# Patient Record
Sex: Female | Born: 1935 | Race: White | Hispanic: No | Smoking: Never smoker
Health system: Southern US, Community
[De-identification: ages and names within clinical notes are randomized; demographics above are authoritative.]

## PROBLEM LIST (undated history)

## (undated) DIAGNOSIS — S329XXA Fracture of unspecified parts of lumbosacral spine and pelvis, initial encounter for closed fracture: Secondary | ICD-10-CM

## (undated) DIAGNOSIS — D649 Anemia, unspecified: Secondary | ICD-10-CM

## (undated) DIAGNOSIS — N183 Chronic kidney disease, stage 3 unspecified: Secondary | ICD-10-CM

## (undated) DIAGNOSIS — S32000A Wedge compression fracture of unspecified lumbar vertebra, initial encounter for closed fracture: Secondary | ICD-10-CM

## (undated) DIAGNOSIS — I10 Essential (primary) hypertension: Secondary | ICD-10-CM

## (undated) DIAGNOSIS — F0390 Unspecified dementia without behavioral disturbance: Secondary | ICD-10-CM

## (undated) DIAGNOSIS — S22000A Wedge compression fracture of unspecified thoracic vertebra, initial encounter for closed fracture: Secondary | ICD-10-CM

## (undated) HISTORY — PX: TONSILLECTOMY: SUR1361

---

## 2005-09-04 HISTORY — PX: TOTAL HIP ARTHROPLASTY: SHX124

## 2013-09-15 DIAGNOSIS — R55 Syncope and collapse: Secondary | ICD-10-CM | POA: Diagnosis not present

## 2013-09-15 NOTE — Patient Instructions (Addendum)
PRESCRIPTION REFILL POLICY    [facility]   Statement to Patients  December 03, 2012      In an effort to ensure the large volume of patient prescription refills is processed in the most efficient and expeditious manner, we are asking our patients to assist us by calling your Pharmacy for all prescription refills, this will include also your  Mail Order Pharmacy. The pharmacy will contact our office electronically to continue the refill process.    Please do not wait until the last minute to call your pharmacy. We need at least 48 hours (2days) to fill prescriptions. We also encourage you to call your pharmacy before going to pick up your prescription to make sure it is ready.     With regard to controlled substance prescription refill requests (narcotic refills) that need to be picked up at our office, we ask your cooperation by providing us with at least 72 hours (3days) notice that you will need a refill.    We will not refill narcotic prescription refill requests after 4:00pm on any weekday, Monday through Thursday, or after 2:00pm on Fridays, or on the weekends.      We encourage everyone to explore another way of getting your prescription refill request processed using MyChart, our patient web portal through our electronic medical record system. MyChart is an efficient and effective way to communicate your medication request directly to the office and  downloadable as an app on your smart phone . MyChart also features a review functionality that allows you to view your medication list as well as leave messages for your physician. Are you ready to get connected? If so please review the attatched instructions or speak to any of our staff to get you set up right away!    Thank you so much for your cooperation. Should you have any questions please contact our Research officer, political partyractice Administrator.    The Physicians and Staff,  [facility]   This patient's chronological history has a better fit for a  micturition type syncope picture. Exam looks normal and we talked about some possible interventions, so this is less likely to occur in the future.

## 2013-09-15 NOTE — Progress Notes (Signed)
Neurology Consult      Subjective:      Kristina Potter is a 80 y.o. female Caucasian who comes in with the following focused history. She said on 20 December last month, she had been very preoccupied with Christmas in terms of meal preparation and entertaining etc. That night she was getting ready to go to bed and thought it smart and prudent that she go to the bathroom before she retired. She made it to the bathroom and on the toilet and leaned forward and fell to the floor. She said in retrospect, she felt like she was going to pass out and she called out to her son when she found that she couldn't immediately get up. When her son came in she said she felt nauseated, but there was no involuntary movement or tongue biting or loss of bowel and bladder incontinence while on the floor. Was appropriate with her cognition at the time and knew who she was where she was and mostly what had transpired. The son called EMS but they evaluated her and because of her improvement to baseline she elected to stay home. She recalls an isolated event as a teenager where she fainted and on one other occasion when she bent over she tended to topple but found it easier getting back on her feet. Has noted that she needs to sit up on the bed to get her bearings before she sets off because of some immediate lightheadedness predisposition. She does have hypertension. Is on one medicine and the dose has not changed in recent months. She's also on pain medicine. There is no background  heart disease, diabetes, respiratory disease. She does not smoke and alcohol is very seldom and never been a problem. No prior history of TIA or CVA or seizure etc...         Current Outpatient Prescriptions   Medication Sig Dispense Refill   ??? benazepril-hydrochlorothiazide (LOTENSIN HCT) 20-12.5 mg per tablet Take  by mouth daily.       ??? Calcium Carbonate-Vit D3-Min (CALTRATE 600+D PLUS MINERALS) 600 mg calcium- 400 unit tab Take  by mouth.       ???  aspirin (ST JOSEPH ASPIRIN) 81 mg chewable tablet Take 81 mg by mouth daily.       ??? acetaminophen (TYLENOL EXTRA STRENGTH) 500 mg tablet Take  by mouth every six (6) hours as needed for Pain.       ??? acetaminophen (TYLENOL ARTHRITIS PAIN) 650 mg CR tablet Take 650 mg by mouth every six (6) hours as needed for Pain.       ??? cephALEXin (KEFLEX) 500 mg capsule Take 500 mg by mouth four (4) times daily.          Allergies   Allergen Reactions   ??? Doxycycline Unknown (comments)     Past Medical History   Diagnosis Date   ??? Muscle weakness    ??? Leg swelling    ??? Joint pain    ??? Spinal stenosis       Past Surgical History   Procedure Laterality Date   ??? Hx tonsillectomy     ??? Hx hip replacement     ??? Hx orthopaedic        History     Social History   ??? Marital Status: WIDOWED     Spouse Name: N/A     Number of Children: N/A   ??? Years of Education: N/A     Occupational History   ???  Not on file.     Social History Main Topics   ??? Smoking status: Never Smoker    ??? Smokeless tobacco: Never Used   ??? Alcohol Use: 1.5 oz/week     3 Glasses of wine per week   ??? Drug Use: No   ??? Sexually Active: Not on file     Other Topics Concern   ??? Not on file     Social History Narrative   ??? No narrative on file      Family History   Problem Relation Age of Onset   ??? Stroke Father    ??? Heart Disease Paternal Grandfather       BP 152/80   Pulse 94   Temp(Src) 98.2 ??F (36.8 ??C)   Ht 5' (1.524 m)   Wt 38.51 kg (84 lb 14.4 oz)   BMI 16.58 kg/m2   SpO2 98%     Review of Systems:   A comprehensive review of systems was negative except for that written in the HPI.      Neuro Exam:     Appearance:  The patient is well developed, well nourished, provides a coherent history and is in no acute distress.   Mental Status: Oriented to time, place and person. Mood and affect appropriate.   Cranial Nerves:   Intact visual fields. Fundi are benign. PERLA, EOM's full, no nystagmus, no ptosis. Facial sensation is normal. Corneal reflexes are intact. Facial  movement is symmetric. Hearing is normal bilaterally. Palate is midline with normal sternocleidomastoid and trapezius muscles are normal. Tongue is midline.   Motor:  5/5 strength in upper and lower proximal and distal muscles. Normal bulk and tone. No fasciculations.   Reflexes:   Deep tendon reflexes 1-2+/4 and symmetrical.   Sensory:   Normal to touch, pinprick and vibration.   Gait:  Normal gait. Romberg negative.   Tremor:   No tremor noted.   Cerebellar:  No cerebellar signs present.   Neurovascular:  Normal heart sounds and regular rhythm, peripheral pulses intact, and no carotid bruits.            Assessment:   Micturition syncope. This isolated event with the details sounds like a best fit at this time. I do not sincerely believe this was a TIA or a seizure in retrospect. The patient was offered investigational testing such as head imaging and EEG if there was intense anxiety about the episode. Patient politely declined the offer.       Plan:   Revisit as needed.  Signed by :  Milana NaJOHN J Nimrod Wendt IV, MD 09/15/2013

## 2013-09-15 NOTE — Progress Notes (Signed)
New patient present today with complaint of a fall on12-20-14. Reports the fall occurred in the bathroom. Stated her son told her she did not respond for a couple of minutes.

## 2014-02-10 DIAGNOSIS — Z0000 Encounter for general adult medical examination without abnormal findings: Secondary | ICD-10-CM | POA: Diagnosis not present

## 2014-03-25 DIAGNOSIS — H26499 Other secondary cataract, unspecified eye: Secondary | ICD-10-CM | POA: Diagnosis not present

## 2014-05-14 DIAGNOSIS — M4800 Spinal stenosis, site unspecified: Secondary | ICD-10-CM | POA: Diagnosis not present

## 2014-05-22 DIAGNOSIS — E7800 Pure hypercholesterolemia, unspecified: Secondary | ICD-10-CM | POA: Diagnosis not present

## 2014-05-22 DIAGNOSIS — I10 Essential (primary) hypertension: Secondary | ICD-10-CM | POA: Diagnosis not present

## 2014-05-26 DIAGNOSIS — M2560 Stiffness of unspecified joint, not elsewhere classified: Secondary | ICD-10-CM | POA: Diagnosis not present

## 2014-05-26 DIAGNOSIS — M25559 Pain in unspecified hip: Secondary | ICD-10-CM | POA: Diagnosis not present

## 2014-05-26 DIAGNOSIS — M545 Low back pain, unspecified: Secondary | ICD-10-CM | POA: Diagnosis not present

## 2014-05-29 DIAGNOSIS — Z043 Encounter for examination and observation following other accident: Secondary | ICD-10-CM | POA: Diagnosis not present

## 2014-05-29 DIAGNOSIS — D696 Thrombocytopenia, unspecified: Secondary | ICD-10-CM | POA: Diagnosis present

## 2014-05-29 DIAGNOSIS — D709 Neutropenia, unspecified: Secondary | ICD-10-CM | POA: Diagnosis not present

## 2014-05-29 DIAGNOSIS — S0990XA Unspecified injury of head, initial encounter: Secondary | ICD-10-CM | POA: Diagnosis not present

## 2014-05-29 DIAGNOSIS — T07XXXA Unspecified multiple injuries, initial encounter: Secondary | ICD-10-CM | POA: Diagnosis not present

## 2014-05-29 DIAGNOSIS — S8290XD Unspecified fracture of unspecified lower leg, subsequent encounter for closed fracture with routine healing: Secondary | ICD-10-CM | POA: Diagnosis not present

## 2014-05-29 DIAGNOSIS — IMO0002 Reserved for concepts with insufficient information to code with codable children: Secondary | ICD-10-CM | POA: Diagnosis not present

## 2014-05-29 DIAGNOSIS — D649 Anemia, unspecified: Secondary | ICD-10-CM | POA: Diagnosis not present

## 2014-05-29 DIAGNOSIS — S82001D Unspecified fracture of right patella, subsequent encounter for closed fracture with routine healing: Secondary | ICD-10-CM | POA: Diagnosis not present

## 2014-05-29 DIAGNOSIS — M545 Low back pain, unspecified: Secondary | ICD-10-CM | POA: Diagnosis not present

## 2014-05-29 DIAGNOSIS — S82843A Displaced bimalleolar fracture of unspecified lower leg, initial encounter for closed fracture: Secondary | ICD-10-CM | POA: Diagnosis not present

## 2014-05-29 DIAGNOSIS — M549 Dorsalgia, unspecified: Secondary | ICD-10-CM | POA: Diagnosis not present

## 2014-05-29 DIAGNOSIS — R0789 Other chest pain: Secondary | ICD-10-CM | POA: Diagnosis not present

## 2014-05-29 DIAGNOSIS — R9431 Abnormal electrocardiogram [ECG] [EKG]: Secondary | ICD-10-CM | POA: Diagnosis not present

## 2014-05-29 DIAGNOSIS — E871 Hypo-osmolality and hyponatremia: Secondary | ICD-10-CM | POA: Diagnosis not present

## 2014-05-29 DIAGNOSIS — S81009A Unspecified open wound, unspecified knee, initial encounter: Secondary | ICD-10-CM | POA: Diagnosis not present

## 2014-05-29 DIAGNOSIS — K5730 Diverticulosis of large intestine without perforation or abscess without bleeding: Secondary | ICD-10-CM | POA: Diagnosis not present

## 2014-05-29 DIAGNOSIS — R488 Other symbolic dysfunctions: Secondary | ICD-10-CM | POA: Diagnosis not present

## 2014-05-29 DIAGNOSIS — R262 Difficulty in walking, not elsewhere classified: Secondary | ICD-10-CM | POA: Diagnosis not present

## 2014-05-29 DIAGNOSIS — S82009A Unspecified fracture of unspecified patella, initial encounter for closed fracture: Secondary | ICD-10-CM | POA: Diagnosis not present

## 2014-05-29 DIAGNOSIS — S8263XA Displaced fracture of lateral malleolus of unspecified fibula, initial encounter for closed fracture: Secondary | ICD-10-CM | POA: Diagnosis not present

## 2014-05-29 DIAGNOSIS — M79609 Pain in unspecified limb: Secondary | ICD-10-CM | POA: Diagnosis not present

## 2014-05-29 DIAGNOSIS — M2560 Stiffness of unspecified joint, not elsewhere classified: Secondary | ICD-10-CM | POA: Diagnosis not present

## 2014-05-29 DIAGNOSIS — S0100XA Unspecified open wound of scalp, initial encounter: Secondary | ICD-10-CM | POA: Diagnosis not present

## 2014-05-29 DIAGNOSIS — M25559 Pain in unspecified hip: Secondary | ICD-10-CM | POA: Diagnosis not present

## 2014-05-29 DIAGNOSIS — S8253XA Displaced fracture of medial malleolus of unspecified tibia, initial encounter for closed fracture: Secondary | ICD-10-CM | POA: Diagnosis not present

## 2014-05-29 DIAGNOSIS — M6281 Muscle weakness (generalized): Secondary | ICD-10-CM | POA: Diagnosis not present

## 2014-05-29 DIAGNOSIS — I499 Cardiac arrhythmia, unspecified: Secondary | ICD-10-CM | POA: Diagnosis not present

## 2014-05-29 DIAGNOSIS — I10 Essential (primary) hypertension: Secondary | ICD-10-CM | POA: Diagnosis not present

## 2014-05-29 DIAGNOSIS — T1490XA Injury, unspecified, initial encounter: Secondary | ICD-10-CM | POA: Diagnosis not present

## 2014-05-29 DIAGNOSIS — S81809A Unspecified open wound, unspecified lower leg, initial encounter: Secondary | ICD-10-CM | POA: Diagnosis not present

## 2014-05-29 DIAGNOSIS — D518 Other vitamin B12 deficiency anemias: Secondary | ICD-10-CM | POA: Diagnosis present

## 2014-05-29 DIAGNOSIS — I2119 ST elevation (STEMI) myocardial infarction involving other coronary artery of inferior wall: Secondary | ICD-10-CM | POA: Diagnosis not present

## 2014-06-02 DIAGNOSIS — S8290XD Unspecified fracture of unspecified lower leg, subsequent encounter for closed fracture with routine healing: Secondary | ICD-10-CM | POA: Diagnosis not present

## 2014-06-02 DIAGNOSIS — M6281 Muscle weakness (generalized): Secondary | ICD-10-CM | POA: Diagnosis not present

## 2014-06-02 DIAGNOSIS — S82009A Unspecified fracture of unspecified patella, initial encounter for closed fracture: Secondary | ICD-10-CM | POA: Diagnosis not present

## 2014-06-02 DIAGNOSIS — S82045D Nondisplaced comminuted fracture of left patella, subsequent encounter for closed fracture with routine healing: Secondary | ICD-10-CM | POA: Diagnosis not present

## 2014-06-02 DIAGNOSIS — S82843A Displaced bimalleolar fracture of unspecified lower leg, initial encounter for closed fracture: Secondary | ICD-10-CM | POA: Diagnosis not present

## 2014-06-02 DIAGNOSIS — D539 Nutritional anemia, unspecified: Secondary | ICD-10-CM | POA: Diagnosis not present

## 2014-06-02 DIAGNOSIS — M549 Dorsalgia, unspecified: Secondary | ICD-10-CM | POA: Diagnosis not present

## 2014-06-02 DIAGNOSIS — R262 Difficulty in walking, not elsewhere classified: Secondary | ICD-10-CM | POA: Diagnosis not present

## 2014-06-02 DIAGNOSIS — D696 Thrombocytopenia, unspecified: Secondary | ICD-10-CM | POA: Diagnosis not present

## 2014-06-02 DIAGNOSIS — D649 Anemia, unspecified: Secondary | ICD-10-CM | POA: Diagnosis not present

## 2014-06-02 DIAGNOSIS — S8290XA Unspecified fracture of unspecified lower leg, initial encounter for closed fracture: Secondary | ICD-10-CM | POA: Diagnosis not present

## 2014-06-02 DIAGNOSIS — S82842D Displaced bimalleolar fracture of left lower leg, subsequent encounter for closed fracture with routine healing: Secondary | ICD-10-CM | POA: Diagnosis not present

## 2014-06-02 DIAGNOSIS — R7611 Nonspecific reaction to tuberculin skin test without active tuberculosis: Secondary | ICD-10-CM | POA: Diagnosis not present

## 2014-06-02 DIAGNOSIS — I10 Essential (primary) hypertension: Secondary | ICD-10-CM | POA: Diagnosis not present

## 2014-06-02 DIAGNOSIS — D709 Neutropenia, unspecified: Secondary | ICD-10-CM | POA: Diagnosis not present

## 2014-06-02 DIAGNOSIS — E871 Hypo-osmolality and hyponatremia: Secondary | ICD-10-CM | POA: Diagnosis not present

## 2014-06-02 DIAGNOSIS — R488 Other symbolic dysfunctions: Secondary | ICD-10-CM | POA: Diagnosis not present

## 2014-06-02 DIAGNOSIS — S82001D Unspecified fracture of right patella, subsequent encounter for closed fracture with routine healing: Secondary | ICD-10-CM | POA: Diagnosis not present

## 2014-06-03 DIAGNOSIS — S8290XD Unspecified fracture of unspecified lower leg, subsequent encounter for closed fracture with routine healing: Secondary | ICD-10-CM | POA: Diagnosis not present

## 2014-06-04 DIAGNOSIS — S82045D Nondisplaced comminuted fracture of left patella, subsequent encounter for closed fracture with routine healing: Secondary | ICD-10-CM | POA: Diagnosis not present

## 2014-06-04 DIAGNOSIS — S82842D Displaced bimalleolar fracture of left lower leg, subsequent encounter for closed fracture with routine healing: Secondary | ICD-10-CM | POA: Diagnosis not present

## 2014-06-11 DIAGNOSIS — S8290XA Unspecified fracture of unspecified lower leg, initial encounter for closed fracture: Secondary | ICD-10-CM | POA: Diagnosis not present

## 2014-06-25 DIAGNOSIS — D539 Nutritional anemia, unspecified: Secondary | ICD-10-CM | POA: Diagnosis not present

## 2014-06-25 DIAGNOSIS — D696 Thrombocytopenia, unspecified: Secondary | ICD-10-CM | POA: Diagnosis not present

## 2014-06-30 DIAGNOSIS — S8290XA Unspecified fracture of unspecified lower leg, initial encounter for closed fracture: Secondary | ICD-10-CM | POA: Diagnosis not present

## 2014-07-02 DIAGNOSIS — S82045D Nondisplaced comminuted fracture of left patella, subsequent encounter for closed fracture with routine healing: Secondary | ICD-10-CM | POA: Diagnosis not present

## 2014-07-02 DIAGNOSIS — S82842D Displaced bimalleolar fracture of left lower leg, subsequent encounter for closed fracture with routine healing: Secondary | ICD-10-CM | POA: Diagnosis not present

## 2014-07-15 DIAGNOSIS — S82045D Nondisplaced comminuted fracture of left patella, subsequent encounter for closed fracture with routine healing: Secondary | ICD-10-CM | POA: Diagnosis not present

## 2014-07-15 DIAGNOSIS — S82842D Displaced bimalleolar fracture of left lower leg, subsequent encounter for closed fracture with routine healing: Secondary | ICD-10-CM | POA: Diagnosis not present

## 2014-07-17 DIAGNOSIS — S8290XA Unspecified fracture of unspecified lower leg, initial encounter for closed fracture: Secondary | ICD-10-CM | POA: Diagnosis not present

## 2014-08-18 DIAGNOSIS — S8262XD Displaced fracture of lateral malleolus of left fibula, subsequent encounter for closed fracture with routine healing: Secondary | ICD-10-CM | POA: Diagnosis not present

## 2014-08-18 DIAGNOSIS — Z5189 Encounter for other specified aftercare: Secondary | ICD-10-CM | POA: Diagnosis not present

## 2014-08-18 DIAGNOSIS — R2681 Unsteadiness on feet: Secondary | ICD-10-CM | POA: Diagnosis not present

## 2014-08-18 DIAGNOSIS — M6281 Muscle weakness (generalized): Secondary | ICD-10-CM | POA: Diagnosis not present

## 2014-08-18 DIAGNOSIS — S8252XD Displaced fracture of medial malleolus of left tibia, subsequent encounter for closed fracture with routine healing: Secondary | ICD-10-CM | POA: Diagnosis not present

## 2014-08-18 DIAGNOSIS — S82012D Displaced osteochondral fracture of left patella, subsequent encounter for closed fracture with routine healing: Secondary | ICD-10-CM | POA: Diagnosis not present

## 2014-08-21 DIAGNOSIS — M6281 Muscle weakness (generalized): Secondary | ICD-10-CM | POA: Diagnosis not present

## 2014-08-21 DIAGNOSIS — S82012D Displaced osteochondral fracture of left patella, subsequent encounter for closed fracture with routine healing: Secondary | ICD-10-CM | POA: Diagnosis not present

## 2014-08-21 DIAGNOSIS — R2681 Unsteadiness on feet: Secondary | ICD-10-CM | POA: Diagnosis not present

## 2014-08-21 DIAGNOSIS — Z5189 Encounter for other specified aftercare: Secondary | ICD-10-CM | POA: Diagnosis not present

## 2014-08-21 DIAGNOSIS — S8252XD Displaced fracture of medial malleolus of left tibia, subsequent encounter for closed fracture with routine healing: Secondary | ICD-10-CM | POA: Diagnosis not present

## 2014-08-21 DIAGNOSIS — S8262XD Displaced fracture of lateral malleolus of left fibula, subsequent encounter for closed fracture with routine healing: Secondary | ICD-10-CM | POA: Diagnosis not present

## 2014-08-24 DIAGNOSIS — S8262XD Displaced fracture of lateral malleolus of left fibula, subsequent encounter for closed fracture with routine healing: Secondary | ICD-10-CM | POA: Diagnosis not present

## 2014-08-24 DIAGNOSIS — M6281 Muscle weakness (generalized): Secondary | ICD-10-CM | POA: Diagnosis not present

## 2014-08-24 DIAGNOSIS — S82012D Displaced osteochondral fracture of left patella, subsequent encounter for closed fracture with routine healing: Secondary | ICD-10-CM | POA: Diagnosis not present

## 2014-08-24 DIAGNOSIS — S8252XD Displaced fracture of medial malleolus of left tibia, subsequent encounter for closed fracture with routine healing: Secondary | ICD-10-CM | POA: Diagnosis not present

## 2014-08-24 DIAGNOSIS — R2681 Unsteadiness on feet: Secondary | ICD-10-CM | POA: Diagnosis not present

## 2014-08-24 DIAGNOSIS — Z5189 Encounter for other specified aftercare: Secondary | ICD-10-CM | POA: Diagnosis not present

## 2014-08-25 DIAGNOSIS — R2681 Unsteadiness on feet: Secondary | ICD-10-CM | POA: Diagnosis not present

## 2014-08-25 DIAGNOSIS — M6281 Muscle weakness (generalized): Secondary | ICD-10-CM | POA: Diagnosis not present

## 2014-08-25 DIAGNOSIS — Z5189 Encounter for other specified aftercare: Secondary | ICD-10-CM | POA: Diagnosis not present

## 2014-08-25 DIAGNOSIS — S8262XD Displaced fracture of lateral malleolus of left fibula, subsequent encounter for closed fracture with routine healing: Secondary | ICD-10-CM | POA: Diagnosis not present

## 2014-08-25 DIAGNOSIS — S8252XD Displaced fracture of medial malleolus of left tibia, subsequent encounter for closed fracture with routine healing: Secondary | ICD-10-CM | POA: Diagnosis not present

## 2014-08-25 DIAGNOSIS — S82012D Displaced osteochondral fracture of left patella, subsequent encounter for closed fracture with routine healing: Secondary | ICD-10-CM | POA: Diagnosis not present

## 2014-08-31 DIAGNOSIS — Z5189 Encounter for other specified aftercare: Secondary | ICD-10-CM | POA: Diagnosis not present

## 2014-08-31 DIAGNOSIS — S8252XD Displaced fracture of medial malleolus of left tibia, subsequent encounter for closed fracture with routine healing: Secondary | ICD-10-CM | POA: Diagnosis not present

## 2014-08-31 DIAGNOSIS — M6281 Muscle weakness (generalized): Secondary | ICD-10-CM | POA: Diagnosis not present

## 2014-08-31 DIAGNOSIS — R2681 Unsteadiness on feet: Secondary | ICD-10-CM | POA: Diagnosis not present

## 2014-08-31 DIAGNOSIS — S82012D Displaced osteochondral fracture of left patella, subsequent encounter for closed fracture with routine healing: Secondary | ICD-10-CM | POA: Diagnosis not present

## 2014-08-31 DIAGNOSIS — S8262XD Displaced fracture of lateral malleolus of left fibula, subsequent encounter for closed fracture with routine healing: Secondary | ICD-10-CM | POA: Diagnosis not present

## 2014-09-02 DIAGNOSIS — S82012D Displaced osteochondral fracture of left patella, subsequent encounter for closed fracture with routine healing: Secondary | ICD-10-CM | POA: Diagnosis not present

## 2014-09-02 DIAGNOSIS — S8252XD Displaced fracture of medial malleolus of left tibia, subsequent encounter for closed fracture with routine healing: Secondary | ICD-10-CM | POA: Diagnosis not present

## 2014-09-02 DIAGNOSIS — R2681 Unsteadiness on feet: Secondary | ICD-10-CM | POA: Diagnosis not present

## 2014-09-02 DIAGNOSIS — M6281 Muscle weakness (generalized): Secondary | ICD-10-CM | POA: Diagnosis not present

## 2014-09-02 DIAGNOSIS — Z5189 Encounter for other specified aftercare: Secondary | ICD-10-CM | POA: Diagnosis not present

## 2014-09-02 DIAGNOSIS — S8262XD Displaced fracture of lateral malleolus of left fibula, subsequent encounter for closed fracture with routine healing: Secondary | ICD-10-CM | POA: Diagnosis not present

## 2014-09-07 DIAGNOSIS — S82012D Displaced osteochondral fracture of left patella, subsequent encounter for closed fracture with routine healing: Secondary | ICD-10-CM | POA: Diagnosis not present

## 2014-09-07 DIAGNOSIS — M6281 Muscle weakness (generalized): Secondary | ICD-10-CM | POA: Diagnosis not present

## 2014-09-07 DIAGNOSIS — R2681 Unsteadiness on feet: Secondary | ICD-10-CM | POA: Diagnosis not present

## 2014-09-07 DIAGNOSIS — Z5189 Encounter for other specified aftercare: Secondary | ICD-10-CM | POA: Diagnosis not present

## 2014-09-07 DIAGNOSIS — S8252XD Displaced fracture of medial malleolus of left tibia, subsequent encounter for closed fracture with routine healing: Secondary | ICD-10-CM | POA: Diagnosis not present

## 2014-09-07 DIAGNOSIS — S8262XD Displaced fracture of lateral malleolus of left fibula, subsequent encounter for closed fracture with routine healing: Secondary | ICD-10-CM | POA: Diagnosis not present

## 2014-09-09 DIAGNOSIS — M6281 Muscle weakness (generalized): Secondary | ICD-10-CM | POA: Diagnosis not present

## 2014-09-09 DIAGNOSIS — S8252XD Displaced fracture of medial malleolus of left tibia, subsequent encounter for closed fracture with routine healing: Secondary | ICD-10-CM | POA: Diagnosis not present

## 2014-09-09 DIAGNOSIS — S82012D Displaced osteochondral fracture of left patella, subsequent encounter for closed fracture with routine healing: Secondary | ICD-10-CM | POA: Diagnosis not present

## 2014-09-09 DIAGNOSIS — S8262XD Displaced fracture of lateral malleolus of left fibula, subsequent encounter for closed fracture with routine healing: Secondary | ICD-10-CM | POA: Diagnosis not present

## 2014-09-09 DIAGNOSIS — Z5189 Encounter for other specified aftercare: Secondary | ICD-10-CM | POA: Diagnosis not present

## 2014-09-09 DIAGNOSIS — R2681 Unsteadiness on feet: Secondary | ICD-10-CM | POA: Diagnosis not present

## 2014-09-15 DIAGNOSIS — S8262XD Displaced fracture of lateral malleolus of left fibula, subsequent encounter for closed fracture with routine healing: Secondary | ICD-10-CM | POA: Diagnosis not present

## 2014-09-15 DIAGNOSIS — S82012D Displaced osteochondral fracture of left patella, subsequent encounter for closed fracture with routine healing: Secondary | ICD-10-CM | POA: Diagnosis not present

## 2014-09-15 DIAGNOSIS — R2681 Unsteadiness on feet: Secondary | ICD-10-CM | POA: Diagnosis not present

## 2014-09-15 DIAGNOSIS — S8252XD Displaced fracture of medial malleolus of left tibia, subsequent encounter for closed fracture with routine healing: Secondary | ICD-10-CM | POA: Diagnosis not present

## 2014-09-15 DIAGNOSIS — Z5189 Encounter for other specified aftercare: Secondary | ICD-10-CM | POA: Diagnosis not present

## 2014-09-15 DIAGNOSIS — M6281 Muscle weakness (generalized): Secondary | ICD-10-CM | POA: Diagnosis not present

## 2014-09-17 DIAGNOSIS — S8252XD Displaced fracture of medial malleolus of left tibia, subsequent encounter for closed fracture with routine healing: Secondary | ICD-10-CM | POA: Diagnosis not present

## 2014-09-17 DIAGNOSIS — S8262XD Displaced fracture of lateral malleolus of left fibula, subsequent encounter for closed fracture with routine healing: Secondary | ICD-10-CM | POA: Diagnosis not present

## 2014-09-17 DIAGNOSIS — R2681 Unsteadiness on feet: Secondary | ICD-10-CM | POA: Diagnosis not present

## 2014-09-17 DIAGNOSIS — S82012D Displaced osteochondral fracture of left patella, subsequent encounter for closed fracture with routine healing: Secondary | ICD-10-CM | POA: Diagnosis not present

## 2014-09-17 DIAGNOSIS — Z5189 Encounter for other specified aftercare: Secondary | ICD-10-CM | POA: Diagnosis not present

## 2014-09-17 DIAGNOSIS — M6281 Muscle weakness (generalized): Secondary | ICD-10-CM | POA: Diagnosis not present

## 2014-09-22 DIAGNOSIS — S8252XD Displaced fracture of medial malleolus of left tibia, subsequent encounter for closed fracture with routine healing: Secondary | ICD-10-CM | POA: Diagnosis not present

## 2014-09-22 DIAGNOSIS — R2681 Unsteadiness on feet: Secondary | ICD-10-CM | POA: Diagnosis not present

## 2014-09-22 DIAGNOSIS — S82012D Displaced osteochondral fracture of left patella, subsequent encounter for closed fracture with routine healing: Secondary | ICD-10-CM | POA: Diagnosis not present

## 2014-09-22 DIAGNOSIS — Z5189 Encounter for other specified aftercare: Secondary | ICD-10-CM | POA: Diagnosis not present

## 2014-09-22 DIAGNOSIS — S8262XD Displaced fracture of lateral malleolus of left fibula, subsequent encounter for closed fracture with routine healing: Secondary | ICD-10-CM | POA: Diagnosis not present

## 2014-09-22 DIAGNOSIS — M6281 Muscle weakness (generalized): Secondary | ICD-10-CM | POA: Diagnosis not present

## 2014-09-25 DIAGNOSIS — R2681 Unsteadiness on feet: Secondary | ICD-10-CM | POA: Diagnosis not present

## 2014-09-25 DIAGNOSIS — M6281 Muscle weakness (generalized): Secondary | ICD-10-CM | POA: Diagnosis not present

## 2014-09-25 DIAGNOSIS — Z5189 Encounter for other specified aftercare: Secondary | ICD-10-CM | POA: Diagnosis not present

## 2014-09-25 DIAGNOSIS — S8262XD Displaced fracture of lateral malleolus of left fibula, subsequent encounter for closed fracture with routine healing: Secondary | ICD-10-CM | POA: Diagnosis not present

## 2014-09-25 DIAGNOSIS — S8252XD Displaced fracture of medial malleolus of left tibia, subsequent encounter for closed fracture with routine healing: Secondary | ICD-10-CM | POA: Diagnosis not present

## 2014-09-25 DIAGNOSIS — S82012D Displaced osteochondral fracture of left patella, subsequent encounter for closed fracture with routine healing: Secondary | ICD-10-CM | POA: Diagnosis not present

## 2014-09-29 DIAGNOSIS — S8252XD Displaced fracture of medial malleolus of left tibia, subsequent encounter for closed fracture with routine healing: Secondary | ICD-10-CM | POA: Diagnosis not present

## 2014-09-29 DIAGNOSIS — M6281 Muscle weakness (generalized): Secondary | ICD-10-CM | POA: Diagnosis not present

## 2014-09-29 DIAGNOSIS — Z5189 Encounter for other specified aftercare: Secondary | ICD-10-CM | POA: Diagnosis not present

## 2014-09-29 DIAGNOSIS — R2681 Unsteadiness on feet: Secondary | ICD-10-CM | POA: Diagnosis not present

## 2014-09-29 DIAGNOSIS — S82012D Displaced osteochondral fracture of left patella, subsequent encounter for closed fracture with routine healing: Secondary | ICD-10-CM | POA: Diagnosis not present

## 2014-09-29 DIAGNOSIS — S8262XD Displaced fracture of lateral malleolus of left fibula, subsequent encounter for closed fracture with routine healing: Secondary | ICD-10-CM | POA: Diagnosis not present

## 2014-10-01 DIAGNOSIS — S8262XD Displaced fracture of lateral malleolus of left fibula, subsequent encounter for closed fracture with routine healing: Secondary | ICD-10-CM | POA: Diagnosis not present

## 2014-10-01 DIAGNOSIS — Z5189 Encounter for other specified aftercare: Secondary | ICD-10-CM | POA: Diagnosis not present

## 2014-10-01 DIAGNOSIS — M6281 Muscle weakness (generalized): Secondary | ICD-10-CM | POA: Diagnosis not present

## 2014-10-01 DIAGNOSIS — S82012D Displaced osteochondral fracture of left patella, subsequent encounter for closed fracture with routine healing: Secondary | ICD-10-CM | POA: Diagnosis not present

## 2014-10-01 DIAGNOSIS — S8252XD Displaced fracture of medial malleolus of left tibia, subsequent encounter for closed fracture with routine healing: Secondary | ICD-10-CM | POA: Diagnosis not present

## 2014-10-01 DIAGNOSIS — R2681 Unsteadiness on feet: Secondary | ICD-10-CM | POA: Diagnosis not present

## 2014-10-06 DIAGNOSIS — S82012D Displaced osteochondral fracture of left patella, subsequent encounter for closed fracture with routine healing: Secondary | ICD-10-CM | POA: Diagnosis not present

## 2014-10-06 DIAGNOSIS — Z5189 Encounter for other specified aftercare: Secondary | ICD-10-CM | POA: Diagnosis not present

## 2014-10-06 DIAGNOSIS — M6281 Muscle weakness (generalized): Secondary | ICD-10-CM | POA: Diagnosis not present

## 2014-10-06 DIAGNOSIS — R2681 Unsteadiness on feet: Secondary | ICD-10-CM | POA: Diagnosis not present

## 2014-10-06 DIAGNOSIS — S8252XD Displaced fracture of medial malleolus of left tibia, subsequent encounter for closed fracture with routine healing: Secondary | ICD-10-CM | POA: Diagnosis not present

## 2014-10-06 DIAGNOSIS — S8262XD Displaced fracture of lateral malleolus of left fibula, subsequent encounter for closed fracture with routine healing: Secondary | ICD-10-CM | POA: Diagnosis not present

## 2014-10-08 DIAGNOSIS — S8252XD Displaced fracture of medial malleolus of left tibia, subsequent encounter for closed fracture with routine healing: Secondary | ICD-10-CM | POA: Diagnosis not present

## 2014-10-08 DIAGNOSIS — M6281 Muscle weakness (generalized): Secondary | ICD-10-CM | POA: Diagnosis not present

## 2014-10-08 DIAGNOSIS — R2681 Unsteadiness on feet: Secondary | ICD-10-CM | POA: Diagnosis not present

## 2014-10-08 DIAGNOSIS — S8262XD Displaced fracture of lateral malleolus of left fibula, subsequent encounter for closed fracture with routine healing: Secondary | ICD-10-CM | POA: Diagnosis not present

## 2014-10-08 DIAGNOSIS — S82012D Displaced osteochondral fracture of left patella, subsequent encounter for closed fracture with routine healing: Secondary | ICD-10-CM | POA: Diagnosis not present

## 2014-10-08 DIAGNOSIS — Z5189 Encounter for other specified aftercare: Secondary | ICD-10-CM | POA: Diagnosis not present

## 2014-10-12 DIAGNOSIS — R946 Abnormal results of thyroid function studies: Secondary | ICD-10-CM | POA: Diagnosis not present

## 2014-10-12 DIAGNOSIS — E559 Vitamin D deficiency, unspecified: Secondary | ICD-10-CM | POA: Diagnosis not present

## 2014-10-12 DIAGNOSIS — I10 Essential (primary) hypertension: Secondary | ICD-10-CM | POA: Diagnosis not present

## 2014-10-12 DIAGNOSIS — D649 Anemia, unspecified: Secondary | ICD-10-CM | POA: Diagnosis not present

## 2014-10-12 DIAGNOSIS — M4806 Spinal stenosis, lumbar region: Secondary | ICD-10-CM | POA: Diagnosis not present

## 2014-10-12 DIAGNOSIS — E538 Deficiency of other specified B group vitamins: Secondary | ICD-10-CM | POA: Diagnosis not present

## 2014-10-12 DIAGNOSIS — R011 Cardiac murmur, unspecified: Secondary | ICD-10-CM | POA: Diagnosis not present

## 2014-10-28 DIAGNOSIS — I10 Essential (primary) hypertension: Secondary | ICD-10-CM | POA: Diagnosis not present

## 2014-11-27 DIAGNOSIS — I10 Essential (primary) hypertension: Secondary | ICD-10-CM | POA: Diagnosis not present

## 2014-12-01 DIAGNOSIS — I10 Essential (primary) hypertension: Secondary | ICD-10-CM | POA: Diagnosis not present

## 2014-12-08 DIAGNOSIS — I10 Essential (primary) hypertension: Secondary | ICD-10-CM | POA: Diagnosis not present

## 2014-12-08 DIAGNOSIS — D649 Anemia, unspecified: Secondary | ICD-10-CM | POA: Diagnosis not present

## 2014-12-08 DIAGNOSIS — R634 Abnormal weight loss: Secondary | ICD-10-CM | POA: Diagnosis not present

## 2015-01-11 DIAGNOSIS — D649 Anemia, unspecified: Secondary | ICD-10-CM | POA: Diagnosis not present

## 2015-01-11 DIAGNOSIS — I10 Essential (primary) hypertension: Secondary | ICD-10-CM | POA: Diagnosis not present

## 2015-01-11 DIAGNOSIS — E871 Hypo-osmolality and hyponatremia: Secondary | ICD-10-CM | POA: Diagnosis not present

## 2015-03-09 DIAGNOSIS — M549 Dorsalgia, unspecified: Secondary | ICD-10-CM | POA: Diagnosis present

## 2015-03-09 DIAGNOSIS — R531 Weakness: Secondary | ICD-10-CM | POA: Diagnosis not present

## 2015-03-09 DIAGNOSIS — Z66 Do not resuscitate: Secondary | ICD-10-CM | POA: Diagnosis not present

## 2015-03-09 DIAGNOSIS — S72012A Unspecified intracapsular fracture of left femur, initial encounter for closed fracture: Secondary | ICD-10-CM | POA: Diagnosis not present

## 2015-03-09 DIAGNOSIS — I10 Essential (primary) hypertension: Secondary | ICD-10-CM | POA: Diagnosis present

## 2015-03-09 DIAGNOSIS — S40812A Abrasion of left upper arm, initial encounter: Secondary | ICD-10-CM | POA: Diagnosis not present

## 2015-03-09 DIAGNOSIS — E871 Hypo-osmolality and hyponatremia: Secondary | ICD-10-CM | POA: Diagnosis present

## 2015-03-09 DIAGNOSIS — Z7982 Long term (current) use of aspirin: Secondary | ICD-10-CM | POA: Diagnosis not present

## 2015-03-09 DIAGNOSIS — S40811A Abrasion of right upper arm, initial encounter: Secondary | ICD-10-CM | POA: Diagnosis not present

## 2015-03-09 DIAGNOSIS — S72112A Displaced fracture of greater trochanter of left femur, initial encounter for closed fracture: Secondary | ICD-10-CM | POA: Diagnosis not present

## 2015-03-09 DIAGNOSIS — Z96642 Presence of left artificial hip joint: Secondary | ICD-10-CM | POA: Diagnosis present

## 2015-03-09 DIAGNOSIS — Z471 Aftercare following joint replacement surgery: Secondary | ICD-10-CM | POA: Diagnosis not present

## 2015-03-09 DIAGNOSIS — R41841 Cognitive communication deficit: Secondary | ICD-10-CM | POA: Diagnosis not present

## 2015-03-09 DIAGNOSIS — Z881 Allergy status to other antibiotic agents status: Secondary | ICD-10-CM | POA: Diagnosis not present

## 2015-03-09 DIAGNOSIS — M25552 Pain in left hip: Secondary | ICD-10-CM | POA: Diagnosis not present

## 2015-03-09 DIAGNOSIS — S72009A Fracture of unspecified part of neck of unspecified femur, initial encounter for closed fracture: Secondary | ICD-10-CM | POA: Diagnosis not present

## 2015-03-09 DIAGNOSIS — S72115A Nondisplaced fracture of greater trochanter of left femur, initial encounter for closed fracture: Secondary | ICD-10-CM | POA: Diagnosis not present

## 2015-03-09 DIAGNOSIS — S72002A Fracture of unspecified part of neck of left femur, initial encounter for closed fracture: Secondary | ICD-10-CM | POA: Diagnosis not present

## 2015-03-09 DIAGNOSIS — R262 Difficulty in walking, not elsewhere classified: Secondary | ICD-10-CM | POA: Diagnosis not present

## 2015-03-09 DIAGNOSIS — G8911 Acute pain due to trauma: Secondary | ICD-10-CM | POA: Diagnosis not present

## 2015-03-09 DIAGNOSIS — M6281 Muscle weakness (generalized): Secondary | ICD-10-CM | POA: Diagnosis not present

## 2015-03-09 DIAGNOSIS — G8929 Other chronic pain: Secondary | ICD-10-CM | POA: Diagnosis present

## 2015-03-09 DIAGNOSIS — W19XXXA Unspecified fall, initial encounter: Secondary | ICD-10-CM | POA: Diagnosis not present

## 2015-03-12 DIAGNOSIS — M6281 Muscle weakness (generalized): Secondary | ICD-10-CM | POA: Diagnosis not present

## 2015-03-12 DIAGNOSIS — E871 Hypo-osmolality and hyponatremia: Secondary | ICD-10-CM | POA: Diagnosis not present

## 2015-03-12 DIAGNOSIS — R319 Hematuria, unspecified: Secondary | ICD-10-CM | POA: Diagnosis not present

## 2015-03-12 DIAGNOSIS — R41841 Cognitive communication deficit: Secondary | ICD-10-CM | POA: Diagnosis not present

## 2015-03-12 DIAGNOSIS — S72002D Fracture of unspecified part of neck of left femur, subsequent encounter for closed fracture with routine healing: Secondary | ICD-10-CM | POA: Diagnosis not present

## 2015-03-12 DIAGNOSIS — R531 Weakness: Secondary | ICD-10-CM | POA: Diagnosis not present

## 2015-03-12 DIAGNOSIS — I10 Essential (primary) hypertension: Secondary | ICD-10-CM | POA: Diagnosis not present

## 2015-03-12 DIAGNOSIS — S72002A Fracture of unspecified part of neck of left femur, initial encounter for closed fracture: Secondary | ICD-10-CM | POA: Diagnosis not present

## 2015-03-12 DIAGNOSIS — Z471 Aftercare following joint replacement surgery: Secondary | ICD-10-CM | POA: Diagnosis not present

## 2015-03-12 DIAGNOSIS — R262 Difficulty in walking, not elsewhere classified: Secondary | ICD-10-CM | POA: Diagnosis not present

## 2015-03-12 DIAGNOSIS — S72009A Fracture of unspecified part of neck of unspecified femur, initial encounter for closed fracture: Secondary | ICD-10-CM | POA: Diagnosis not present

## 2015-03-12 DIAGNOSIS — S72114A Nondisplaced fracture of greater trochanter of right femur, initial encounter for closed fracture: Secondary | ICD-10-CM | POA: Diagnosis not present

## 2015-03-12 DIAGNOSIS — M4806 Spinal stenosis, lumbar region: Secondary | ICD-10-CM | POA: Diagnosis not present

## 2015-03-12 DIAGNOSIS — D649 Anemia, unspecified: Secondary | ICD-10-CM | POA: Diagnosis not present

## 2015-03-12 DIAGNOSIS — R54 Age-related physical debility: Secondary | ICD-10-CM | POA: Diagnosis not present

## 2015-03-12 DIAGNOSIS — S72012A Unspecified intracapsular fracture of left femur, initial encounter for closed fracture: Secondary | ICD-10-CM | POA: Diagnosis not present

## 2015-03-12 DIAGNOSIS — M25552 Pain in left hip: Secondary | ICD-10-CM | POA: Diagnosis not present

## 2015-03-12 DIAGNOSIS — S72114D Nondisplaced fracture of greater trochanter of right femur, subsequent encounter for closed fracture with routine healing: Secondary | ICD-10-CM | POA: Diagnosis not present

## 2015-03-12 DIAGNOSIS — R6884 Jaw pain: Secondary | ICD-10-CM | POA: Diagnosis not present

## 2015-03-13 DIAGNOSIS — R54 Age-related physical debility: Secondary | ICD-10-CM | POA: Diagnosis not present

## 2015-03-13 DIAGNOSIS — S72114A Nondisplaced fracture of greater trochanter of right femur, initial encounter for closed fracture: Secondary | ICD-10-CM | POA: Diagnosis not present

## 2015-03-15 DIAGNOSIS — S72114D Nondisplaced fracture of greater trochanter of right femur, subsequent encounter for closed fracture with routine healing: Secondary | ICD-10-CM | POA: Diagnosis not present

## 2015-03-15 DIAGNOSIS — R54 Age-related physical debility: Secondary | ICD-10-CM | POA: Diagnosis not present

## 2015-03-17 DIAGNOSIS — S72114D Nondisplaced fracture of greater trochanter of right femur, subsequent encounter for closed fracture with routine healing: Secondary | ICD-10-CM | POA: Diagnosis not present

## 2015-03-17 DIAGNOSIS — R54 Age-related physical debility: Secondary | ICD-10-CM | POA: Diagnosis not present

## 2015-03-26 DIAGNOSIS — S72002D Fracture of unspecified part of neck of left femur, subsequent encounter for closed fracture with routine healing: Secondary | ICD-10-CM | POA: Diagnosis not present

## 2015-03-26 DIAGNOSIS — S72002A Fracture of unspecified part of neck of left femur, initial encounter for closed fracture: Secondary | ICD-10-CM | POA: Diagnosis not present

## 2015-04-02 DIAGNOSIS — R6884 Jaw pain: Secondary | ICD-10-CM | POA: Diagnosis not present

## 2015-04-05 DIAGNOSIS — R6884 Jaw pain: Secondary | ICD-10-CM | POA: Diagnosis not present

## 2015-04-08 DIAGNOSIS — M25552 Pain in left hip: Secondary | ICD-10-CM | POA: Diagnosis not present

## 2015-04-09 DIAGNOSIS — R319 Hematuria, unspecified: Secondary | ICD-10-CM | POA: Diagnosis not present

## 2015-04-14 DIAGNOSIS — E871 Hypo-osmolality and hyponatremia: Secondary | ICD-10-CM | POA: Diagnosis not present

## 2015-04-14 DIAGNOSIS — M4806 Spinal stenosis, lumbar region: Secondary | ICD-10-CM | POA: Diagnosis not present

## 2015-04-14 DIAGNOSIS — I10 Essential (primary) hypertension: Secondary | ICD-10-CM | POA: Diagnosis not present

## 2015-04-14 DIAGNOSIS — D649 Anemia, unspecified: Secondary | ICD-10-CM | POA: Diagnosis not present

## 2015-04-26 DIAGNOSIS — I10 Essential (primary) hypertension: Secondary | ICD-10-CM | POA: Diagnosis not present

## 2015-04-27 DIAGNOSIS — S72002D Fracture of unspecified part of neck of left femur, subsequent encounter for closed fracture with routine healing: Secondary | ICD-10-CM | POA: Diagnosis not present

## 2015-05-04 DIAGNOSIS — M25552 Pain in left hip: Secondary | ICD-10-CM | POA: Diagnosis not present

## 2015-05-07 DIAGNOSIS — M545 Low back pain: Secondary | ICD-10-CM | POA: Diagnosis not present

## 2015-05-07 DIAGNOSIS — T84041D Periprosthetic fracture around internal prosthetic left hip joint, subsequent encounter: Secondary | ICD-10-CM | POA: Diagnosis not present

## 2015-05-07 DIAGNOSIS — R2689 Other abnormalities of gait and mobility: Secondary | ICD-10-CM | POA: Diagnosis not present

## 2015-05-07 DIAGNOSIS — I10 Essential (primary) hypertension: Secondary | ICD-10-CM | POA: Diagnosis not present

## 2015-05-07 DIAGNOSIS — Z9181 History of falling: Secondary | ICD-10-CM | POA: Diagnosis not present

## 2015-05-08 DIAGNOSIS — R2689 Other abnormalities of gait and mobility: Secondary | ICD-10-CM | POA: Diagnosis not present

## 2015-05-08 DIAGNOSIS — M545 Low back pain: Secondary | ICD-10-CM | POA: Diagnosis not present

## 2015-05-08 DIAGNOSIS — I10 Essential (primary) hypertension: Secondary | ICD-10-CM | POA: Diagnosis not present

## 2015-05-08 DIAGNOSIS — T84041D Periprosthetic fracture around internal prosthetic left hip joint, subsequent encounter: Secondary | ICD-10-CM | POA: Diagnosis not present

## 2015-05-08 DIAGNOSIS — Z9181 History of falling: Secondary | ICD-10-CM | POA: Diagnosis not present

## 2015-05-12 DIAGNOSIS — I10 Essential (primary) hypertension: Secondary | ICD-10-CM | POA: Diagnosis not present

## 2015-05-12 DIAGNOSIS — M545 Low back pain: Secondary | ICD-10-CM | POA: Diagnosis not present

## 2015-05-12 DIAGNOSIS — R2689 Other abnormalities of gait and mobility: Secondary | ICD-10-CM | POA: Diagnosis not present

## 2015-05-12 DIAGNOSIS — Z9181 History of falling: Secondary | ICD-10-CM | POA: Diagnosis not present

## 2015-05-12 DIAGNOSIS — T84041D Periprosthetic fracture around internal prosthetic left hip joint, subsequent encounter: Secondary | ICD-10-CM | POA: Diagnosis not present

## 2015-05-14 DIAGNOSIS — I10 Essential (primary) hypertension: Secondary | ICD-10-CM | POA: Diagnosis not present

## 2015-05-14 DIAGNOSIS — R2689 Other abnormalities of gait and mobility: Secondary | ICD-10-CM | POA: Diagnosis not present

## 2015-05-14 DIAGNOSIS — Z9181 History of falling: Secondary | ICD-10-CM | POA: Diagnosis not present

## 2015-05-14 DIAGNOSIS — M545 Low back pain: Secondary | ICD-10-CM | POA: Diagnosis not present

## 2015-05-14 DIAGNOSIS — T84041D Periprosthetic fracture around internal prosthetic left hip joint, subsequent encounter: Secondary | ICD-10-CM | POA: Diagnosis not present

## 2015-05-17 DIAGNOSIS — I10 Essential (primary) hypertension: Secondary | ICD-10-CM | POA: Diagnosis not present

## 2015-05-17 DIAGNOSIS — M545 Low back pain: Secondary | ICD-10-CM | POA: Diagnosis not present

## 2015-05-17 DIAGNOSIS — T84041D Periprosthetic fracture around internal prosthetic left hip joint, subsequent encounter: Secondary | ICD-10-CM | POA: Diagnosis not present

## 2015-05-17 DIAGNOSIS — Z9181 History of falling: Secondary | ICD-10-CM | POA: Diagnosis not present

## 2015-05-17 DIAGNOSIS — R2689 Other abnormalities of gait and mobility: Secondary | ICD-10-CM | POA: Diagnosis not present

## 2015-05-19 DIAGNOSIS — R2689 Other abnormalities of gait and mobility: Secondary | ICD-10-CM | POA: Diagnosis not present

## 2015-05-19 DIAGNOSIS — Z9181 History of falling: Secondary | ICD-10-CM | POA: Diagnosis not present

## 2015-05-19 DIAGNOSIS — M545 Low back pain: Secondary | ICD-10-CM | POA: Diagnosis not present

## 2015-05-19 DIAGNOSIS — I10 Essential (primary) hypertension: Secondary | ICD-10-CM | POA: Diagnosis not present

## 2015-05-19 DIAGNOSIS — T84041D Periprosthetic fracture around internal prosthetic left hip joint, subsequent encounter: Secondary | ICD-10-CM | POA: Diagnosis not present

## 2015-05-28 DIAGNOSIS — M25552 Pain in left hip: Secondary | ICD-10-CM | POA: Diagnosis not present

## 2015-05-31 DIAGNOSIS — Z87440 Personal history of urinary (tract) infections: Secondary | ICD-10-CM | POA: Diagnosis not present

## 2015-05-31 DIAGNOSIS — S7292XD Unspecified fracture of left femur, subsequent encounter for closed fracture with routine healing: Secondary | ICD-10-CM | POA: Diagnosis not present

## 2015-05-31 DIAGNOSIS — I10 Essential (primary) hypertension: Secondary | ICD-10-CM | POA: Diagnosis not present

## 2015-05-31 DIAGNOSIS — Z7982 Long term (current) use of aspirin: Secondary | ICD-10-CM | POA: Diagnosis not present

## 2015-06-02 DIAGNOSIS — I10 Essential (primary) hypertension: Secondary | ICD-10-CM | POA: Diagnosis not present

## 2015-06-02 DIAGNOSIS — S7292XD Unspecified fracture of left femur, subsequent encounter for closed fracture with routine healing: Secondary | ICD-10-CM | POA: Diagnosis not present

## 2015-06-02 DIAGNOSIS — Z87440 Personal history of urinary (tract) infections: Secondary | ICD-10-CM | POA: Diagnosis not present

## 2015-06-02 DIAGNOSIS — Z7982 Long term (current) use of aspirin: Secondary | ICD-10-CM | POA: Diagnosis not present

## 2015-06-07 DIAGNOSIS — I10 Essential (primary) hypertension: Secondary | ICD-10-CM | POA: Diagnosis not present

## 2015-06-07 DIAGNOSIS — Z87440 Personal history of urinary (tract) infections: Secondary | ICD-10-CM | POA: Diagnosis not present

## 2015-06-07 DIAGNOSIS — Z7982 Long term (current) use of aspirin: Secondary | ICD-10-CM | POA: Diagnosis not present

## 2015-06-07 DIAGNOSIS — S7292XD Unspecified fracture of left femur, subsequent encounter for closed fracture with routine healing: Secondary | ICD-10-CM | POA: Diagnosis not present

## 2015-06-09 DIAGNOSIS — I10 Essential (primary) hypertension: Secondary | ICD-10-CM | POA: Diagnosis not present

## 2015-06-09 DIAGNOSIS — Z7982 Long term (current) use of aspirin: Secondary | ICD-10-CM | POA: Diagnosis not present

## 2015-06-09 DIAGNOSIS — E039 Hypothyroidism, unspecified: Secondary | ICD-10-CM | POA: Diagnosis not present

## 2015-06-09 DIAGNOSIS — R269 Unspecified abnormalities of gait and mobility: Secondary | ICD-10-CM | POA: Diagnosis not present

## 2015-06-09 DIAGNOSIS — S7292XD Unspecified fracture of left femur, subsequent encounter for closed fracture with routine healing: Secondary | ICD-10-CM | POA: Diagnosis not present

## 2015-06-09 DIAGNOSIS — R600 Localized edema: Secondary | ICD-10-CM | POA: Diagnosis not present

## 2015-06-09 DIAGNOSIS — M4806 Spinal stenosis, lumbar region: Secondary | ICD-10-CM | POA: Diagnosis not present

## 2015-06-09 DIAGNOSIS — D509 Iron deficiency anemia, unspecified: Secondary | ICD-10-CM | POA: Diagnosis not present

## 2015-06-09 DIAGNOSIS — Z87440 Personal history of urinary (tract) infections: Secondary | ICD-10-CM | POA: Diagnosis not present

## 2015-06-15 DIAGNOSIS — S7292XD Unspecified fracture of left femur, subsequent encounter for closed fracture with routine healing: Secondary | ICD-10-CM | POA: Diagnosis not present

## 2015-06-15 DIAGNOSIS — Z87440 Personal history of urinary (tract) infections: Secondary | ICD-10-CM | POA: Diagnosis not present

## 2015-06-15 DIAGNOSIS — Z23 Encounter for immunization: Secondary | ICD-10-CM | POA: Diagnosis not present

## 2015-06-15 DIAGNOSIS — Z7982 Long term (current) use of aspirin: Secondary | ICD-10-CM | POA: Diagnosis not present

## 2015-06-15 DIAGNOSIS — I10 Essential (primary) hypertension: Secondary | ICD-10-CM | POA: Diagnosis not present

## 2015-06-16 DIAGNOSIS — Z79899 Other long term (current) drug therapy: Secondary | ICD-10-CM | POA: Diagnosis not present

## 2015-06-17 DIAGNOSIS — Z87440 Personal history of urinary (tract) infections: Secondary | ICD-10-CM | POA: Diagnosis not present

## 2015-06-17 DIAGNOSIS — I10 Essential (primary) hypertension: Secondary | ICD-10-CM | POA: Diagnosis not present

## 2015-06-17 DIAGNOSIS — Z7982 Long term (current) use of aspirin: Secondary | ICD-10-CM | POA: Diagnosis not present

## 2015-06-17 DIAGNOSIS — S7292XD Unspecified fracture of left femur, subsequent encounter for closed fracture with routine healing: Secondary | ICD-10-CM | POA: Diagnosis not present

## 2015-06-21 DIAGNOSIS — S7292XD Unspecified fracture of left femur, subsequent encounter for closed fracture with routine healing: Secondary | ICD-10-CM | POA: Diagnosis not present

## 2015-06-21 DIAGNOSIS — I10 Essential (primary) hypertension: Secondary | ICD-10-CM | POA: Diagnosis not present

## 2015-06-21 DIAGNOSIS — Z87440 Personal history of urinary (tract) infections: Secondary | ICD-10-CM | POA: Diagnosis not present

## 2015-06-21 DIAGNOSIS — Z7982 Long term (current) use of aspirin: Secondary | ICD-10-CM | POA: Diagnosis not present

## 2015-06-24 DIAGNOSIS — S7292XD Unspecified fracture of left femur, subsequent encounter for closed fracture with routine healing: Secondary | ICD-10-CM | POA: Diagnosis not present

## 2015-06-24 DIAGNOSIS — Z7982 Long term (current) use of aspirin: Secondary | ICD-10-CM | POA: Diagnosis not present

## 2015-06-24 DIAGNOSIS — Z87440 Personal history of urinary (tract) infections: Secondary | ICD-10-CM | POA: Diagnosis not present

## 2015-06-24 DIAGNOSIS — I10 Essential (primary) hypertension: Secondary | ICD-10-CM | POA: Diagnosis not present

## 2015-06-28 DIAGNOSIS — S7292XD Unspecified fracture of left femur, subsequent encounter for closed fracture with routine healing: Secondary | ICD-10-CM | POA: Diagnosis not present

## 2015-06-28 DIAGNOSIS — Z87440 Personal history of urinary (tract) infections: Secondary | ICD-10-CM | POA: Diagnosis not present

## 2015-06-28 DIAGNOSIS — I10 Essential (primary) hypertension: Secondary | ICD-10-CM | POA: Diagnosis not present

## 2015-06-28 DIAGNOSIS — Z7982 Long term (current) use of aspirin: Secondary | ICD-10-CM | POA: Diagnosis not present

## 2015-07-01 DIAGNOSIS — I10 Essential (primary) hypertension: Secondary | ICD-10-CM | POA: Diagnosis not present

## 2015-07-01 DIAGNOSIS — Z87440 Personal history of urinary (tract) infections: Secondary | ICD-10-CM | POA: Diagnosis not present

## 2015-07-01 DIAGNOSIS — S7292XD Unspecified fracture of left femur, subsequent encounter for closed fracture with routine healing: Secondary | ICD-10-CM | POA: Diagnosis not present

## 2015-07-01 DIAGNOSIS — Z7982 Long term (current) use of aspirin: Secondary | ICD-10-CM | POA: Diagnosis not present

## 2015-07-05 DIAGNOSIS — Z7982 Long term (current) use of aspirin: Secondary | ICD-10-CM | POA: Diagnosis not present

## 2015-07-05 DIAGNOSIS — S7292XD Unspecified fracture of left femur, subsequent encounter for closed fracture with routine healing: Secondary | ICD-10-CM | POA: Diagnosis not present

## 2015-07-05 DIAGNOSIS — I10 Essential (primary) hypertension: Secondary | ICD-10-CM | POA: Diagnosis not present

## 2015-07-05 DIAGNOSIS — Z87440 Personal history of urinary (tract) infections: Secondary | ICD-10-CM | POA: Diagnosis not present

## 2015-07-07 DIAGNOSIS — S7292XD Unspecified fracture of left femur, subsequent encounter for closed fracture with routine healing: Secondary | ICD-10-CM | POA: Diagnosis not present

## 2015-07-07 DIAGNOSIS — I10 Essential (primary) hypertension: Secondary | ICD-10-CM | POA: Diagnosis not present

## 2015-07-07 DIAGNOSIS — Z7982 Long term (current) use of aspirin: Secondary | ICD-10-CM | POA: Diagnosis not present

## 2015-07-07 DIAGNOSIS — Z87440 Personal history of urinary (tract) infections: Secondary | ICD-10-CM | POA: Diagnosis not present

## 2015-07-12 DIAGNOSIS — S7292XD Unspecified fracture of left femur, subsequent encounter for closed fracture with routine healing: Secondary | ICD-10-CM | POA: Diagnosis not present

## 2015-07-12 DIAGNOSIS — Z7982 Long term (current) use of aspirin: Secondary | ICD-10-CM | POA: Diagnosis not present

## 2015-07-12 DIAGNOSIS — Z87440 Personal history of urinary (tract) infections: Secondary | ICD-10-CM | POA: Diagnosis not present

## 2015-07-12 DIAGNOSIS — I10 Essential (primary) hypertension: Secondary | ICD-10-CM | POA: Diagnosis not present

## 2015-07-14 DIAGNOSIS — Z7982 Long term (current) use of aspirin: Secondary | ICD-10-CM | POA: Diagnosis not present

## 2015-07-14 DIAGNOSIS — I10 Essential (primary) hypertension: Secondary | ICD-10-CM | POA: Diagnosis not present

## 2015-07-14 DIAGNOSIS — S7292XD Unspecified fracture of left femur, subsequent encounter for closed fracture with routine healing: Secondary | ICD-10-CM | POA: Diagnosis not present

## 2015-07-14 DIAGNOSIS — Z87440 Personal history of urinary (tract) infections: Secondary | ICD-10-CM | POA: Diagnosis not present

## 2015-07-15 DIAGNOSIS — I10 Essential (primary) hypertension: Secondary | ICD-10-CM | POA: Diagnosis not present

## 2015-07-15 DIAGNOSIS — M169 Osteoarthritis of hip, unspecified: Secondary | ICD-10-CM | POA: Diagnosis not present

## 2015-07-15 DIAGNOSIS — R609 Edema, unspecified: Secondary | ICD-10-CM | POA: Diagnosis not present

## 2015-07-16 DIAGNOSIS — R609 Edema, unspecified: Secondary | ICD-10-CM | POA: Diagnosis not present

## 2015-07-16 DIAGNOSIS — R600 Localized edema: Secondary | ICD-10-CM | POA: Diagnosis not present

## 2015-07-19 DIAGNOSIS — Z7982 Long term (current) use of aspirin: Secondary | ICD-10-CM | POA: Diagnosis not present

## 2015-07-19 DIAGNOSIS — S7292XD Unspecified fracture of left femur, subsequent encounter for closed fracture with routine healing: Secondary | ICD-10-CM | POA: Diagnosis not present

## 2015-07-19 DIAGNOSIS — Z87440 Personal history of urinary (tract) infections: Secondary | ICD-10-CM | POA: Diagnosis not present

## 2015-07-19 DIAGNOSIS — I10 Essential (primary) hypertension: Secondary | ICD-10-CM | POA: Diagnosis not present

## 2015-07-21 DIAGNOSIS — Z7982 Long term (current) use of aspirin: Secondary | ICD-10-CM | POA: Diagnosis not present

## 2015-07-21 DIAGNOSIS — Z87440 Personal history of urinary (tract) infections: Secondary | ICD-10-CM | POA: Diagnosis not present

## 2015-07-21 DIAGNOSIS — I10 Essential (primary) hypertension: Secondary | ICD-10-CM | POA: Diagnosis not present

## 2015-07-21 DIAGNOSIS — S7292XD Unspecified fracture of left femur, subsequent encounter for closed fracture with routine healing: Secondary | ICD-10-CM | POA: Diagnosis not present

## 2015-08-02 DIAGNOSIS — N390 Urinary tract infection, site not specified: Secondary | ICD-10-CM | POA: Diagnosis not present

## 2015-08-02 DIAGNOSIS — I8391 Asymptomatic varicose veins of right lower extremity: Secondary | ICD-10-CM | POA: Diagnosis not present

## 2015-08-02 DIAGNOSIS — B351 Tinea unguium: Secondary | ICD-10-CM | POA: Diagnosis not present

## 2015-08-02 DIAGNOSIS — I10 Essential (primary) hypertension: Secondary | ICD-10-CM | POA: Diagnosis not present

## 2015-10-11 DIAGNOSIS — M545 Low back pain: Secondary | ICD-10-CM | POA: Diagnosis not present

## 2015-10-15 DIAGNOSIS — Z7952 Long term (current) use of systemic steroids: Secondary | ICD-10-CM | POA: Diagnosis not present

## 2015-10-15 DIAGNOSIS — M545 Low back pain: Secondary | ICD-10-CM | POA: Diagnosis not present

## 2015-10-15 DIAGNOSIS — Z8781 Personal history of (healed) traumatic fracture: Secondary | ICD-10-CM | POA: Diagnosis not present

## 2015-10-15 DIAGNOSIS — Z7982 Long term (current) use of aspirin: Secondary | ICD-10-CM | POA: Diagnosis not present

## 2015-10-15 DIAGNOSIS — I10 Essential (primary) hypertension: Secondary | ICD-10-CM | POA: Diagnosis not present

## 2015-10-18 ENCOUNTER — Encounter (HOSPITAL_COMMUNITY): Payer: Self-pay | Admitting: Emergency Medicine

## 2015-10-18 ENCOUNTER — Observation Stay (HOSPITAL_COMMUNITY)
Admission: EM | Admit: 2015-10-18 | Discharge: 2015-10-21 | Disposition: A | Discharge status: Home / Self Care | Payer: Medicare Other | Attending: Internal Medicine | Admitting: Internal Medicine

## 2015-10-18 ENCOUNTER — Emergency Department (HOSPITAL_COMMUNITY): Payer: Medicare Other

## 2015-10-18 DIAGNOSIS — D539 Nutritional anemia, unspecified: Secondary | ICD-10-CM | POA: Insufficient documentation

## 2015-10-18 DIAGNOSIS — I10 Essential (primary) hypertension: Secondary | ICD-10-CM | POA: Diagnosis not present

## 2015-10-18 DIAGNOSIS — F05 Delirium due to known physiological condition: Secondary | ICD-10-CM | POA: Diagnosis present

## 2015-10-18 DIAGNOSIS — N179 Acute kidney failure, unspecified: Secondary | ICD-10-CM | POA: Insufficient documentation

## 2015-10-18 DIAGNOSIS — N390 Urinary tract infection, site not specified: Secondary | ICD-10-CM | POA: Diagnosis not present

## 2015-10-18 DIAGNOSIS — S3289XA Fracture of other parts of pelvis, initial encounter for closed fracture: Secondary | ICD-10-CM | POA: Diagnosis not present

## 2015-10-18 DIAGNOSIS — S32010A Wedge compression fracture of first lumbar vertebra, initial encounter for closed fracture: Secondary | ICD-10-CM

## 2015-10-18 DIAGNOSIS — B9620 Unspecified Escherichia coli [E. coli] as the cause of diseases classified elsewhere: Secondary | ICD-10-CM | POA: Insufficient documentation

## 2015-10-18 DIAGNOSIS — Z79899 Other long term (current) drug therapy: Secondary | ICD-10-CM | POA: Insufficient documentation

## 2015-10-18 DIAGNOSIS — M4800 Spinal stenosis, site unspecified: Secondary | ICD-10-CM | POA: Diagnosis present

## 2015-10-18 DIAGNOSIS — Z66 Do not resuscitate: Secondary | ICD-10-CM | POA: Diagnosis not present

## 2015-10-18 DIAGNOSIS — X58XXXA Exposure to other specified factors, initial encounter: Secondary | ICD-10-CM | POA: Insufficient documentation

## 2015-10-18 DIAGNOSIS — M8448XA Pathological fracture, other site, initial encounter for fracture: Secondary | ICD-10-CM | POA: Diagnosis not present

## 2015-10-18 DIAGNOSIS — S32502A Unspecified fracture of left pubis, initial encounter for closed fracture: Secondary | ICD-10-CM | POA: Diagnosis not present

## 2015-10-18 DIAGNOSIS — Z7982 Long term (current) use of aspirin: Secondary | ICD-10-CM | POA: Diagnosis not present

## 2015-10-18 DIAGNOSIS — S32019A Unspecified fracture of first lumbar vertebra, initial encounter for closed fracture: Secondary | ICD-10-CM

## 2015-10-18 DIAGNOSIS — F0390 Unspecified dementia without behavioral disturbance: Secondary | ICD-10-CM | POA: Diagnosis not present

## 2015-10-18 DIAGNOSIS — Z8781 Personal history of (healed) traumatic fracture: Secondary | ICD-10-CM | POA: Diagnosis not present

## 2015-10-18 DIAGNOSIS — R262 Difficulty in walking, not elsewhere classified: Secondary | ICD-10-CM | POA: Diagnosis present

## 2015-10-18 DIAGNOSIS — M545 Low back pain: Secondary | ICD-10-CM | POA: Diagnosis not present

## 2015-10-18 DIAGNOSIS — M4856XA Collapsed vertebra, not elsewhere classified, lumbar region, initial encounter for fracture: Secondary | ICD-10-CM | POA: Diagnosis not present

## 2015-10-18 DIAGNOSIS — Z96642 Presence of left artificial hip joint: Secondary | ICD-10-CM | POA: Diagnosis not present

## 2015-10-18 DIAGNOSIS — S329XXA Fracture of unspecified parts of lumbosacral spine and pelvis, initial encounter for closed fracture: Secondary | ICD-10-CM

## 2015-10-18 DIAGNOSIS — M25559 Pain in unspecified hip: Secondary | ICD-10-CM | POA: Insufficient documentation

## 2015-10-18 DIAGNOSIS — M549 Dorsalgia, unspecified: Secondary | ICD-10-CM

## 2015-10-18 DIAGNOSIS — K5900 Constipation, unspecified: Secondary | ICD-10-CM | POA: Insufficient documentation

## 2015-10-18 DIAGNOSIS — S32000A Wedge compression fracture of unspecified lumbar vertebra, initial encounter for closed fracture: Secondary | ICD-10-CM | POA: Diagnosis present

## 2015-10-18 DIAGNOSIS — Z7952 Long term (current) use of systemic steroids: Secondary | ICD-10-CM | POA: Diagnosis not present

## 2015-10-18 HISTORY — DX: Essential (primary) hypertension: I10

## 2015-10-18 LAB — BASIC METABOLIC PANEL
Anion gap: 6 (ref 5–15)
BUN: 37 mg/dL — AB (ref 6–20)
CALCIUM: 8.4 mg/dL — AB (ref 8.9–10.3)
CO2: 24 mmol/L (ref 22–32)
Chloride: 113 mmol/L — ABNORMAL HIGH (ref 101–111)
Creatinine, Ser: 0.69 mg/dL (ref 0.44–1.00)
GFR calc Af Amer: >60 mL/min (ref >60)
GLUCOSE: 108 mg/dL — AB (ref 65–99)
POTASSIUM: 3.9 mmol/L (ref 3.5–5.1)
Sodium: 143 mmol/L (ref 135–145)

## 2015-10-18 LAB — CBC WITH DIFFERENTIAL/PLATELET
BASOS ABS: 0 10*3/uL (ref 0.0–0.1)
Basophils Relative: 0 %
EOS PCT: 1 %
Eosinophils Absolute: 0.1 10*3/uL (ref 0.0–0.7)
HEMATOCRIT: 28 % — AB (ref 36.0–46.0)
Hemoglobin: 9.2 g/dL — ABNORMAL LOW (ref 12.0–15.0)
LYMPHS PCT: 8 %
Lymphs Abs: 0.6 10*3/uL — ABNORMAL LOW (ref 0.7–4.0)
MCH: 32.6 pg (ref 26.0–34.0)
MCHC: 32.9 g/dL (ref 30.0–36.0)
MCV: 99.3 fL (ref 78.0–100.0)
MONO ABS: 0.5 10*3/uL (ref 0.1–1.0)
MONOS PCT: 7 %
Neutro Abs: 5.8 10*3/uL (ref 1.7–7.7)
Neutrophils Relative %: 84 %
PLATELETS: 181 10*3/uL (ref 150–400)
RBC: 2.82 MIL/uL — ABNORMAL LOW (ref 3.87–5.11)
RDW: 13.9 % (ref 11.5–15.5)
WBC: 6.9 10*3/uL (ref 4.0–10.5)

## 2015-10-18 LAB — URINALYSIS, ROUTINE W REFLEX MICROSCOPIC
BILIRUBIN URINE: NEGATIVE
GLUCOSE, UA: NEGATIVE mg/dL
HGB URINE DIPSTICK: NEGATIVE
KETONES UR: NEGATIVE mg/dL
Nitrite: POSITIVE — AB
PROTEIN: 100 mg/dL — AB
Specific Gravity, Urine: 1.028 (ref 1.005–1.030)
pH: 6 (ref 5.0–8.0)

## 2015-10-18 LAB — I-STAT CHEM 8, ED
BUN: 34 mg/dL — ABNORMAL HIGH (ref 6–20)
CALCIUM ION: 1.22 mmol/L (ref 1.13–1.30)
CREATININE: 0.7 mg/dL (ref 0.44–1.00)
Chloride: 109 mmol/L (ref 101–111)
GLUCOSE: 102 mg/dL — AB (ref 65–99)
HCT: 29 % — ABNORMAL LOW (ref 36.0–46.0)
HEMOGLOBIN: 9.9 g/dL — AB (ref 12.0–15.0)
POTASSIUM: 3.8 mmol/L (ref 3.5–5.1)
Sodium: 144 mmol/L (ref 135–145)
TCO2: 24 mmol/L (ref 0–100)

## 2015-10-18 LAB — URINE MICROSCOPIC-ADD ON

## 2015-10-18 MED ORDER — METOPROLOL SUCCINATE ER 25 MG PO TB24
25.0000 mg | ORAL_TABLET | Freq: Every day | ORAL | Status: DC
  Administered 2015-10-19 – 2015-10-21 (×3): 25 mg via ORAL
  Filled 2015-10-18 (×3): qty 1

## 2015-10-18 MED ORDER — ACETAMINOPHEN 325 MG PO TABS
650.0000 mg | ORAL_TABLET | Freq: Four times a day (QID) | ORAL | Status: DC | PRN
  Administered 2015-10-18 – 2015-10-21 (×3): 650 mg via ORAL
  Filled 2015-10-18 (×3): qty 2

## 2015-10-18 MED ORDER — ENOXAPARIN SODIUM 30 MG/0.3ML ~~LOC~~ SOLN
30.0000 mg | SUBCUTANEOUS | Status: DC
  Administered 2015-10-18 – 2015-10-19 (×2): 30 mg via SUBCUTANEOUS
  Filled 2015-10-18 (×3): qty 0.3

## 2015-10-18 MED ORDER — ONDANSETRON HCL 4 MG/2ML IJ SOLN
4.0000 mg | Freq: Once | INTRAMUSCULAR | Status: AC
  Administered 2015-10-18: 4 mg via INTRAVENOUS
  Filled 2015-10-18: qty 2

## 2015-10-18 MED ORDER — CYCLOBENZAPRINE HCL 5 MG PO TABS
5.0000 mg | ORAL_TABLET | Freq: Three times a day (TID) | ORAL | Status: DC | PRN
  Administered 2015-10-20: 5 mg via ORAL
  Filled 2015-10-18: qty 1
  Filled 2015-10-18: qty 2

## 2015-10-18 MED ORDER — SODIUM CHLORIDE 0.9 % IV SOLN
INTRAVENOUS | Status: DC
  Administered 2015-10-18 – 2015-10-21 (×4): via INTRAVENOUS

## 2015-10-18 MED ORDER — FENTANYL CITRATE (PF) 100 MCG/2ML IJ SOLN
25.0000 ug | INTRAMUSCULAR | Status: DC | PRN
  Administered 2015-10-18 (×2): 25 ug via INTRAVENOUS
  Filled 2015-10-18 (×2): qty 2

## 2015-10-18 MED ORDER — OXYCODONE HCL 5 MG PO TABS
5.0000 mg | ORAL_TABLET | ORAL | Status: DC | PRN
  Administered 2015-10-18 – 2015-10-21 (×6): 5 mg via ORAL
  Filled 2015-10-18 (×6): qty 1

## 2015-10-18 MED ORDER — ACETAMINOPHEN 650 MG RE SUPP: 650.0000 mg | Freq: Four times a day (QID) | RECTAL | Status: DC | PRN

## 2015-10-18 MED ORDER — ASPIRIN 81 MG PO CHEW
81.0000 mg | CHEWABLE_TABLET | Freq: Every day | ORAL | Status: DC
  Administered 2015-10-19 – 2015-10-21 (×4): 81 mg via ORAL
  Filled 2015-10-18 (×4): qty 1

## 2015-10-18 MED ORDER — FENTANYL CITRATE (PF) 100 MCG/2ML IJ SOLN: 25.0000 ug | Freq: Once | INTRAMUSCULAR | Status: DC

## 2015-10-18 MED ORDER — DEXTROSE 5 % IV SOLN
1.0000 g | Freq: Once | INTRAVENOUS | Status: AC
  Administered 2015-10-18: 1 g via INTRAVENOUS
  Filled 2015-10-18: qty 10

## 2015-10-18 MED ORDER — DEXTROSE 5 % IV SOLN
1.0000 g | INTRAVENOUS | Status: DC
  Administered 2015-10-19 – 2015-10-20 (×2): 1 g via INTRAVENOUS
  Filled 2015-10-18 (×3): qty 10

## 2015-10-18 MED ORDER — ONDANSETRON HCL 4 MG/2ML IJ SOLN: 4.0000 mg | Freq: Four times a day (QID) | INTRAMUSCULAR | Status: DC | PRN

## 2015-10-18 MED ORDER — CEFTRIAXONE SODIUM 1 G IJ SOLR: 1.0000 g | Freq: Once | INTRAMUSCULAR | Status: DC

## 2015-10-18 MED ORDER — HYDROMORPHONE HCL 1 MG/ML IJ SOLN
0.5000 mg | INTRAMUSCULAR | Status: DC | PRN
  Administered 2015-10-19: 1 mg via INTRAVENOUS
  Administered 2015-10-20 (×2): 0.5 mg via INTRAVENOUS
  Administered 2015-10-20: 1 mg via INTRAVENOUS
  Administered 2015-10-20 – 2015-10-21 (×3): 0.5 mg via INTRAVENOUS
  Filled 2015-10-18 (×6): qty 1

## 2015-10-18 MED ORDER — SODIUM CHLORIDE 0.9 % IV BOLUS (SEPSIS)
1000.0000 mL | Freq: Once | INTRAVENOUS | Status: AC
  Administered 2015-10-18: 1000 mL via INTRAVENOUS

## 2015-10-18 MED ORDER — ONDANSETRON HCL 4 MG PO TABS: 4.0000 mg | ORAL_TABLET | Freq: Four times a day (QID) | ORAL | Status: DC | PRN

## 2015-10-18 MED ORDER — ALUM & MAG HYDROXIDE-SIMETH 200-200-20 MG/5ML PO SUSP: 30.0000 mL | Freq: Four times a day (QID) | ORAL | Status: DC | PRN

## 2015-10-18 MED ORDER — CALCIUM CARBONATE 1250 (500 CA) MG PO TABS
1500.0000 mg | ORAL_TABLET | Freq: Every day | ORAL | Status: DC
  Administered 2015-10-19 – 2015-10-20 (×3): 1250 mg via ORAL
  Filled 2015-10-18 (×4): qty 1

## 2015-10-18 NOTE — Clinical Social Work Note (Signed)
Clinical Social Work Assessment  Patient Details  Name: Katherine Kirby MRN: [id number] Date of Birth: June 19, 1922  Date of referral:  10/18/15               Reason for consult:   (Patient is from [place] sought to share information with:   (None.) Permission granted to share information::  No  Name::        Agency::     Relationship::     Contact Information:     Housing/Transportation Living arrangements for the past 2 months:   [facility].) Source of Information:  Patient, Adult Children (Daughter in [place] was present. ) Patient Interpreter Needed:  None Criminal Activity/Legal Involvement Pertinent to Current Situation/Hospitalization:  No - Comment as needed Significant Relationships:  Adult Children (Daughter in law states patient's son is POA. Patient has a good support from son and his family.) Lives with:  Facility Resident Do you feel safe going back to the place where you live?  No (Family is interested in SNF. Patient is from [place].) Need for family participation in patient care:  Yes (Comment)  Care giving concerns:  Patient and daughter in law are interested in SNF placement for PT.   Social Worker assessment / plan:  CSW met with patient at bedside. Daughter in law was present. Patient confirms that she presents to [facility] due to groin pain. Also, patient confirms that she is form [place]. Daughter states patient moved from [place] to [employer], and is in the independent living unit.  Patient denies falling often. Daughter in law states patient has maybe fallen x1 within 6 months.   Employment status:  Retired Forensic scientist:   (None Listed at this time.) PT Recommendations:  Not assessed at this time Information / Referral to community resources:   (CSW educated family about SNF.)  Patient/Family's Response to care:  Patient and family are aware of admission are accepting at this  time.  Patient/Family's Understanding of and Emotional Response to Diagnosis, Current Treatment, and Prognosis:  No questions for CSW at this time.  Emotional Assessment Appearance:  Appears stated age Attitude/Demeanor/Rapport:   (Appropriate.) Affect (typically observed):  Appropriate, Accepting Orientation:  Oriented to Self, Oriented to Place, Oriented to  Time, Oriented to Situation Alcohol / Substance use:  Not Applicable Psych involvement (Current and /or in the community):  No (Comment)  Discharge Needs  Concerns to be addressed:  Adjustment to Illness Readmission within the last 30 days:    Current discharge risk:  None Barriers to Discharge:      Katherine Buffy, LCSW 10/18/2015, 5:32 PM

## 2015-10-18 NOTE — ED Provider Notes (Signed)
CSN: [id number]     Arrival date & time 10/18/15  1226 History   First MD Initiated Contact with Patient 10/18/15 1240     Chief Complaint  Patient presents with  . Back Pain     (Consider location/radiation/quality/duration/timing/severity/associated sxs/prior Treatment) HPI   Katherine Kirby Is a 82 year old female brought in by her family for complaint of low back pain and inability to walk. She is a past history of previous left total hip arthroplasty, and spinal stenosis. Last Sunday, she began complaining of severe lower back pain. She is normally ambulatory at an assisted living facility with her walker. Since last Sunday. She has been unable to ambulate. Her daughter has been staying with her 24 hours a day in order to assist her using the bathroom. She states that this all Dr. Renaye Rakers as an outpatient, did a lumbar x-ray and were given a steroid Dosepak and muscle relaxers which have not helped. Her pain has been increasing. It is worse with movement. She her daughter states that she has not had any overflow incontinence or bowel incontinence. No fevers or chills, no past history of cancer. She has no weakness in her legs or saddle anesthesia. There is a level V caveat due to dementia. History is predominantly provided by her family.  Past Medical History  Diagnosis Date  . Hypertension    History reviewed. No pertinent past surgical history. History reviewed. No pertinent family history. Social History  Substance Use Topics  . Smoking status: Never Smoker   . Smokeless tobacco: None  . Alcohol Use: Yes     Comment: ocasional   OB History    No data available     Review of Systems  Unable to review systems due to dementia  Allergies  Review of patient's allergies indicates no known allergies.  Home Medications   Prior to Admission medications   Medication Sig Start Date End Date Taking? Authorizing Provider  aspirin 81 MG chewable tablet Chew 81 mg by mouth  daily.   Yes Historical Provider, MD  calcium carbonate (OSCAL) 1500 (600 Ca) MG TABS tablet Take 1,500 mg by mouth daily.   Yes Historical Provider, MD  Cyanocobalamin (VITAMIN B 12 PO) Take 1 tablet by mouth daily.   Yes Historical Provider, MD  cyclobenzaprine (FLEXERIL) 10 MG tablet Take 5-10 mg by mouth 3 (three) times daily as needed for muscle spasms.   Yes Historical Provider, MD  metoprolol succinate (TOPROL-XL) 25 MG 24 hr tablet Take 25 mg by mouth daily.   Yes Historical Provider, MD  predniSONE (DELTASONE) 5 MG tablet Take 5 mg by mouth taper from 4 doses each day to 1 dose and stop.   Yes Historical Provider, MD   BP 163/87 mmHg  Pulse 76  Temp(Src) 97.7 F (36.5 C) (Oral)  Resp 18  SpO2 96% Physical Exam  Constitutional: She is oriented to person, place, and time. She appears well-developed and well-nourished. No distress.  HENT:  Head: Normocephalic and atraumatic.  Eyes: Conjunctivae are normal. No scleral icterus.  Neck: Normal range of motion.  Cardiovascular: Normal rate, regular rhythm and normal heart sounds.  Exam reveals no gallop and no friction rub.   No murmur heard. Pulmonary/Chest: Effort normal and breath sounds normal. No respiratory distress.  Abdominal: Soft. Bowel sounds are normal. She exhibits no distension and no mass. There is no tenderness. There is no guarding.  Musculoskeletal:  No midline tenderness, no paraspinal tenderness Patient complains of groin pain with flexion  of the left hip, she has low back pain with flexion of the right hip, normal DTRs, sensation and pulses intact.  Neurological: She is alert and oriented to person, place, and time.  Skin: Skin is warm and dry. She is not diaphoretic.  Nursing note and vitals reviewed.   ED Course  Procedures (including critical care time) Labs Review Labs Reviewed  URINALYSIS, ROUTINE W REFLEX MICROSCOPIC (NOT AT [facility]) - Abnormal; Notable for the following:    APPearance CLOUDY (*)     Protein, ur 100 (*)    Nitrite POSITIVE (*)    Leukocytes, UA MODERATE (*)    All other components within normal limits  URINE MICROSCOPIC-ADD ON - Abnormal; Notable for the following:    Squamous Epithelial / LPF 6-30 (*)    Bacteria, UA MANY (*)    Casts HYALINE CASTS (*)    All other components within normal limits  URINE CULTURE  BASIC METABOLIC PANEL  CBC WITH DIFFERENTIAL/PLATELET  I-STAT CREATININE, ED  I-STAT CHEM 8, ED    Imaging Review Dg Lumbar Spine Complete  10/18/2015  CLINICAL DATA:  Bilateral groin and low back pain for 1 week. No known injury. Initial encounter. EXAM: LUMBAR SPINE - COMPLETE 4+ VIEW COMPARISON:  None. FINDINGS: The patient has a mild superior endplate compression fracture of L1 with approximately 25-30% vertebral body height loss. Although the fracture cannot be definitively characterized, fracture margins appear somewhat sharp suggesting acute or subacute injury. Vertebral body height is otherwise normal. There is trace anterolisthesis L5 on S1 due to facet arthropathy. Scattered mild loss of disc space height is noted. IMPRESSION: Mild L1 superior endplate compression fracture not be definitively characterized but appears acute or subacute. Electronically Signed   By: Drusilla Kanner M.D.   On: 10/18/2015 14:44   Dg Hips Bilat With Pelvis Min 5 Views  10/18/2015  CLINICAL DATA:  Bilateral groin pain, low back pain EXAM: DG HIP (WITH OR WITHOUT PELVIS) 5+V BILAT COMPARISON:  None. FINDINGS: There is no evidence of hip fracture or dislocation. There is a mildly displaced left parasymphyseal fracture. There is a total left hip arthroplasty. IMPRESSION: Mildly displaced left parasymphyseal fracture. Electronically Signed   By: Elige Ko   On: 10/18/2015 14:44   I have personally reviewed and evaluated these images and lab results as part of my medical decision-making.   EKG Interpretation None      MDM   Final diagnoses:  Compression fracture of L1  lumbar vertebra, closed, initial encounter (HCC)  Pelvic fracture, closed, initial encounter  Unable to ambulate  UTI (lower urinary tract infection)    Patient with L1 compression fracture and a  Consent to see oral fracture of the left pelvis. She is unable to ambulate. She is an otherwise healthy 82 year old female with mild dementia. We will admit the patient for fracture and inability to ambulate.   4:25 PM BP 163/87 mmHg  Pulse 76  Temp(Src) 97.7 F (36.5 C) (Oral)  Resp 18  SpO2 96% Patient with uti. I have given the patient in sign out to PA sanders.  Arthor Captain, PA-C 10/18/15 1626  Laurence Spates, MD 10/18/15 1726

## 2015-10-18 NOTE — ED Notes (Signed)
Bed: ZO10 Expected date:  Expected time:  Means of arrival:  Comments: EMS- 82yo F, bilateral groin pain

## 2015-10-18 NOTE — ED Notes (Signed)
V/S incorrect by machine.  Patient stable.  No signs of distress

## 2015-10-18 NOTE — ED Notes (Signed)
Per EMS, Pt from [facility], c/o increased bilateral groin pain and L lower back pain.  A&Ox4. Not ambulatory.

## 2015-10-18 NOTE — H&P (Signed)
[facility] Admission History and Physical       Katherine Kirby ZOX:[id number] DOB: 10-09-1921 DOA: 10/18/2015  Referring physician: EDP PCP: No primary care provider on file.  Specialists:   Chief Complaint:  Pain in Low Back and Both Hips and Difficulty Walking  HPI: Katherine Kirby is a 82 y.o. female with a history of HTN, Spinal Stenosis who was brought to the ED by her family due to increased low back and bilateral hip pain and difficulty walking for the past week.   She denies any fall or trauma.   She had been taken to the Orthopedics Dr Eulah Pont and had x-rays performed and was prescribed a prednisone taper.   In the ED, she was evaluated and found to have a L-1 compression fracture, and a mildy displaced Left Parasymphyseal Fracture on X-rays.    She was referred for admission.   She was also found to have a UTI and was placed on IV Rocephin.       Review of Systems:  Constitutional: No Weight Loss, No Weight Gain, Night Sweats, Fevers, Chills, Dizziness, Light Headedness, Fatigue, or Generalized Weakness HEENT: No Headaches, Difficulty Swallowing,Tooth/Dental Problems,Sore Throat,  No Sneezing, Rhinitis, Ear Ache, Nasal Congestion, or Post Nasal Drip,  Cardio-vascular:  No Chest pain, Orthopnea, PND, Edema in Lower Extremities, Anasarca, Dizziness, Palpitations  Resp: No Dyspnea, No DOE, No Productive Cough, No Non-Productive Cough, No Hemoptysis, No Wheezing.    GI: No Heartburn, Indigestion, Abdominal Pain, Nausea, Vomiting, Diarrhea, Constipation, Hematemesis, Hematochezia, Melena, Change in Bowel Habits,  Loss of Appetite  GU: No Dysuria, No Change in Color of Urine, No Urgency or Urinary Frequency, No Flank pain.  Musculoskeletal: No Joint Pain or Swelling, No Decreased Range of Motion, +Back Pain.  Neurologic: No Syncope, No Seizures, Muscle Weakness, Paresthesia, Vision Disturbance or Loss, No Diplopia, No Vertigo, +Difficulty Walking,  Skin: No Rash or  Lesions. Psych: No Change in Mood or Affect, No Depression or Anxiety, No Memory loss, No Confusion, or Hallucinations   Past Medical History  Diagnosis Date  . Hypertension      History reviewed. No pertinent past surgical history.    Prior to Admission medications   Medication Sig Start Date End Date Taking? Authorizing Provider  aspirin 81 MG chewable tablet Chew 81 mg by mouth daily.   Yes Historical Provider, MD  calcium carbonate (OSCAL) 1500 (600 Ca) MG TABS tablet Take 1,500 mg by mouth daily.   Yes Historical Provider, MD  Cyanocobalamin (VITAMIN B 12 PO) Take 1 tablet by mouth daily.   Yes Historical Provider, MD  cyclobenzaprine (FLEXERIL) 10 MG tablet Take 5-10 mg by mouth 3 (three) times daily as needed for muscle spasms.   Yes Historical Provider, MD  metoprolol succinate (TOPROL-XL) 25 MG 24 hr tablet Take 25 mg by mouth daily.   Yes Historical Provider, MD  predniSONE (DELTASONE) 5 MG tablet Take 5 mg by mouth taper from 4 doses each day to 1 dose and stop.   Yes Historical Provider, MD     No Known Allergies  Social History:  reports that she has never smoked. She does not have any smokeless tobacco history on file. She reports that she drinks alcohol. She reports that she does not use illicit drugs.    History reviewed. No pertinent family history.     Physical Exam:  GEN:  Pleasant  Thin Elderly 82 y.o.  Caucasian female examined and in no acute distress; cooperative with exam Filed Vitals:  10/18/15 1236 10/18/15 1241 10/18/15 1546 10/18/15 1700  BP:  165/84 163/87 177/76  Pulse:  80 76 65  Temp:  97.7 F (36.5 C)    TempSrc:  Oral    Resp:  16 18 16   SpO2: 98% 97% 96% 94%   Blood pressure 177/76, pulse 65, temperature 97.7 F (36.5 C), temperature source Oral, resp. rate 16, SpO2 94 %. PSYCH: She is alert and oriented x4; does not appear anxious does not appear depressed; affect is normal HEENT: Normocephalic and Atraumatic, Mucous membranes pink;  PERRLA; EOM intact; Fundi:  Benign;  No scleral icterus, Nares: Patent, Oropharynx: Clear, Poor Dentition,    Neck:  FROM, No Cervical Lymphadenopathy nor Thyromegaly or Carotid Bruit; No JVD; Breasts:: Not examined CHEST WALL: No tenderness CHEST: Normal respiration, clear to auscultation bilaterally HEART: Regular rate and rhythm; no murmurs rubs or gallops BACK: No kyphosis or scoliosis; No CVA tenderness ABDOMEN: Positive Bowel Sounds, Scaphoid, Soft Non-Tender, No Rebound or Guarding; No Masses, No Organomegaly Rectal Exam: Not done EXTREMITIES: No   Cyanosis, Clubbing, or Edema; No Ulcerations. Genitalia: not examined PULSES: 2+ and symmetric SKIN: Normal hydration no rash or ulceration  CNS:  Alert and Oriented x 4, No Focal Deficits Vascular: pulses palpable throughout    Labs on Admission:  Basic Metabolic Panel:  Recent Labs Lab 10/18/15 1701 10/18/15 1728  NA 143 144  K 3.9 3.8  CL 113* 109  CO2 24  --   GLUCOSE 108* 102*  BUN 37* 34*  CREATININE 0.69 0.70  CALCIUM 8.4*  --    Liver Function Tests: No results for input(s): AST, ALT, ALKPHOS, BILITOT, PROT, ALBUMIN in the last 168 hours. No results for input(s): LIPASE, AMYLASE in the last 168 hours. No results for input(s): AMMONIA in the last 168 hours. CBC:  Recent Labs Lab 10/18/15 1701 10/18/15 1728  WBC 6.9  --   NEUTROABS 5.8  --   HGB 9.2* 9.9*  HCT 28.0* 29.0*  MCV 99.3  --   PLT 181  --    Cardiac Enzymes: No results for input(s): CKTOTAL, CKMB, CKMBINDEX, TROPONINI in the last 168 hours.  BNP (last 3 results) No results for input(s): BNP in the last 8760 hours.  ProBNP (last 3 results) No results for input(s): PROBNP in the last 8760 hours.  CBG: No results for input(s): GLUCAP in the last 168 hours.  Radiological Exams on Admission: Dg Lumbar Spine Complete  10/18/2015  CLINICAL DATA:  Bilateral groin and low back pain for 1 week. No known injury. Initial encounter. EXAM: LUMBAR  SPINE - COMPLETE 4+ VIEW COMPARISON:  None. FINDINGS: The patient has a mild superior endplate compression fracture of L1 with approximately 25-30% vertebral body height loss. Although the fracture cannot be definitively characterized, fracture margins appear somewhat sharp suggesting acute or subacute injury. Vertebral body height is otherwise normal. There is trace anterolisthesis L5 on S1 due to facet arthropathy. Scattered mild loss of disc space height is noted. IMPRESSION: Mild L1 superior endplate compression fracture not be definitively characterized but appears acute or subacute. Electronically Signed   By: Drusilla Kanner M.D.   On: 10/18/2015 14:44   Dg Hips Bilat With Pelvis Min 5 Views  10/18/2015  CLINICAL DATA:  Bilateral groin pain, low back pain EXAM: DG HIP (WITH OR WITHOUT PELVIS) 5+V BILAT COMPARISON:  None. FINDINGS: There is no evidence of hip fracture or dislocation. There is a mildly displaced left parasymphyseal fracture. There is a total left  hip arthroplasty. IMPRESSION: Mildly displaced left parasymphyseal fracture. Electronically Signed   By: Elige Ko   On: 10/18/2015 14:44     EKG: Independently reviewed.     Assessment/Plan:      82 y.o. female with  Principal Problem:    1.     Compression fracture of lumbar spine, non-traumatic/Lumbar compression fracture [facility])    Neurorsurgery consult per ED requested    IR Consult and Evaluation in AM    Pain Control    Physical therapy evaluation  Active Problems:     2.     Spinal stenosis    Decadron IV       3.     UTI (lower urinary tract infection)    Urine C+S sent    IV Rocephin      4.     AKI (acute kidney injury) (HCC)    Gentle IVFs    Monitor BUN/Cr      5.     Difficulty walking    Due to #1      6.     Hypertension    Continue Metoprolol Rx as tolerated    Monitor BPs      7.     DVT Prophylaxis    Lovenox    Code Status:     DO NOT RESUSCITATE (DNR)      Family Communication:    Daughter and Son In [place] at Bedside    Disposition Plan:    Inpatient Status        Time spent:  53 Minutes      Ron Parker [facility] Pager [phone]   If 7AM -7PM Please Contact the Day Rounding Team MD for [facility]  If 7PM-7AM, Please Contact Night-Floor Coverage  [email or web address] Password [email or web address] 10/18/2015, 6:51 PM     ADDENDUM:   Patient was seen and examined on 10/18/2015

## 2015-10-18 NOTE — ED Provider Notes (Signed)
Patient received in sign out from PA Harris at shift change.  See her note for full H&P.  Briefly, 82 y.o. F who has been unable to walk since last Sunday (8 days ago).  Patient is generally mabulatory with walker at her assisted living facility, [place].  Daughter has been caring for her during the day since this time.  Daughter took her to see orthopedics, Dr. Renaye Rakers, had x-rays done and was started on steroids.  Unable to view prior x-ray from OP clinic, x-rays repeated today which do show lumbar compression fx (acute vs old) and displaced left parasymphyseal fracture.  Patient still cant ambulate here in ED due to pain.  Patient's family feels she needs SNF due to decline and would like placement.  Patient was also found to have UTI today.  Will sent for culture, started on Rocephin.  Will admit to hospitalist service for ongoing care.   Dr. Lovell Sheehan will admit, requested neurosurgery consultation regarding compression fracture.  Have spoken with Dr. Bevely Palmer, no further intervention regarding compression fx.  Results for orders placed or performed during the hospital encounter of 10/18/15  CBC with Differential  Result Value Ref Range   WBC 6.9 4.0 - 10.5 K/uL   RBC 2.82 (L) 3.87 - 5.11 MIL/uL   Hemoglobin 9.2 (L) 12.0 - 15.0 g/dL   HCT 16.1 (L) 09.6 - 04.5 %   MCV 99.3 78.0 - 100.0 fL   MCH 32.6 26.0 - 34.0 pg   MCHC 32.9 30.0 - 36.0 g/dL   RDW 40.9 81.1 - 91.4 %   Platelets 181 150 - 400 K/uL   Neutrophils Relative % 84 %   Neutro Abs 5.8 1.7 - 7.7 K/uL   Lymphocytes Relative 8 %   Lymphs Abs 0.6 (L) 0.7 - 4.0 K/uL   Monocytes Relative 7 %   Monocytes Absolute 0.5 0.1 - 1.0 K/uL   Eosinophils Relative 1 %   Eosinophils Absolute 0.1 0.0 - 0.7 K/uL   Basophils Relative 0 %   Basophils Absolute 0.0 0.0 - 0.1 K/uL  Urinalysis, Routine w reflex microscopic  Result Value Ref Range   Color, Urine YELLOW YELLOW   APPearance CLOUDY (A) CLEAR   Specific Gravity, Urine 1.028 1.005  - 1.030   pH 6.0 5.0 - 8.0   Glucose, UA NEGATIVE NEGATIVE mg/dL   Hgb urine dipstick NEGATIVE NEGATIVE   Bilirubin Urine NEGATIVE NEGATIVE   Ketones, ur NEGATIVE NEGATIVE mg/dL   Protein, ur 782 (A) NEGATIVE mg/dL   Nitrite POSITIVE (A) NEGATIVE   Leukocytes, UA MODERATE (A) NEGATIVE  Urine microscopic-add on  Result Value Ref Range   Squamous Epithelial / LPF 6-30 (A) NONE SEEN   WBC, UA TOO NUMEROUS TO COUNT 0 - 5 WBC/hpf   RBC / HPF 0-5 0 - 5 RBC/hpf   Bacteria, UA MANY (A) NONE SEEN   Casts HYALINE CASTS (A) NEGATIVE  I-stat Chem 8, ED  Result Value Ref Range   Sodium 144 135 - 145 mmol/L   Potassium 3.8 3.5 - 5.1 mmol/L   Chloride 109 101 - 111 mmol/L   BUN 34 (H) 6 - 20 mg/dL   Creatinine, Ser 9.56 0.44 - 1.00 mg/dL   Glucose, Bld 213 (H) 65 - 99 mg/dL   Calcium, Ion 0.86 5.78 - 1.30 mmol/L   TCO2 24 0 - 100 mmol/L   Hemoglobin 9.9 (L) 12.0 - 15.0 g/dL   HCT 46.9 (L) 62.9 - 52.8 %   Dg Lumbar  Spine Complete  10/18/2015  CLINICAL DATA:  Bilateral groin and low back pain for 1 week. No known injury. Initial encounter. EXAM: LUMBAR SPINE - COMPLETE 4+ VIEW COMPARISON:  None. FINDINGS: The patient has a mild superior endplate compression fracture of L1 with approximately 25-30% vertebral body height loss. Although the fracture cannot be definitively characterized, fracture margins appear somewhat sharp suggesting acute or subacute injury. Vertebral body height is otherwise normal. There is trace anterolisthesis L5 on S1 due to facet arthropathy. Scattered mild loss of disc space height is noted. IMPRESSION: Mild L1 superior endplate compression fracture not be definitively characterized but appears acute or subacute. Electronically Signed   By: Drusilla Kanner M.D.   On: 10/18/2015 14:44   Dg Hips Bilat With Pelvis Min 5 Views  10/18/2015  CLINICAL DATA:  Bilateral groin pain, low back pain EXAM: DG HIP (WITH OR WITHOUT PELVIS) 5+V BILAT COMPARISON:  None. FINDINGS: There is no  evidence of hip fracture or dislocation. There is a mildly displaced left parasymphyseal fracture. There is a total left hip arthroplasty. IMPRESSION: Mildly displaced left parasymphyseal fracture. Electronically Signed   By: Elige Ko   On: 10/18/2015 14:44    Garlon Hatchet, PA-C 10/18/15 2034  Lorre Nick, MD 10/20/15 [phone]

## 2015-10-18 NOTE — ED Notes (Signed)
Meal given per Dr. Lovell Sheehan

## 2015-10-19 DIAGNOSIS — M4856XA Collapsed vertebra, not elsewhere classified, lumbar region, initial encounter for fracture: Secondary | ICD-10-CM | POA: Diagnosis not present

## 2015-10-19 DIAGNOSIS — M8448XD Pathological fracture, other site, subsequent encounter for fracture with routine healing: Secondary | ICD-10-CM | POA: Diagnosis not present

## 2015-10-19 DIAGNOSIS — N390 Urinary tract infection, site not specified: Secondary | ICD-10-CM | POA: Diagnosis not present

## 2015-10-19 DIAGNOSIS — I10 Essential (primary) hypertension: Secondary | ICD-10-CM | POA: Diagnosis not present

## 2015-10-19 LAB — BASIC METABOLIC PANEL
Anion gap: 6 (ref 5–15)
BUN: 34 mg/dL — AB (ref 6–20)
CO2: 27 mmol/L (ref 22–32)
CREATININE: 0.67 mg/dL (ref 0.44–1.00)
Calcium: 9.4 mg/dL (ref 8.9–10.3)
Chloride: 111 mmol/L (ref 101–111)
Glucose, Bld: 104 mg/dL — ABNORMAL HIGH (ref 65–99)
POTASSIUM: 4.4 mmol/L (ref 3.5–5.1)
SODIUM: 144 mmol/L (ref 135–145)

## 2015-10-19 LAB — CBC
HEMATOCRIT: 33 % — AB (ref 36.0–46.0)
Hemoglobin: 10.6 g/dL — ABNORMAL LOW (ref 12.0–15.0)
MCH: 32.9 pg (ref 26.0–34.0)
MCHC: 32.1 g/dL (ref 30.0–36.0)
MCV: 102.5 fL — ABNORMAL HIGH (ref 78.0–100.0)
Platelets: 210 10*3/uL (ref 150–400)
RBC: 3.22 MIL/uL — AB (ref 3.87–5.11)
RDW: 14.2 % (ref 11.5–15.5)
WBC: 7.2 10*3/uL (ref 4.0–10.5)

## 2015-10-19 MED ORDER — HYDRALAZINE HCL 20 MG/ML IJ SOLN
5.0000 mg | Freq: Four times a day (QID) | INTRAMUSCULAR | Status: DC | PRN
  Administered 2015-10-20 (×2): 5 mg via INTRAVENOUS
  Filled 2015-10-19 (×2): qty 1

## 2015-10-19 NOTE — Progress Notes (Signed)
PROGRESS NOTE    Katherine Kirby  GNF:[id number]  DOB: September 27, 1921  DOA: 10/18/2015 PCP: No primary care provider on file. Outpatient Specialists:   Hospital course: 82 year old female patient with history of HTN, spinal stenosis, brought by family to [facility] ED on 10/18/15 due to increasing low back pain, bilateral hip pain and difficulty walking for the past week. No history of fall or trauma. She was seen by orthopedics/Dr. Gery Pray [facility] orthopedics), x-rays were performed and prednisone taper ordered. Despite this pain was uncontrolled. She had an appointment to be seen again by orthopedics 2/15 but presented to ED. Evaluation in ED revealed x-rays with L1 compression fracture and mildly displaced left parasymphyseal fracture. Urine microscopy suggestive of UTI. Interventional radiology consulted.   Assessment & Plan:   1. L1 fracture: No history of fall or trauma. This was seen on x-ray of lumbar spine. IR was consulted and have requested MRI of T and L-spine for further evaluation. Pain control. 2. Mildly displaced left parasymphyseal fracture/pelvic fracture: Reviewed case and imaging with patient's primary orthopedic M.D. Dr. Renaye Rakers: Recommends nonoperative management with pain control and weightbearing as tolerated. 3. Escherichia coli UTI: Continue IV Rocephin pending urine culture results. 4. Macrocytic anemia: Stable. 5. Essential hypertension: Mildly uncontrolled. Likely precipitated by pain. Continue metoprolol. When necessary IV hydralazine. 6. Spinal stenosis: Follow MRI results.  DVT prophylaxis:  Lovenox Code Status:  DO NOT RESUSCITATE Family Communication:  Discussed at length with patient's daughter-in-law at bedside on 10/19/15. Disposition Plan:  Discharge when medically stable. Location to be determined based on therapy evaluation.   Consultants:   Interventional radiology  Procedures:   None  Antimicrobials:   IV Rocephin  2/13 >   Subjective:  Low back pain. Intermittent dysuria.  Objective: Filed Vitals:   10/19/15 0326 10/19/15 0623 10/19/15 1112 10/19/15 1412  BP: 191/82 185/76 172/67 156/78  Pulse: 73 73 65 85  Temp: 97.8 F (36.6 C) 97.8 F (36.6 C)  98.1 F (36.7 C)  TempSrc: Oral Oral  Oral  Resp: Height:      Weight:      SpO2: 99% 99%  95%    Intake/Output Summary (Last 24 hours) at 10/19/15 1720 Last data filed at 10/19/15 0843  Gross per 24 hour  Intake    120 ml  Output      0 ml  Net    120 ml   Filed Weights   10/18/15 2050  Weight: 45.36 kg (100 lb)    Exam:  General exam:  Pleasant elderly frail female patient lying comfortably supine in bed. Respiratory system: Clear. No increased work of breathing. Cardiovascular system: S1 & S2 heard, RRR. No JVD, murmurs, gallops, clicks or pedal edema. Gastrointestinal system: Abdomen is nondistended, soft and nontender. Normal bowel sounds heard. Central nervous system: Alert and oriented 2 . No focal neurological deficits. Extremities: Symmetric 5 x 5 power.   Data Reviewed: Basic Metabolic Panel:  Recent Labs Lab 10/18/15 1701 10/18/15 1728 10/19/15 0440  NA 143 144 144  K 3.9 3.8 4.4  CL 113* 109 111  CO2 24  --  27  GLUCOSE 108* 102* 104*  BUN 37* 34* 34*  CREATININE 0.69 0.70 0.67  CALCIUM 8.4*  --  9.4   Liver Function Tests: No results for input(s): AST, ALT, ALKPHOS, BILITOT, PROT, ALBUMIN in the last 168 hours. No results for input(s): LIPASE, AMYLASE in the last 168 hours. No results for  input(s): AMMONIA in the last 168 hours. CBC:  Recent Labs Lab 10/18/15 1701 10/18/15 1728 10/19/15 0440  WBC 6.9  --  7.2  NEUTROABS 5.8  --   --   HGB 9.2* 9.9* 10.6*  HCT 28.0* 29.0* 33.0*  MCV 99.3  --  102.5*  PLT 181  --  210   Cardiac Enzymes: No results for input(s): CKTOTAL, CKMB, CKMBINDEX, TROPONINI in the last 168 hours. BNP (last 3 results) No results for input(s): PROBNP in the  last 8760 hours. CBG: No results for input(s): GLUCAP in the last 168 hours.  Recent Results (from the past 240 hour(s))  Urine culture     Status: None (Preliminary result)   Collection Time: 10/18/15  3:43 PM  Result Value Ref Range Status   Specimen Description URINE, CLEAN CATCH  Final   Special Requests Normal  Final   Culture   Final    >=100,000 COLONIES/mL ESCHERICHIA COLI Performed at [facility]    Report Status PENDING  Incomplete         Studies: Dg Lumbar Spine Complete  10/18/2015  CLINICAL DATA:  Bilateral groin and low back pain for 1 week. No known injury. Initial encounter. EXAM: LUMBAR SPINE - COMPLETE 4+ VIEW COMPARISON:  None. FINDINGS: The patient has a mild superior endplate compression fracture of L1 with approximately 25-30% vertebral body height loss. Although the fracture cannot be definitively characterized, fracture margins appear somewhat sharp suggesting acute or subacute injury. Vertebral body height is otherwise normal. There is trace anterolisthesis L5 on S1 due to facet arthropathy. Scattered mild loss of disc space height is noted. IMPRESSION: Mild L1 superior endplate compression fracture not be definitively characterized but appears acute or subacute. Electronically Signed   By: Drusilla Kanner M.D.   On: 10/18/2015 14:44   Dg Hips Bilat With Pelvis Min 5 Views  10/18/2015  CLINICAL DATA:  Bilateral groin pain, low back pain EXAM: DG HIP (WITH OR WITHOUT PELVIS) 5+V BILAT COMPARISON:  None. FINDINGS: There is no evidence of hip fracture or dislocation. There is a mildly displaced left parasymphyseal fracture. There is a total left hip arthroplasty. IMPRESSION: Mildly displaced left parasymphyseal fracture. Electronically Signed   By: Elige Ko   On: 10/18/2015 14:44        Scheduled Meds: . aspirin  81 mg Oral Daily  . calcium carbonate  1,250 mg Oral Daily  . cefTRIAXone (ROCEPHIN)  IV  1 g Intravenous Q24H  . enoxaparin  (LOVENOX) injection  30 mg Subcutaneous Q24H  . metoprolol succinate  25 mg Oral Daily   Continuous Infusions: . sodium chloride 50 mL/hr at 10/18/15 2139    Principal Problem:   Compression fracture of lumbar spine, non-traumatic Active Problems:   Lumbar compression fracture (HCC)   Spinal stenosis   Difficulty walking   UTI (lower urinary tract infection)   AKI (acute kidney injury) (HCC)   Hypertension    Time spent: 35 minutes.    Marcellus Scott, MD, FACP, FHM. [facility] Pager [phone]  If 7PM-7AM, please contact night-coverage [email or web address] Password [email or web address] 10/19/2015, 5:20 PM    LOS: 1 day

## 2015-10-19 NOTE — Progress Notes (Signed)
Patient ID: Katherine Kirby, female   DOB: June 15, 1922, 82 y.o.   MRN: [id number] We are aware of request for kyphoplasty.  We will need an MRI prior to being able to determine whether she is a candidate for this procedure.  I have discussed this with the patient and her daughter who is at the bedside.  She is tender some in her thoracic spine as well so we will get an MRI of T,L spine.  We will follow up after her MRI is complete.  Velta Rockholt E 11:07 AM 10/19/2015

## 2015-10-20 ENCOUNTER — Observation Stay (HOSPITAL_COMMUNITY): Payer: Medicare Other

## 2015-10-20 DIAGNOSIS — S32010A Wedge compression fracture of first lumbar vertebra, initial encounter for closed fracture: Secondary | ICD-10-CM | POA: Diagnosis not present

## 2015-10-20 DIAGNOSIS — S32000S Wedge compression fracture of unspecified lumbar vertebra, sequela: Secondary | ICD-10-CM | POA: Diagnosis not present

## 2015-10-20 DIAGNOSIS — M8448XD Pathological fracture, other site, subsequent encounter for fracture with routine healing: Secondary | ICD-10-CM

## 2015-10-20 DIAGNOSIS — I10 Essential (primary) hypertension: Secondary | ICD-10-CM

## 2015-10-20 DIAGNOSIS — M4856XA Collapsed vertebra, not elsewhere classified, lumbar region, initial encounter for fracture: Secondary | ICD-10-CM | POA: Diagnosis not present

## 2015-10-20 DIAGNOSIS — M4807 Spinal stenosis, lumbosacral region: Secondary | ICD-10-CM

## 2015-10-20 DIAGNOSIS — M549 Dorsalgia, unspecified: Secondary | ICD-10-CM | POA: Diagnosis not present

## 2015-10-20 DIAGNOSIS — N390 Urinary tract infection, site not specified: Secondary | ICD-10-CM

## 2015-10-20 LAB — URINE CULTURE
Culture: >=100000
Special Requests: NORMAL

## 2015-10-20 MED ORDER — BISACODYL 5 MG PO TBEC: 10.0000 mg | DELAYED_RELEASE_TABLET | Freq: Every day | ORAL | Status: DC | PRN

## 2015-10-20 MED ORDER — BISACODYL 10 MG RE SUPP
10.0000 mg | Freq: Once | RECTAL | Status: AC
  Administered 2015-10-20: 10 mg via RECTAL
  Filled 2015-10-20: qty 1

## 2015-10-20 MED ORDER — SENNOSIDES-DOCUSATE SODIUM 8.6-50 MG PO TABS
2.0000 | ORAL_TABLET | Freq: Two times a day (BID) | ORAL | Status: DC
  Administered 2015-10-20 – 2015-10-21 (×2): 2 via ORAL
  Filled 2015-10-20 (×3): qty 2

## 2015-10-20 MED ORDER — POLYETHYLENE GLYCOL 3350 17 G PO PACK
34.0000 g | PACK | Freq: Once | ORAL | Status: AC
  Administered 2015-10-20: 34 g via ORAL

## 2015-10-20 NOTE — Care Management Obs Status (Signed)
MEDICARE OBSERVATION STATUS NOTIFICATION   Patient Details  Name: Katherine Kirby MRN: [id number] Date of Birth: Feb 24, 1922   Medicare Observation Status Notification Given:  Yes  MOON and C44 given; original to CM file.  Yves Dill, RN 10/20/2015, 11:00 AM

## 2015-10-20 NOTE — Progress Notes (Signed)
PROGRESS NOTE    Aerielle Stoklosa  ZOX:[id number]  DOB: August 20, 1922  DOA: 10/18/2015 PCP: No primary care provider on file. Outpatient Specialists:   Hospital course: 82 year old female patient with history of HTN, spinal stenosis, brought by family to [facility] ED on 10/18/15 due to increasing low back pain, bilateral hip pain and difficulty walking for the past week. No history of fall or trauma. She was seen by orthopedics/Dr. Gery Pray [facility] orthopedics), x-rays were performed and prednisone taper ordered. Despite this pain was uncontrolled. She had an appointment to be seen again by orthopedics 2/15 but presented to ED. Evaluation in ED revealed x-rays with L1 compression fracture and mildly displaced left parasymphyseal fracture. Urine microscopy suggestive of UTI. Interventional radiology consulted.   Assessment & Plan:  L1 fracture:  - No history of fall or trauma. This was seen on x-ray of lumbar spine. IR was consulted and have requested MRI of T and L-spine for further evaluation. Pain control.  Mildly displaced left parasymphyseal fracture/pelvic fracture - Dr. Waymon Amato Reviewed case and imaging with patient's primary orthopedic M.D. Dr. Renaye Rakers: Recommends nonoperative management with pain control and weightbearing as tolerated.  Escherichia coli UTI:  - Continue IV Rocephin pending urine culture results.  Macrocytic anemia:  - Stable.  Essential hypertension:  - Mildly uncontrolled. Likely precipitated by pain. Continue metoprolol. When necessary IV hydralazine.  Spinal stenosis:  - Follow MRI results.  Constipation - We'll start on bowel regimen, most likely related to pain medication  DVT prophylaxis:  Lovenox Code Status:  DO NOT RESUSCITATE Family Communication:  Discussed at length with patient's daughter-in-law at bedside on 2/15 Disposition Plan:  Discharge when medically stable. Location to be determined based on therapy  evaluation.   Consultants:   Interventional radiology  Procedures:   None  Antimicrobials:   IV Rocephin 2/13 >   Subjective:  Low back pain. Reports pain is currently controlled as she just received some IV pain medication, reports constipation, denies any chest pain or shortness of breath  Objective: Filed Vitals:   10/20/15 0437 10/20/15 0643 10/20/15 1011 10/20/15 1444  BP: 182/94 145/56 153/70 135/62  Pulse: 77 81 91 88  Temp: 98.2 F (36.8 C) 98.1 F (36.7 C) 98.5 F (36.9 C) 98.6 F (37 C)  TempSrc:  Oral Oral Oral  Resp: Height:      Weight:      SpO2: 100% 98% 96% 94%    Intake/Output Summary (Last 24 hours) at 10/20/15 1540 Last data filed at 10/20/15 1445  Gross per 24 hour  Intake   1280 ml  Output      0 ml  Net   1280 ml   Filed Weights   10/18/15 2050  Weight: 45.36 kg (100 lb)    Exam:  General exam:  Pleasant elderly frail female patient lying comfortably supine in bed. Respiratory system: Clear. No increased work of breathing. Cardiovascular system: S1 & S2 heard, RRR. No JVD, murmurs, gallops, clicks or pedal edema. Gastrointestinal system: Abdomen is nondistended, soft and nontender. Normal bowel sounds heard. Central nervous system: Alert and oriented 2 . No focal neurological deficits. Extremities: Symmetric 5 x 5 power.   Data Reviewed: Basic Metabolic Panel:  Recent Labs Lab 10/18/15 1701 10/18/15 1728 10/19/15 0440  NA 143 144 144  K 3.9 3.8 4.4  CL 113* 109 111  CO2 24  --  27  GLUCOSE 108* 102* 104*  BUN 37* 34*  34*  CREATININE 0.69 0.70 0.67  CALCIUM 8.4*  --  9.4   Liver Function Tests: No results for input(s): AST, ALT, ALKPHOS, BILITOT, PROT, ALBUMIN in the last 168 hours. No results for input(s): LIPASE, AMYLASE in the last 168 hours. No results for input(s): AMMONIA in the last 168 hours. CBC:  Recent Labs Lab 10/18/15 1701 10/18/15 1728 10/19/15 0440  WBC 6.9  --  7.2  NEUTROABS 5.8   --   --   HGB 9.2* 9.9* 10.6*  HCT 28.0* 29.0* 33.0*  MCV 99.3  --  102.5*  PLT 181  --  210   Cardiac Enzymes: No results for input(s): CKTOTAL, CKMB, CKMBINDEX, TROPONINI in the last 168 hours. BNP (last 3 results) No results for input(s): PROBNP in the last 8760 hours. CBG: No results for input(s): GLUCAP in the last 168 hours.  Recent Results (from the past 240 hour(s))  Urine culture     Status: None   Collection Time: 10/18/15  3:43 PM  Result Value Ref Range Status   Specimen Description URINE, CLEAN CATCH  Final   Special Requests Normal  Final   Culture   Final    >=100,000 COLONIES/mL ESCHERICHIA COLI Performed at [facility]    Report Status 10/20/2015 FINAL  Final   Organism ID, Bacteria ESCHERICHIA COLI  Final      Susceptibility   Escherichia coli - MIC*    AMPICILLIN 4 SENSITIVE Sensitive     CEFAZOLIN <=4 SENSITIVE Sensitive     CEFTRIAXONE <=1 SENSITIVE Sensitive     CIPROFLOXACIN <=0.25 SENSITIVE Sensitive     GENTAMICIN <=1 SENSITIVE Sensitive     IMIPENEM <=0.25 SENSITIVE Sensitive     NITROFURANTOIN 32 SENSITIVE Sensitive     TRIMETH/SULFA <=20 SENSITIVE Sensitive     AMPICILLIN/SULBACTAM <=2 SENSITIVE Sensitive     PIP/TAZO <=4 SENSITIVE Sensitive     * >=100,000 COLONIES/mL ESCHERICHIA COLI         Studies: No results found.      Scheduled Meds: . aspirin  81 mg Oral Daily  . calcium carbonate  1,250 mg Oral Daily  . cefTRIAXone (ROCEPHIN)  IV  1 g Intravenous Q24H  . enoxaparin (LOVENOX) injection  30 mg Subcutaneous Q24H  . metoprolol succinate  25 mg Oral Daily   Continuous Infusions: . sodium chloride 50 mL/hr at 10/20/15 1355    Principal Problem:   Compression fracture of lumbar spine, non-traumatic Active Problems:   Lumbar compression fracture (HCC)   Spinal stenosis   Difficulty walking   UTI (lower urinary tract infection)   AKI (acute kidney injury) (HCC)   Hypertension    Time spent: 25  minutes.    Huey Bienenstock, MD [phone] [facility] Pager [phone]  If 7PM-7AM, please contact night-coverage [email or web address] Password [email or web address] 10/20/2015, 3:40 PM    LOS: 2 days

## 2015-10-20 NOTE — Progress Notes (Signed)
CSW met with pt / daughter this am to assist with d/c planning. Daughter reports that pt will return home at d/c. [facility] is assisting with d/c planning. CSW will remain available to assist with d/c planning in case plan changes and SNF placement is requested.  Werner Lean LCSW [phone]

## 2015-10-20 NOTE — Care Management Note (Signed)
Case Management Note  Patient Details  Name: Kimyatta Lecy MRN: [id number] Date of Birth: 17-Aug-1922  Subjective/Objective:                  L1 compression fx (non-trauma) Action/Plan: Discharge planning Expected Discharge Date:   [facility])               Expected Discharge Plan:  Home w Home Health Services  In-House Referral:     Discharge planning Services  CM Consult  Post Acute Care Choice:    Choice offered to:     DME Arranged:    DME Agency:  Other - Comment  HH Arranged:  PT HH Agency:     Status of Service:  In process, will continue to follow  Medicare Important Message Given:    Date Medicare IM Given:    Medicare IM give by:    Date Additional Medicare IM Given:    Additional Medicare Important Message give by:     If discussed at Long Length of Stay Meetings, dates discussed:    Additional Comments: CM has talked extensively with pt and daughter in [place], Lurena Joiner [phone].  Lurena Joiner states pt is active with [facility] and Tarri Glenn is PT [phone]. Waiting on MRI to see if pt is candidate for kyphoplasty. Yves Dill, RN 10/20/2015, 1:22 PM

## 2015-10-21 DIAGNOSIS — M8448XD Pathological fracture, other site, subsequent encounter for fracture with routine healing: Secondary | ICD-10-CM | POA: Diagnosis not present

## 2015-10-21 DIAGNOSIS — N390 Urinary tract infection, site not specified: Secondary | ICD-10-CM | POA: Diagnosis not present

## 2015-10-21 DIAGNOSIS — I10 Essential (primary) hypertension: Secondary | ICD-10-CM | POA: Diagnosis not present

## 2015-10-21 DIAGNOSIS — M4856XA Collapsed vertebra, not elsewhere classified, lumbar region, initial encounter for fracture: Secondary | ICD-10-CM | POA: Diagnosis not present

## 2015-10-21 MED ORDER — OXYCODONE HCL 5 MG PO TABS: 5.0000 mg | ORAL_TABLET | ORAL | Status: DC | PRN

## 2015-10-21 MED ORDER — BISACODYL 5 MG PO TBEC
10.0000 mg | DELAYED_RELEASE_TABLET | Freq: Every day | ORAL | Status: DC | PRN
Start: 2015-10-21 — End: 2017-09-24

## 2015-10-21 MED ORDER — CALCIUM CARBONATE 1250 (500 CA) MG PO TABS
1500.0000 mg | ORAL_TABLET | Freq: Two times a day (BID) | ORAL | Status: DC
  Filled 2015-10-21 (×2): qty 1

## 2015-10-21 MED ORDER — ACETAMINOPHEN 325 MG PO TABS: 650.0000 mg | ORAL_TABLET | Freq: Four times a day (QID) | ORAL | Status: DC | PRN

## 2015-10-21 NOTE — Progress Notes (Signed)
[facility]   [facility] is providing the following services: Wheelchair  If patient discharges after hours, please call [phone].   Renard Hamper 10/21/2015, 2:40 PM

## 2015-10-21 NOTE — Discharge Summary (Signed)
Katherine Kirby, is a 82 y.o. female  DOB 03-Nov-1921  MRN [id number].  Admission date:  10/18/2015  Admitting Physician  Katherine Parker, MD  Discharge Date:  10/21/2015   Primary MD  No primary Kirby provider on file.  Recommendations for primary Kirby physician for things to follow:  - Please check CBC, BMP during next visit   Admission Diagnosis  Hip pain [M25.559] UTI (lower urinary tract infection) [N39.0] Lumbar compression fracture (HCC) [S32.000A] Pelvic fracture, closed, initial encounter [W09.9XXA] Compression fracture of L1 lumbar vertebra, closed, initial encounter (HCC) [S32.010A] Unable to ambulate [R26.2]   Discharge Diagnosis  Hip pain [M25.559] UTI (lower urinary tract infection) [N39.0] Lumbar compression fracture (HCC) [S32.000A] Pelvic fracture, closed, initial encounter [W11.9XXA] Compression fracture of L1 lumbar vertebra, closed, initial encounter (HCC) [S32.010A] Unable to ambulate [R26.2]    Principal Problem:   Compression fracture of lumbar spine, non-traumatic Active Problems:   Lumbar compression fracture (HCC)   Spinal stenosis   Difficulty walking   UTI (lower urinary tract infection)   AKI (acute kidney injury) (HCC)   Hypertension      Past Medical History  Diagnosis Date  . Hypertension     History reviewed. No pertinent past surgical history.     History of present illness and  K[facility]by Course:     Kindly see H&P for history of present illness and admission details, please review complete Labs, Consult reports and Test reports for all details in brief  HPI  from the history and physical done on the day of admission   HPI: Katherine Kirby is a 82 y.o. female with a history of HTN, Spinal Stenosis who was brought to the ED by her family due to increased low back and bilateral hip pain and difficulty walking for the past week. She denies any fall  or trauma. She had been taken to the Orthopedics Dr Katherine Kirby and had x-rays performed and was prescribed a prednisone taper. In the ED, she was evaluated and found to have a L-1 compression fracture, and a mildy displaced Left Parasymphyseal Fracture on X-rays. She was referred for admission. She was also found to have a UTI and was placed on IV Rocephin  K[facility]by Course  82 year old female patient with history of HTN, spinal stenosis, brought by family to [facility] on 10/18/15 due to increasing low back pain, bilateral hip pain and difficulty walking for the past week. No history of fall or trauma. She was seen by orthopedics/Dr. Gery Pray [facility]), x-rays were performed and prednisone taper ordered. Despite this pain was uncontrolled. She had an appointment to be seen again by orthopedics 2/15 but presented to ED. Evaluation in ED revealed x-rays with L1 compression fracture and mildly displaced left parasymphyseal fracture. Urine microscopy suggestive of UTI. Interventional radiology consulted.   L1 fracture:  - No history of fall or trauma. This was seen on x-ray of lumbar spine. IR was consulted and have requested MRI of T and L-spine for further evaluation, MRI thoracic  and lumbar spine was significant for multiple old slight compression fracture, old healed, L1 fracture appears to be old, and no intervention is indicated by IR as all her fractures are not acute. - Continue with PT, and pain medication as needed.  Mildly displaced left parasymphyseal fracture/pelvic fracture - Dr. Waymon Kirby Reviewed case and imaging with patient's primary orthopedic M.D. Dr. Renaye Kirby: Recommends nonoperative management with pain control and weightbearing as tolerated. - Continue with PT, and pain medication as needed  Escherichia coli UTI:  - Continue IV Rocephin total of 3 days, urine culture growing Escherichia coli, sensitive to Rocephin. No need for antibiotic on  discharge  Macrocytic anemia:  - Stable.  Essential hypertension:  - Mildly uncontrolled. Likely precipitated by pain. Continue metoprolol.  Spinal stenosis:  - PT and pain medication as needed  Constipation - on bowel regimen, most likely related to pain medication   Discharge Condition: Stable   Follow UP  Follow-up Information    Follow up with Katherine Kirby.   Specialty:  Home Health Services   Why:  Katherine Kirby has been requested for your home health services   Contact information:   [address] [place] [address] [phone]         Discharge Instructions  and  Discharge Medications     Discharge Instructions    Diet - low sodium heart healthy    Complete by:  As directed      Discharge instructions    Complete by:  As directed   Follow with Primary MD  in 7 days   Get CBC, CMP,checked  by Primary MD next visit.    Activity: As tolerated with Full fall precautions use walker/cane & assistance as needed   Disposition Home   Diet: Heart Healthy  , with feeding assistance and aspiration precautions.  For Heart failure patients - Check your Weight same time everyday, if you gain over 2 pounds, or you develop in leg swelling, experience more shortness of breath or chest pain, call your Primary MD immediately. Follow Cardiac Low Salt Diet and 1.5 lit/day fluid restriction.   On your next visit with your primary Kirby physician please Get Medicines reviewed and adjusted.   Please request your Prim.MD to go over all Kirby Tests and Procedure/Radiological results at the follow up, please get all Kirby records sent to your Prim MD by signing Kirby release before you go home.   If you experience worsening of your admission symptoms, develop shortness of breath, life threatening emergency, suicidal or homicidal thoughts you must seek medical attention immediately by calling 911 or calling your MD immediately  if symptoms less severe.  You  Must read complete instructions/literature along with all the possible adverse reactions/side effects for all the Medicines you take and that have been prescribed to you. Take any new Medicines after you have completely understood and accpet all the possible adverse reactions/side effects.   Do not drive, operating heavy machinery, perform activities at heights, swimming or participation in water activities or provide baby sitting services if your were admitted for syncope or siezures until you have seen by Primary MD or a Neurologist and advised to do so again.  Do not drive when taking Pain medications.    Do not take more than prescribed Pain, Sleep and Anxiety Medications  Special Instructions: If you have smoked or chewed Tobacco  in the last 2 yrs please stop smoking, stop any regular Alcohol  and or any Recreational drug  use.  Wear Seat belts while driving.   Please note  You were cared for by a hospitalist during your Kirby stay. If you have any questions about your discharge medications or the Kirby you received while you were in the Kirby after you are discharged, you can call the unit and asked to speak with the hospitalist on call if the hospitalist that took Kirby of you is not available. Once you are discharged, your primary Kirby physician will handle any further medical issues. Please note that NO REFILLS for any discharge medications will be authorized once you are discharged, as it is imperative that you return to your primary Kirby physician (or establish a relationship with a primary Kirby physician if you do not have one) for your aftercare needs so that they can reassess your need for medications and monitor your lab values.     Increase activity slowly    Complete by:  As directed             Medication List    STOP taking these medications        predniSONE 5 MG tablet  Commonly known as:  DELTASONE      TAKE these medications        acetaminophen 325 MG  tablet  Commonly known as:  TYLENOL  Take 2 tablets (650 mg total) by mouth every 6 (six) hours as needed for mild pain (or Fever >/= 101).     aspirin 81 MG chewable tablet  Chew 81 mg by mouth daily.     bisacodyl 5 MG EC tablet  Commonly known as:  DULCOLAX  Take 2 tablets (10 mg total) by mouth daily as needed for moderate constipation.     calcium carbonate 1500 (600 Ca) MG Tabs tablet  Commonly known as:  OSCAL  Take 1,500 mg by mouth daily.     cyclobenzaprine 10 MG tablet  Commonly known as:  FLEXERIL  Take 5-10 mg by mouth 3 (three) times daily as needed for muscle spasms.     metoprolol succinate 25 MG 24 hr tablet  Commonly known as:  TOPROL-XL  Take 25 mg by mouth daily.     oxyCODONE 5 MG immediate release tablet  Commonly known as:  Oxy IR/ROXICODONE  Take 1 tablet (5 mg total) by mouth every 4 (four) hours as needed for moderate pain.     VITAMIN B 12 PO  Take 1 tablet by mouth daily.          Diet and Activity recommendation: See Discharge Instructions above   Consults obtained -  IR   Major procedures and Radiology Reports - PLEASE review detailed and final reports for all details, in brief -      Dg Lumbar Spine Complete  10/18/2015  CLINICAL DATA:  Bilateral groin and low back pain for 1 week. No known injury. Initial encounter. EXAM: LUMBAR SPINE - COMPLETE 4+ VIEW COMPARISON:  None. FINDINGS: The patient has a mild superior endplate compression fracture of L1 with approximately 25-30% vertebral body height loss. Although the fracture cannot be definitively characterized, fracture margins appear somewhat sharp suggesting acute or subacute injury. Vertebral body height is otherwise normal. There is trace anterolisthesis L5 on S1 due to facet arthropathy. Scattered mild loss of disc space height is noted. IMPRESSION: Mild L1 superior endplate compression fracture not be definitively characterized but appears acute or subacute. Electronically Signed    By: Drusilla Kanner M.D.   On: 10/18/2015 14:44  Mr Thoracic Spine Wo Contrast  10/20/2015  CLINICAL DATA:  Mid and low back pain.  L1 compression fracture. EXAM: MRI THORACIC AND LUMBAR SPINE WITHOUT CONTRAST TECHNIQUE: Multiplanar and multiecho pulse sequences of the thoracic and lumbar spine were obtained without intravenous contrast. COMPARISON:  Radiographs dated 10/18/2015 FINDINGS: MR THORACIC SPINE FINDINGS There are no acute fractures. There are old slight healed compression fractures of the superior endplate of L1 as well as the of the inferior endplate of T3 and the superior endplate of T4. There is a slight wedge deformity of T2. The thoracic spinal cord is normal. There are no disc protrusions. No spinal or foraminal stenosis. Normal conus tip at L1-2. Slight facet arthritis in the upper thoracic spine at T1-2 and T2-3. Multilevel degenerative disc disease in the cervical spine with reversal of the normal cervical lordosis. Tiny bilateral pleural effusions. MR LUMBAR SPINE FINDINGS Normal conus tip at L1-2. T12-L1: Old slight compression fracture of the superior endplate of L1. No disc or bone protrusion into the spinal canal. L1-2:  Normal. L2-3: Normal disc. Moderately severe right and moderate left facet arthritis. L3-4: Tiny disc bulges into the neural foramina. Severe right and moderately severe left facet arthritis with hypertrophy of the right ligamentum flavum with minimal narrowing of the spinal canal. L4-5: 5 mm spondylolisthesis. Tiny bulge of the uncovered disc into the left neural foramen without neural impingement. Severe bilateral facet arthritis. No spinal stenosis. No foraminal stenosis. L5-S1: Small broad-based disc bulge. Moderately severe bilateral facet arthritis. No neural impingement. IMPRESSION: MR THORACIC SPINE IMPRESSION 1. No acute abnormalities of the thoracic spine. Multiple old slight compression fractures, all healed. 2. Slight facet arthritis in the upper thoracic  spine. MR LUMBAR SPINE IMPRESSION 1. Old compression fracture of the superior endplate of L1. 2. Multilevel severe to moderately severe facet arthritis at L2-3, L3-4, L4-5, and L5-S1 with grade 1 spondylolisthesis at L4-5. 3. No severe spinal stenosis or focal neural impingement. Electronically Signed   By: Francene Boyers M.D.   On: 10/20/2015 16:51   Mr Lumbar Spine Wo Contrast  10/20/2015  CLINICAL DATA:  Mid and low back pain.  L1 compression fracture. EXAM: MRI THORACIC AND LUMBAR SPINE WITHOUT CONTRAST TECHNIQUE: Multiplanar and multiecho pulse sequences of the thoracic and lumbar spine were obtained without intravenous contrast. COMPARISON:  Radiographs dated 10/18/2015 FINDINGS: MR THORACIC SPINE FINDINGS There are no acute fractures. There are old slight healed compression fractures of the superior endplate of L1 as well as the of the inferior endplate of T3 and the superior endplate of T4. There is a slight wedge deformity of T2. The thoracic spinal cord is normal. There are no disc protrusions. No spinal or foraminal stenosis. Normal conus tip at L1-2. Slight facet arthritis in the upper thoracic spine at T1-2 and T2-3. Multilevel degenerative disc disease in the cervical spine with reversal of the normal cervical lordosis. Tiny bilateral pleural effusions. MR LUMBAR SPINE FINDINGS Normal conus tip at L1-2. T12-L1: Old slight compression fracture of the superior endplate of L1. No disc or bone protrusion into the spinal canal. L1-2:  Normal. L2-3: Normal disc. Moderately severe right and moderate left facet arthritis. L3-4: Tiny disc bulges into the neural foramina. Severe right and moderately severe left facet arthritis with hypertrophy of the right ligamentum flavum with minimal narrowing of the spinal canal. L4-5: 5 mm spondylolisthesis. Tiny bulge of the uncovered disc into the left neural foramen without neural impingement. Severe bilateral facet arthritis. No spinal stenosis. No  foraminal  stenosis. L5-S1: Small broad-based disc bulge. Moderately severe bilateral facet arthritis. No neural impingement. IMPRESSION: MR THORACIC SPINE IMPRESSION 1. No acute abnormalities of the thoracic spine. Multiple old slight compression fractures, all healed. 2. Slight facet arthritis in the upper thoracic spine. MR LUMBAR SPINE IMPRESSION 1. Old compression fracture of the superior endplate of L1. 2. Multilevel severe to moderately severe facet arthritis at L2-3, L3-4, L4-5, and L5-S1 with grade 1 spondylolisthesis at L4-5. 3. No severe spinal stenosis or focal neural impingement. Electronically Signed   By: Francene Boyers M.D.   On: 10/20/2015 16:51   Dg Hips Bilat With Pelvis Min 5 Views  10/18/2015  CLINICAL DATA:  Bilateral groin pain, low back pain EXAM: DG HIP (WITH OR WITHOUT PELVIS) 5+V BILAT COMPARISON:  None. FINDINGS: There is no evidence of hip fracture or dislocation. There is a mildly displaced left parasymphyseal fracture. There is a total left hip arthroplasty. IMPRESSION: Mildly displaced left parasymphyseal fracture. Electronically Signed   By: Elige Ko   On: 10/18/2015 14:44    Micro Results     Recent Results (from the past 240 hour(s))  Urine culture     Status: None   Collection Time: 10/18/15  3:43 PM  Result Value Ref Range Status   Specimen Description URINE, CLEAN CATCH  Final   Special Requests Normal  Final   Culture   Final    >=100,000 COLONIES/mL ESCHERICHIA COLI Performed at [facility]    Report Status 10/20/2015 FINAL  Final   Organism ID, Bacteria ESCHERICHIA COLI  Final      Susceptibility   Escherichia coli - MIC*    AMPICILLIN 4 SENSITIVE Sensitive     CEFAZOLIN <=4 SENSITIVE Sensitive     CEFTRIAXONE <=1 SENSITIVE Sensitive     CIPROFLOXACIN <=0.25 SENSITIVE Sensitive     GENTAMICIN <=1 SENSITIVE Sensitive     IMIPENEM <=0.25 SENSITIVE Sensitive     NITROFURANTOIN 32 SENSITIVE Sensitive     TRIMETH/SULFA <=20 SENSITIVE Sensitive      AMPICILLIN/SULBACTAM <=2 SENSITIVE Sensitive     PIP/TAZO <=4 SENSITIVE Sensitive     * >=100,000 COLONIES/mL ESCHERICHIA COLI       Today   Subjective:   Jontavia Leatherbury today has no headache,no chest or abdominal pain, lower back pain in his controlled with pain meds.  Objective:   Blood pressure 131/76, pulse 75, temperature 98.9 F (37.2 C), temperature source Oral, resp. rate 16, height 5\' 2"  (1.575 m), weight 45.36 kg (100 lb), SpO2 99 %.   Intake/Output Summary (Last 24 hours) at 10/21/15 1750 Last data filed at 10/21/15 1203  Gross per 24 hour  Intake   1120 ml  Output      0 ml  Net   1120 ml    Exam General exam: Pleasant elderly frail female patient lying comfortably supine in bed. Respiratory system: Clear. No increased work of breathing. Cardiovascular system: S1 & S2 heard, RRR. No JVD, murmurs, gallops, clicks or pedal edema. Gastrointestinal system: Abdomen is nondistended, soft and nontender. Normal bowel sounds heard. Central nervous system: Alert and oriented 2 . No focal neurological deficits. Extremities: Symmetric 5 x 5 power.  Data Review   CBC w Diff: Lab Results  Component Value Date   WBC 7.2 10/19/2015   HGB 10.6* 10/19/2015   HCT 33.0* 10/19/2015   PLT 210 10/19/2015   LYMPHOPCT 8 10/18/2015   MONOPCT 7 10/18/2015   EOSPCT 1 10/18/2015   BASOPCT 0  10/18/2015    CMP: Lab Results  Component Value Date   NA 144 10/19/2015   K 4.4 10/19/2015   CL 111 10/19/2015   CO2 27 10/19/2015   BUN 34* 10/19/2015   CREATININE 0.67 10/19/2015  .   Total Time in preparing paper work, data evaluation and todays exam - 35 minutes  ELGERGAWY, DAWOOD M.D on 10/21/2015 at 5:50 PM  [facility]   Office  [phone]

## 2015-10-21 NOTE — Progress Notes (Addendum)
13:00 CM faxed HHPT/OT orders, facesheet, H&P, face to face, MRI result to [facility].  CM called [facility] DME rep, Lecretia to please deliver the wheelchair to room prior to discharge.  No other CM needs were communicated.  11:35 In anticipation of [facility] services being ordered, CM has called with tentative referral to [facility] agency [phone].  Pt has had HHPT with this agency in the past and is requesting Tarri Glenn as PT.  Pt lives in independent living at [facility].  CM waiting for eval recc and orders to fax to [facility] [phone].

## 2015-10-21 NOTE — Discharge Instructions (Signed)
Follow with Primary MDin 7 days  ? ?Get CBC, CMP,  checked  by Primary MD next visit.  ? ? ?Activity: As tolerated with Full fall precautions use walker/cane & assistance as needed ? ?Disposition Home ? ? ?Diet: Heart Healthy  , with feeding assistance and aspiration precautions. ? ?For Heart failure patients - Check your Weight same time everyday, if you gain over 2 pounds, or you develop in leg swelling, experience more shortness of breath or chest pain, call your Primary MD immediately. Follow Cardiac Low Salt Diet and 1.5 lit/day fluid restriction. ? ? ?On your next visit with your primary care physician please Get Medicines reviewed and adjusted. ? ? ?Please request your Prim.MD to go over all Hospital Tests and Procedure/Radiological results at the follow up, please get all Hospital records sent to your Prim MD by signing hospital release before you go home. ? ? ?If you experience worsening of your admission symptoms, develop shortness of breath, life threatening emergency, suicidal or homicidal thoughts you must seek medical attention immediately by calling 911 or calling your MD immediately  if symptoms less severe. ? ?You Must read complete instructions/literature along with all the possible adverse reactions/side effects for all the Medicines you take and that have been prescribed to you. Take any new Medicines after you have completely understood and accpet all the possible adverse reactions/side effects.  ? ?Do not drive, operating heavy machinery, perform activities at heights, swimming or participation in water activities or provide baby sitting services if your were admitted for syncope or siezures until you have seen by Primary MD or a Neurologist and advised to do so again. ? ?Do not drive when taking Pain medications.  ? ? ?Do not take more than prescribed Pain, Sleep and Anxiety Medications ? ?Special Instructions: If you have smoked or chewed Tobacco  in the last 2 yrs please stop smoking, stop  any regular Alcohol  and or any Recreational drug use. ? ?Wear Seat belts while driving. ? ? ?Please note ? ?You were cared for by a hospitalist during your hospital stay. If you have any questions about your discharge medications or the care you received while you were in the hospital after you are discharged, you can call the unit and asked to speak with the hospitalist on call if the hospitalist that took care of you is not available. Once you are discharged, your primary care physician will handle any further medical issues. Please note that NO REFILLS for any discharge medications will be authorized once you are discharged, as it is imperative that you return to your primary care physician (or establish a relationship with a primary care physician if you do not have one) for your aftercare needs so that they can reassess your need for medications and monitor your lab values.  ? ?

## 2015-10-21 NOTE — Progress Notes (Signed)
Patient ID: Katherine Kirby, female   DOB: 1921/11/19, 82 y.o.   MRN: [id number] MRI reviewed by Dr. Grace Isaac.  Unfortunately, the compression fracture of L1 noted on plain film is noted on MRI, but appears to be old.  Because this is not acute, no intervention is necessary.  Suspect the majority of her pain is coming from her pelvic fracture.  [name] E 8:46 AM 10/21/2015

## 2015-10-21 NOTE — Evaluation (Signed)
Occupational Therapy Evaluation Patient Details Name: Katherine Kirby MRN: [id number] DOB: [birth year] Today's Date: 10/21/2015    History of Present Illness Pt admitted with c/o back and bilat hip pain.  Pt with dx pelvic fx and has old compression fxs   Clinical Impression   This 82 year old female was admitted for the above. At baseline, she is mod I to independent with adls  She currently needs up to total A for ADLs due to pain and needs min A for SPT.  Pt will benefit from skilled OT to increase safety and independence. Goals in acute are for min guard to mod A.  Recommend follow up OT after acute for pt to reach her mod I/I PLOF    Follow Up Recommendations  Home health OT;Supervision/Assistance - 24 hour (family prefers to take pt home with 24/7)    Equipment Recommendations  None recommended by OT    Recommendations for Other Services       Precautions / Restrictions Precautions Precautions: Fall Restrictions Weight Bearing Restrictions: No Other Position/Activity Restrictions: WBAT      Mobility Bed Mobility Overal bed mobility: Needs Assistance Bed Mobility: Supine to Sit;Sit to Supine   Sidelying to sit: Min assist     Sit to sidelying: Mod assist General bed mobility comments: assist for legs; pt used rails  Transfers Overall transfer level: Needs assistance Equipment used: Rolling walker (2 wheeled) Transfers: Sit to/from [employer] Sit to Stand: Min assist Stand pivot transfers: Min assist       General transfer comment: assist to rise and steady    Balance Overall balance assessment: Needs assistance         Standing balance support: During functional activity Standing balance-Leahy Scale: Poor                              ADL Overall ADL's : Needs assistance/impaired     Grooming: Set up;Sitting   Upper Body Bathing: Set up;Sitting   Lower Body Bathing: Maximal assistance;Sit to/from stand   Upper  Body Dressing : Set up;Sitting   Lower Body Dressing: Total assistance;Sit to/from stand   Toilet Transfer: Minimal assistance;Stand-pivot;BSC;RW   Toileting- Clothing Manipulation and Hygiene: Total assistance;Sit to/from stand         General ADL Comments: pt has reacher at home; did not use this session.  followed back precautions with bed mobility.  Pt rolled to R and initially moved LLE backwards. She did better when legs were stacked gettting up to seated position.  Pt moved quickly with urgency/pain; cued for safety. Talked to daughter in law about using gait belt for increased safety. Daughter in law has assisted her in the past with other orthopedic injuries     Vision     Perception     Praxis      Pertinent Vitals/Pain Pain Assessment: Faces Faces Pain Scale: Hurts even more Pain Location: Back and hips Pain Descriptors / Indicators: Aching;Sore Pain Intervention(s): Limited activity within patient's tolerance;Monitored during session;Premedicated before session     Hand Dominance     Extremity/Trunk Assessment Upper Extremity Assessment Upper Extremity Assessment: Overall WFL for tasks assessed      Cervical / Trunk Assessment Cervical / Trunk Assessment: Kyphotic   Communication Communication Communication: No difficulties   Cognition Arousal/Alertness: Awake/alert Behavior During Therapy: Impulsive (With onset of pain) Overall Cognitive Status: Within Functional Limits for tasks assessed  General Comments       Exercises       Shoulder Instructions      Home Living Family/patient expects to be discharged to:: Private residence Living Arrangements: Alone Available Help at Discharge: Family Type of Home: Independent living facility Home Access: Level entry     Home Layout: One level                   Additional Comments: Daughter in law plans to stay with pt at her apt at independent living [facility]). She has a 3:1 commode and accessible shower with grab bars      Prior Functioning/Environment Level of Independence: Independent;Independent with assistive device(s)             OT Diagnosis: Generalized weakness   OT Problem List: Decreased strength;Decreased activity tolerance;Pain;Decreased knowledge of use of DME or AE;Decreased knowledge of precautions;Decreased safety awareness   OT Treatment/Interventions: Self-care/ADL training;DME and/or AE instruction;Patient/family education;Balance training;Visual/perceptual remediation/compensation    OT Goals(Current goals can be found in the care plan section) Acute Rehab OT Goals Patient Stated Goal: get back to being independent OT Goal Formulation: With patient Time For Goal Achievement: 10/28/15 Potential to Achieve Goals: Good ADL Goals Pt Will Transfer to Toilet: with modified independence;bedside commode;stand pivot transfer Pt Will Perform Toileting - Clothing Manipulation and hygiene: with mod assist;sit to/from stand Additional ADL Goal #1: pt will complete LB bathing and donning pants with reacher with min A, sit to stand Additional ADL Goal #2: pt will perform bed mobility from flat bed with min A  OT Frequency: Min 2X/week   Barriers to D/C:            Co-evaluation              End of Session    Activity Tolerance: Patient limited by pain Patient left: in bed;with call bell/phone within reach;with family/visitor present   Time: 1610-9604 OT Time Calculation (min): 24 min Charges:  OT General Charges $OT Visit: 1 Procedure OT Evaluation $OT Eval Low Complexity: 1 Procedure G-Codes: OT G-codes **NOT FOR INPATIENT CLASS** Functional Assessment Tool Used: clinical observation and judgment Functional Limitation: Self care Self Care Current Status (V4098): At least 80 percent but less than 100 percent impaired, limited or restricted Self Care Goal Status (J1914): At least 40 percent but less  than 60 percent impaired, limited or restricted  Oseph Imburgia 10/21/2015, 3:44 PM   Marica Otter, OTR/L [phone] 10/21/2015

## 2015-10-21 NOTE — Evaluation (Signed)
Physical Therapy Evaluation Patient Details Name: Katherine Kirby MRN: [id number] DOB: 26-Nov-1921 Today's Date: 10/21/2015   History of Present Illness  Pt admitted with c/o back and bilat hip pain.  Pt with dx pelvic fx  Clinical Impression  Pt admitted as above and presenting with functional mobility limitations 2* pain and questionable safety awareness with onset of pain.  Pt and family plan dc to home with 24/7 assist and will benefit from follow up HHPT and use of wc.    Follow Up Recommendations Home health PT    Equipment Recommendations  Wheelchair (measurements PT)    Recommendations for Other Services OT consult     Precautions / Restrictions Precautions Precautions: Fall Restrictions Weight Bearing Restrictions: No Other Position/Activity Restrictions: WBAT      Mobility  Bed Mobility Overal bed mobility: Needs Assistance Bed Mobility: Supine to Sit;Sit to Supine   Sidelying to sit: Min assist Supine to sit: Min assist Sit to supine: Mod assist Sit to sidelying: Mod assist General bed mobility comments: cues for sequence and use of UEs to self assist.  Pt utilizing bed rail to self assist  Transfers Overall transfer level: Needs assistance Equipment used: Rolling walker (2 wheeled) Transfers: Sit to/from Stand Sit to Stand: Min assist Stand pivot transfers: Min assist       General transfer comment: assist to rise and steady  Ambulation/Gait Ambulation/Gait assistance: Min assist Ambulation Distance (Feet): 3 Feet Assistive device: Rolling walker (2 wheeled) Gait Pattern/deviations: Step-to pattern;Decreased step length - right;Decreased step length - left;Shuffle;Trunk flexed;Narrow base of support Gait velocity: decr Gait velocity interpretation: Below normal speed for age/gender General Gait Details: Pt transferred bed <> BSC with RW and cues for posture, position from RW and saftey awareness  Stairs            Wheelchair Mobility     Modified Rankin (Stroke Patients Only)       Balance Overall balance assessment: Needs assistance Sitting-balance support: Feet supported;Bilateral upper extremity supported Sitting balance-Leahy Scale: Poor     Standing balance support: Bilateral upper extremity supported Standing balance-Leahy Scale: Poor Standing balance comment: Pain ltd                             Pertinent Vitals/Pain Pain Assessment: Faces Faces Pain Scale: Hurts even more Pain Location: Back and hips Pain Descriptors / Indicators: Aching;Sore Pain Intervention(s): Limited activity within patient's tolerance;Monitored during session;Premedicated before session    Home Living Family/patient expects to be discharged to:: Private residence Living Arrangements: Alone Available Help at Discharge: Family Type of Home: Independent living facility Home Access: Level entry     Home Layout: One level   Additional Comments: Daughter in law plans to stay with pt at her apt at independent living [facility] She has a 3:1 commode and accessible shower with grab bars    Prior Function Level of Independence: Independent;Independent with assistive device(s)               Hand Dominance        Extremity/Trunk Assessment   Upper Extremity Assessment: Overall WFL for tasks assessed           Lower Extremity Assessment: Overall WFL for tasks assessed      Cervical / Trunk Assessment: Kyphotic  Communication   Communication: No difficulties  Cognition Arousal/Alertness: Awake/alert Behavior During Therapy: Impulsive (With onset of pain) Overall Cognitive Status: Within Functional Limits for tasks assessed  General Comments      Exercises        Assessment/Plan    PT Assessment Patient needs continued PT services  PT Diagnosis Difficulty walking;Acute pain   PT Problem List Decreased strength;Decreased range of motion;Decreased  activity tolerance;Decreased mobility;Decreased balance;Decreased knowledge of use of DME;Decreased safety awareness;Pain  PT Treatment Interventions DME instruction;Gait training;Stair training;Functional mobility training;Therapeutic activities;Therapeutic exercise;Balance training;Patient/family education   PT Goals (Current goals can be found in the Care Plan section) Acute Rehab PT Goals Patient Stated Goal: get back to being independent PT Goal Formulation: With patient Time For Goal Achievement: 10/28/15 Potential to Achieve Goals: Fair    Frequency 7X/week   Barriers to discharge Other (comment) Pain control    Co-evaluation               End of Session Equipment Utilized During Treatment: Gait belt Activity Tolerance: Patient limited by pain Patient left: in bed;with call bell/phone within reach;with family/visitor present Nurse Communication: Mobility status    Functional Assessment Tool Used: Clinical judgement Functional Limitation: Mobility: Walking and moving around Mobility: Walking and Moving Around Current Status [phone]): At least 40 percent but less than 60 percent impaired, limited or restricted Mobility: Walking and Moving Around Goal Status [phone]): At least 20 percent but less than 40 percent impaired, limited or restricted    Time: 1130-1200 PT Time Calculation (min) (ACUTE ONLY): 30 min   Charges:   PT Evaluation $PT Eval Low Complexity: 1 Procedure     PT G Codes:   PT G-Codes **NOT FOR INPATIENT CLASS** Functional Assessment Tool Used: Clinical judgement Functional Limitation: Mobility: Walking and moving around Mobility: Walking and Moving Around Current Status ([id number]): At least 40 percent but less than 60 percent impaired, limited or restricted Mobility: Walking and Moving Around Goal Status [phone]): At least 20 percent but less than 40 percent impaired, limited or restricted    [facility] 10/21/2015, 3:49 PM

## 2015-10-22 DIAGNOSIS — Z8781 Personal history of (healed) traumatic fracture: Secondary | ICD-10-CM | POA: Diagnosis not present

## 2015-10-22 DIAGNOSIS — I10 Essential (primary) hypertension: Secondary | ICD-10-CM | POA: Diagnosis not present

## 2015-10-22 DIAGNOSIS — Z7982 Long term (current) use of aspirin: Secondary | ICD-10-CM | POA: Diagnosis not present

## 2015-10-22 DIAGNOSIS — Z7952 Long term (current) use of systemic steroids: Secondary | ICD-10-CM | POA: Diagnosis not present

## 2015-10-22 DIAGNOSIS — M545 Low back pain: Secondary | ICD-10-CM | POA: Diagnosis not present

## 2015-10-25 DIAGNOSIS — M545 Low back pain: Secondary | ICD-10-CM | POA: Diagnosis not present

## 2015-10-25 DIAGNOSIS — Z7952 Long term (current) use of systemic steroids: Secondary | ICD-10-CM | POA: Diagnosis not present

## 2015-10-25 DIAGNOSIS — Z8781 Personal history of (healed) traumatic fracture: Secondary | ICD-10-CM | POA: Diagnosis not present

## 2015-10-25 DIAGNOSIS — Z7982 Long term (current) use of aspirin: Secondary | ICD-10-CM | POA: Diagnosis not present

## 2015-10-25 DIAGNOSIS — I10 Essential (primary) hypertension: Secondary | ICD-10-CM | POA: Diagnosis not present

## 2015-10-26 DIAGNOSIS — I10 Essential (primary) hypertension: Secondary | ICD-10-CM | POA: Diagnosis not present

## 2015-10-26 DIAGNOSIS — M4856XD Collapsed vertebra, not elsewhere classified, lumbar region, subsequent encounter for fracture with routine healing: Secondary | ICD-10-CM | POA: Diagnosis not present

## 2015-10-26 DIAGNOSIS — K5900 Constipation, unspecified: Secondary | ICD-10-CM | POA: Diagnosis not present

## 2015-10-26 DIAGNOSIS — S329XXS Fracture of unspecified parts of lumbosacral spine and pelvis, sequela: Secondary | ICD-10-CM | POA: Diagnosis not present

## 2015-10-27 DIAGNOSIS — Z7952 Long term (current) use of systemic steroids: Secondary | ICD-10-CM | POA: Diagnosis not present

## 2015-10-27 DIAGNOSIS — Z8781 Personal history of (healed) traumatic fracture: Secondary | ICD-10-CM | POA: Diagnosis not present

## 2015-10-27 DIAGNOSIS — I10 Essential (primary) hypertension: Secondary | ICD-10-CM | POA: Diagnosis not present

## 2015-10-27 DIAGNOSIS — M545 Low back pain: Secondary | ICD-10-CM | POA: Diagnosis not present

## 2015-10-27 DIAGNOSIS — Z7982 Long term (current) use of aspirin: Secondary | ICD-10-CM | POA: Diagnosis not present

## 2015-10-28 DIAGNOSIS — M545 Low back pain: Secondary | ICD-10-CM | POA: Diagnosis not present

## 2015-10-28 DIAGNOSIS — Z7982 Long term (current) use of aspirin: Secondary | ICD-10-CM | POA: Diagnosis not present

## 2015-10-28 DIAGNOSIS — I10 Essential (primary) hypertension: Secondary | ICD-10-CM | POA: Diagnosis not present

## 2015-10-28 DIAGNOSIS — Z8781 Personal history of (healed) traumatic fracture: Secondary | ICD-10-CM | POA: Diagnosis not present

## 2015-10-28 DIAGNOSIS — Z7952 Long term (current) use of systemic steroids: Secondary | ICD-10-CM | POA: Diagnosis not present

## 2015-11-01 DIAGNOSIS — Z7952 Long term (current) use of systemic steroids: Secondary | ICD-10-CM | POA: Diagnosis not present

## 2015-11-01 DIAGNOSIS — Z8781 Personal history of (healed) traumatic fracture: Secondary | ICD-10-CM | POA: Diagnosis not present

## 2015-11-01 DIAGNOSIS — Z7982 Long term (current) use of aspirin: Secondary | ICD-10-CM | POA: Diagnosis not present

## 2015-11-01 DIAGNOSIS — M545 Low back pain: Secondary | ICD-10-CM | POA: Diagnosis not present

## 2015-11-01 DIAGNOSIS — I10 Essential (primary) hypertension: Secondary | ICD-10-CM | POA: Diagnosis not present

## 2015-11-02 DIAGNOSIS — S329XXS Fracture of unspecified parts of lumbosacral spine and pelvis, sequela: Secondary | ICD-10-CM | POA: Diagnosis not present

## 2015-11-02 DIAGNOSIS — I10 Essential (primary) hypertension: Secondary | ICD-10-CM | POA: Diagnosis not present

## 2015-11-02 DIAGNOSIS — T148 Other injury of unspecified body region: Secondary | ICD-10-CM | POA: Diagnosis not present

## 2015-11-02 DIAGNOSIS — M4856XD Collapsed vertebra, not elsewhere classified, lumbar region, subsequent encounter for fracture with routine healing: Secondary | ICD-10-CM | POA: Diagnosis not present

## 2015-11-03 DIAGNOSIS — I10 Essential (primary) hypertension: Secondary | ICD-10-CM | POA: Diagnosis not present

## 2015-11-03 DIAGNOSIS — M545 Low back pain: Secondary | ICD-10-CM | POA: Diagnosis not present

## 2015-11-03 DIAGNOSIS — Z7982 Long term (current) use of aspirin: Secondary | ICD-10-CM | POA: Diagnosis not present

## 2015-11-03 DIAGNOSIS — Z8781 Personal history of (healed) traumatic fracture: Secondary | ICD-10-CM | POA: Diagnosis not present

## 2015-11-03 DIAGNOSIS — Z7952 Long term (current) use of systemic steroids: Secondary | ICD-10-CM | POA: Diagnosis not present

## 2015-11-04 DIAGNOSIS — I10 Essential (primary) hypertension: Secondary | ICD-10-CM | POA: Diagnosis not present

## 2015-11-04 DIAGNOSIS — M545 Low back pain: Secondary | ICD-10-CM | POA: Diagnosis not present

## 2015-11-04 DIAGNOSIS — Z7952 Long term (current) use of systemic steroids: Secondary | ICD-10-CM | POA: Diagnosis not present

## 2015-11-04 DIAGNOSIS — Z7982 Long term (current) use of aspirin: Secondary | ICD-10-CM | POA: Diagnosis not present

## 2015-11-04 DIAGNOSIS — Z8781 Personal history of (healed) traumatic fracture: Secondary | ICD-10-CM | POA: Diagnosis not present

## 2015-11-05 DIAGNOSIS — Z7952 Long term (current) use of systemic steroids: Secondary | ICD-10-CM | POA: Diagnosis not present

## 2015-11-05 DIAGNOSIS — M545 Low back pain: Secondary | ICD-10-CM | POA: Diagnosis not present

## 2015-11-05 DIAGNOSIS — Z8781 Personal history of (healed) traumatic fracture: Secondary | ICD-10-CM | POA: Diagnosis not present

## 2015-11-05 DIAGNOSIS — I10 Essential (primary) hypertension: Secondary | ICD-10-CM | POA: Diagnosis not present

## 2015-11-05 DIAGNOSIS — Z7982 Long term (current) use of aspirin: Secondary | ICD-10-CM | POA: Diagnosis not present

## 2015-11-08 DIAGNOSIS — I10 Essential (primary) hypertension: Secondary | ICD-10-CM | POA: Diagnosis not present

## 2015-11-08 DIAGNOSIS — M545 Low back pain: Secondary | ICD-10-CM | POA: Diagnosis not present

## 2015-11-08 DIAGNOSIS — Z8781 Personal history of (healed) traumatic fracture: Secondary | ICD-10-CM | POA: Diagnosis not present

## 2015-11-08 DIAGNOSIS — Z7982 Long term (current) use of aspirin: Secondary | ICD-10-CM | POA: Diagnosis not present

## 2015-11-08 DIAGNOSIS — Z7952 Long term (current) use of systemic steroids: Secondary | ICD-10-CM | POA: Diagnosis not present

## 2015-11-09 DIAGNOSIS — Z7952 Long term (current) use of systemic steroids: Secondary | ICD-10-CM | POA: Diagnosis not present

## 2015-11-09 DIAGNOSIS — Z8781 Personal history of (healed) traumatic fracture: Secondary | ICD-10-CM | POA: Diagnosis not present

## 2015-11-09 DIAGNOSIS — Z7982 Long term (current) use of aspirin: Secondary | ICD-10-CM | POA: Diagnosis not present

## 2015-11-09 DIAGNOSIS — M545 Low back pain: Secondary | ICD-10-CM | POA: Diagnosis not present

## 2015-11-09 DIAGNOSIS — I10 Essential (primary) hypertension: Secondary | ICD-10-CM | POA: Diagnosis not present

## 2015-11-10 DIAGNOSIS — Z7952 Long term (current) use of systemic steroids: Secondary | ICD-10-CM | POA: Diagnosis not present

## 2015-11-10 DIAGNOSIS — M545 Low back pain: Secondary | ICD-10-CM | POA: Diagnosis not present

## 2015-11-10 DIAGNOSIS — Z8781 Personal history of (healed) traumatic fracture: Secondary | ICD-10-CM | POA: Diagnosis not present

## 2015-11-10 DIAGNOSIS — I10 Essential (primary) hypertension: Secondary | ICD-10-CM | POA: Diagnosis not present

## 2015-11-10 DIAGNOSIS — Z7982 Long term (current) use of aspirin: Secondary | ICD-10-CM | POA: Diagnosis not present

## 2015-11-11 DIAGNOSIS — I10 Essential (primary) hypertension: Secondary | ICD-10-CM | POA: Diagnosis not present

## 2015-11-11 DIAGNOSIS — Z8781 Personal history of (healed) traumatic fracture: Secondary | ICD-10-CM | POA: Diagnosis not present

## 2015-11-11 DIAGNOSIS — M545 Low back pain: Secondary | ICD-10-CM | POA: Diagnosis not present

## 2015-11-11 DIAGNOSIS — Z7952 Long term (current) use of systemic steroids: Secondary | ICD-10-CM | POA: Diagnosis not present

## 2015-11-11 DIAGNOSIS — Z7982 Long term (current) use of aspirin: Secondary | ICD-10-CM | POA: Diagnosis not present

## 2015-11-12 DIAGNOSIS — Z7952 Long term (current) use of systemic steroids: Secondary | ICD-10-CM | POA: Diagnosis not present

## 2015-11-12 DIAGNOSIS — I10 Essential (primary) hypertension: Secondary | ICD-10-CM | POA: Diagnosis not present

## 2015-11-12 DIAGNOSIS — M545 Low back pain: Secondary | ICD-10-CM | POA: Diagnosis not present

## 2015-11-12 DIAGNOSIS — Z8781 Personal history of (healed) traumatic fracture: Secondary | ICD-10-CM | POA: Diagnosis not present

## 2015-11-12 DIAGNOSIS — Z7982 Long term (current) use of aspirin: Secondary | ICD-10-CM | POA: Diagnosis not present

## 2015-11-15 DIAGNOSIS — I10 Essential (primary) hypertension: Secondary | ICD-10-CM | POA: Diagnosis not present

## 2015-11-15 DIAGNOSIS — Z8781 Personal history of (healed) traumatic fracture: Secondary | ICD-10-CM | POA: Diagnosis not present

## 2015-11-15 DIAGNOSIS — Z7982 Long term (current) use of aspirin: Secondary | ICD-10-CM | POA: Diagnosis not present

## 2015-11-15 DIAGNOSIS — Z7952 Long term (current) use of systemic steroids: Secondary | ICD-10-CM | POA: Diagnosis not present

## 2015-11-15 DIAGNOSIS — M545 Low back pain: Secondary | ICD-10-CM | POA: Diagnosis not present

## 2015-11-16 DIAGNOSIS — Z8781 Personal history of (healed) traumatic fracture: Secondary | ICD-10-CM | POA: Diagnosis not present

## 2015-11-16 DIAGNOSIS — I10 Essential (primary) hypertension: Secondary | ICD-10-CM | POA: Diagnosis not present

## 2015-11-16 DIAGNOSIS — M545 Low back pain: Secondary | ICD-10-CM | POA: Diagnosis not present

## 2015-11-16 DIAGNOSIS — Z7982 Long term (current) use of aspirin: Secondary | ICD-10-CM | POA: Diagnosis not present

## 2015-11-16 DIAGNOSIS — Z7952 Long term (current) use of systemic steroids: Secondary | ICD-10-CM | POA: Diagnosis not present

## 2015-11-17 DIAGNOSIS — M545 Low back pain: Secondary | ICD-10-CM | POA: Diagnosis not present

## 2015-11-17 DIAGNOSIS — Z7982 Long term (current) use of aspirin: Secondary | ICD-10-CM | POA: Diagnosis not present

## 2015-11-17 DIAGNOSIS — Z8781 Personal history of (healed) traumatic fracture: Secondary | ICD-10-CM | POA: Diagnosis not present

## 2015-11-17 DIAGNOSIS — I10 Essential (primary) hypertension: Secondary | ICD-10-CM | POA: Diagnosis not present

## 2015-11-17 DIAGNOSIS — Z7952 Long term (current) use of systemic steroids: Secondary | ICD-10-CM | POA: Diagnosis not present

## 2015-11-19 DIAGNOSIS — I10 Essential (primary) hypertension: Secondary | ICD-10-CM | POA: Diagnosis not present

## 2015-11-19 DIAGNOSIS — Z7982 Long term (current) use of aspirin: Secondary | ICD-10-CM | POA: Diagnosis not present

## 2015-11-19 DIAGNOSIS — M545 Low back pain: Secondary | ICD-10-CM | POA: Diagnosis not present

## 2015-11-19 DIAGNOSIS — Z8781 Personal history of (healed) traumatic fracture: Secondary | ICD-10-CM | POA: Diagnosis not present

## 2015-11-19 DIAGNOSIS — Z7952 Long term (current) use of systemic steroids: Secondary | ICD-10-CM | POA: Diagnosis not present

## 2015-11-22 DIAGNOSIS — Z7982 Long term (current) use of aspirin: Secondary | ICD-10-CM | POA: Diagnosis not present

## 2015-11-22 DIAGNOSIS — M4856XD Collapsed vertebra, not elsewhere classified, lumbar region, subsequent encounter for fracture with routine healing: Secondary | ICD-10-CM | POA: Diagnosis not present

## 2015-11-22 DIAGNOSIS — I10 Essential (primary) hypertension: Secondary | ICD-10-CM | POA: Diagnosis not present

## 2015-11-22 DIAGNOSIS — S329XXS Fracture of unspecified parts of lumbosacral spine and pelvis, sequela: Secondary | ICD-10-CM | POA: Diagnosis not present

## 2015-11-22 DIAGNOSIS — M545 Low back pain: Secondary | ICD-10-CM | POA: Diagnosis not present

## 2015-11-22 DIAGNOSIS — Z8781 Personal history of (healed) traumatic fracture: Secondary | ICD-10-CM | POA: Diagnosis not present

## 2015-11-22 DIAGNOSIS — Z7952 Long term (current) use of systemic steroids: Secondary | ICD-10-CM | POA: Diagnosis not present

## 2015-11-23 DIAGNOSIS — M545 Low back pain: Secondary | ICD-10-CM | POA: Diagnosis not present

## 2015-11-23 DIAGNOSIS — Z8781 Personal history of (healed) traumatic fracture: Secondary | ICD-10-CM | POA: Diagnosis not present

## 2015-11-23 DIAGNOSIS — Z7952 Long term (current) use of systemic steroids: Secondary | ICD-10-CM | POA: Diagnosis not present

## 2015-11-23 DIAGNOSIS — I10 Essential (primary) hypertension: Secondary | ICD-10-CM | POA: Diagnosis not present

## 2015-11-23 DIAGNOSIS — Z7982 Long term (current) use of aspirin: Secondary | ICD-10-CM | POA: Diagnosis not present

## 2015-11-24 DIAGNOSIS — Z8781 Personal history of (healed) traumatic fracture: Secondary | ICD-10-CM | POA: Diagnosis not present

## 2015-11-24 DIAGNOSIS — Z7952 Long term (current) use of systemic steroids: Secondary | ICD-10-CM | POA: Diagnosis not present

## 2015-11-24 DIAGNOSIS — M545 Low back pain: Secondary | ICD-10-CM | POA: Diagnosis not present

## 2015-11-24 DIAGNOSIS — Z7982 Long term (current) use of aspirin: Secondary | ICD-10-CM | POA: Diagnosis not present

## 2015-11-24 DIAGNOSIS — I10 Essential (primary) hypertension: Secondary | ICD-10-CM | POA: Diagnosis not present

## 2015-11-25 ENCOUNTER — Encounter (HOSPITAL_COMMUNITY): Payer: Self-pay | Admitting: Emergency Medicine

## 2015-11-25 ENCOUNTER — Emergency Department (HOSPITAL_COMMUNITY)
Admission: EM | Admit: 2015-11-25 | Discharge: 2015-11-25 | Disposition: A | Discharge status: Home / Self Care | Payer: Medicare Other | Attending: Physician Assistant | Admitting: Physician Assistant

## 2015-11-25 ENCOUNTER — Emergency Department (HOSPITAL_COMMUNITY): Payer: Medicare Other

## 2015-11-25 DIAGNOSIS — Z79899 Other long term (current) drug therapy: Secondary | ICD-10-CM | POA: Insufficient documentation

## 2015-11-25 DIAGNOSIS — R410 Disorientation, unspecified: Secondary | ICD-10-CM | POA: Diagnosis not present

## 2015-11-25 DIAGNOSIS — R278 Other lack of coordination: Secondary | ICD-10-CM | POA: Insufficient documentation

## 2015-11-25 DIAGNOSIS — Z8781 Personal history of (healed) traumatic fracture: Secondary | ICD-10-CM | POA: Diagnosis not present

## 2015-11-25 DIAGNOSIS — M545 Low back pain: Secondary | ICD-10-CM | POA: Diagnosis not present

## 2015-11-25 DIAGNOSIS — Z7982 Long term (current) use of aspirin: Secondary | ICD-10-CM | POA: Insufficient documentation

## 2015-11-25 DIAGNOSIS — R262 Difficulty in walking, not elsewhere classified: Secondary | ICD-10-CM | POA: Insufficient documentation

## 2015-11-25 DIAGNOSIS — R4182 Altered mental status, unspecified: Secondary | ICD-10-CM | POA: Diagnosis not present

## 2015-11-25 DIAGNOSIS — Z7952 Long term (current) use of systemic steroids: Secondary | ICD-10-CM | POA: Diagnosis not present

## 2015-11-25 DIAGNOSIS — I10 Essential (primary) hypertension: Secondary | ICD-10-CM | POA: Insufficient documentation

## 2015-11-25 HISTORY — DX: Fracture of unspecified parts of lumbosacral spine and pelvis, initial encounter for closed fracture: S32.9XXA

## 2015-11-25 LAB — URINE MICROSCOPIC-ADD ON

## 2015-11-25 LAB — URINALYSIS, ROUTINE W REFLEX MICROSCOPIC
GLUCOSE, UA: NEGATIVE mg/dL
HGB URINE DIPSTICK: NEGATIVE
KETONES UR: NEGATIVE mg/dL
Leukocytes, UA: NEGATIVE
Nitrite: NEGATIVE
Protein, ur: 100 mg/dL — AB
SPECIFIC GRAVITY, URINE: 1.036 — AB (ref 1.005–1.030)
pH: 6 (ref 5.0–8.0)

## 2015-11-25 LAB — BASIC METABOLIC PANEL
ANION GAP: 10 (ref 5–15)
BUN: 25 mg/dL — ABNORMAL HIGH (ref 6–20)
CALCIUM: 8.9 mg/dL (ref 8.9–10.3)
CHLORIDE: 102 mmol/L (ref 101–111)
CO2: 22 mmol/L (ref 22–32)
Creatinine, Ser: 0.6 mg/dL (ref 0.44–1.00)
GFR calc non Af Amer: >60 mL/min (ref >60)
GLUCOSE: 112 mg/dL — AB (ref 65–99)
POTASSIUM: 4.2 mmol/L (ref 3.5–5.1)
Sodium: 134 mmol/L — ABNORMAL LOW (ref 135–145)

## 2015-11-25 LAB — CBG MONITORING, ED: Glucose-Capillary: 113 mg/dL — ABNORMAL HIGH (ref 65–99)

## 2015-11-25 LAB — CBC
HEMATOCRIT: 30.7 % — AB (ref 36.0–46.0)
HEMOGLOBIN: 10.4 g/dL — AB (ref 12.0–15.0)
MCH: 31.5 pg (ref 26.0–34.0)
MCHC: 33.9 g/dL (ref 30.0–36.0)
MCV: 93 fL (ref 78.0–100.0)
Platelets: 276 10*3/uL (ref 150–400)
RBC: 3.3 MIL/uL — AB (ref 3.87–5.11)
RDW: 13.7 % (ref 11.5–15.5)
WBC: 5.3 10*3/uL (ref 4.0–10.5)

## 2015-11-25 LAB — AMMONIA: AMMONIA: 33 umol/L (ref 9–35)

## 2015-11-25 LAB — PROTIME-INR
INR: 1.04 (ref 0.00–1.49)
PROTHROMBIN TIME: 13.4 s (ref 11.6–15.2)

## 2015-11-25 MED ORDER — METOPROLOL TARTRATE 25 MG PO TABS
25.0000 mg | ORAL_TABLET | Freq: Once | ORAL | Status: AC
  Administered 2015-11-25: 25 mg via ORAL
  Filled 2015-11-25: qty 1

## 2015-11-25 NOTE — ED Notes (Signed)
Patient transported to MRI 

## 2015-11-25 NOTE — ED Provider Notes (Signed)
CSN: [id number]     Arrival date & time 11/25/15  1040 History   First MD Initiated Contact with Patient 11/25/15 1237     Chief Complaint  Patient presents with  . Altered Mental Status     (Consider location/radiation/quality/duration/timing/severity/associated sxs/prior Treatment) HPI   Patient is a 82 year old female presenting with altered mental status. Patient had recent pelvic fracture a month ago. Had been doing much better in physical therapy. Then over the last week and a half has been declining functional status. This become more unsteady on her feet. Has been increasingly confused. Patient seen by PCP and was taken off narcotics. Patient's been off narcotics for 1 week. Patient's family notes that she still unsteady, confused.  It sounds as if patient has become increasingly confused over the last 2 months. But the unsteadiness has been a change the last week and half.  Level V caveat altered mental status.  Past Medical History  Diagnosis Date  . Hypertension   . Pelvis fracture (HCC)    History reviewed. No pertinent past surgical history. History reviewed. No pertinent family history. Social History  Substance Use Topics  . Smoking status: Never Smoker   . Smokeless tobacco: None  . Alcohol Use: Yes     Comment: ocasional   OB History    No data available     Review of Systems    Allergies  Review of patient's allergies indicates no known allergies.  Home Medications   Prior to Admission medications   Medication Sig Start Date End Date Taking? Authorizing Provider  acetaminophen (TYLENOL) 500 MG tablet Take 1,000 mg by mouth every 8 (eight) hours as needed for moderate pain.   Yes Historical Provider, MD  aspirin 81 MG chewable tablet Chew 81 mg by mouth daily.   Yes Historical Provider, MD  bisacodyl (DULCOLAX) 5 MG EC tablet Take 2 tablets (10 mg total) by mouth daily as needed for moderate constipation. 10/21/15  Yes Starleen Armsawood S Elgergawy, MD   calcitonin, salmon, (MIACALCIN/FORTICAL) 200 UNIT/ACT nasal spray Place 1 spray into alternate nostrils daily.   Yes Historical Provider, MD  calcium carbonate (OSCAL) 1500 (600 Ca) MG TABS tablet Take 1,500 mg by mouth daily.   Yes Historical Provider, MD  Cyanocobalamin (VITAMIN B 12 PO) Take 1 tablet by mouth daily.   Yes Historical Provider, MD  Docusate Sodium (COLACE PO) Take 1 tablet by mouth 2 (two) times daily.   Yes Historical Provider, MD  DULoxetine (CYMBALTA) 30 MG capsule Take 30 mg by mouth 2 (two) times daily.   Yes Historical Provider, MD  metoprolol succinate (TOPROL-XL) 25 MG 24 hr tablet Take 25 mg by mouth 3 (three) times daily.    Yes Historical Provider, MD  oxyCODONE (OXY IR/ROXICODONE) 5 MG immediate release tablet Take 1 tablet (5 mg total) by mouth every 4 (four) hours as needed for moderate pain. Patient not taking: Reported on 11/25/2015 10/21/15   Leana Roeawood S Elgergawy, MD   BP 192/85 mmHg  Pulse 72  Temp(Src) 98 F (36.7 C) (Oral)  Resp 16  SpO2 99% Physical Exam  Constitutional: She appears well-developed and well-nourished.  HENT:  Head: Normocephalic and atraumatic.  Eyes: Conjunctivae are normal. Right eye exhibits no discharge.  Neck: Neck supple.  Cardiovascular: Normal rate, regular rhythm and normal heart sounds.   No murmur heard. Pulmonary/Chest: Effort normal and breath sounds normal. She has no wheezes. She has no rales.  Abdominal: Soft. She exhibits no distension. There is no tenderness.  Musculoskeletal: Normal range of motion. She exhibits no edema.  Neurological: No cranial nerve deficit.  Oriented to self. Some mild difficulty following commands. Left finger to nose mild dysmetria.  Skin: Skin is warm and dry. No rash noted. She is not diaphoretic.  Psychiatric: Her behavior is normal.  Nursing note and vitals reviewed.   ED Course  Procedures (including critical care time) Labs Review Labs Reviewed  BASIC METABOLIC PANEL - Abnormal;  Notable for the following:    Sodium 134 (*)    Glucose, Bld 112 (*)    BUN 25 (*)    All other components within normal limits  CBC - Abnormal; Notable for the following:    RBC 3.30 (*)    Hemoglobin 10.4 (*)    HCT 30.7 (*)    All other components within normal limits  URINALYSIS, ROUTINE W REFLEX MICROSCOPIC (NOT AT [facility]) - Abnormal; Notable for the following:    Specific Gravity, Urine 1.036 (*)    Bilirubin Urine SMALL (*)    Protein, ur 100 (*)    All other components within normal limits  URINE MICROSCOPIC-ADD ON - Abnormal; Notable for the following:    Squamous Epithelial / LPF 0-5 (*)    Bacteria, UA RARE (*)    All other components within normal limits  CBG MONITORING, ED - Abnormal; Notable for the following:    Glucose-Capillary 113 (*)    All other components within normal limits  URINE CULTURE  AMMONIA  PROTIME-INR  CBG MONITORING, ED    Imaging Review Dg Chest 2 View  11/25/2015  CLINICAL DATA:  Worsening mental status changes. Recent hip fracture. EXAM: CHEST  2 VIEW COMPARISON:  None. FINDINGS: The cardiac silhouette, mediastinal and hilar contours are within normal limits for age. There is tortuosity, ectasia and calcification of the thoracic aorta. The lungs are clear. No pleural effusion. The bony thorax is intact. IMPRESSION: No acute cardiopulmonary findings. Electronically Signed   By: Rudie Meyer M.D.   On: 11/25/2015 13:39   Ct Head Wo Contrast  11/25/2015  CLINICAL DATA:  Confusion EXAM: CT HEAD WITHOUT CONTRAST TECHNIQUE: Contiguous axial images were obtained from the base of the skull through the vertex without intravenous contrast. COMPARISON:  None. FINDINGS: Global atrophy. Chronic ischemic changes in the periventricular white matter are prominent. No mass effect, midline shift, or acute hemorrhage. There is fluid layering in the right mastoid air cells. Left mastoid air cells are clear. Visualized paranasal sinuses are clear. Intact cranium.  IMPRESSION: Global atrophy and chronic ischemic changes There is fluid in the right mastoid air cells. Inflammatory process is not excluded. Electronically Signed   By: Jolaine Click M.D.   On: 11/25/2015 13:46   I have personally reviewed and evaluated these images and lab results as part of my medical decision-making.   EKG Interpretation None      MDM   Final diagnoses:  None    Patient is a 82 year old female with history of pelvic fracture and recent unsteadiness. Patient's daughter brought her here because she is concerned about a stroke. Patient's daughter reports that her unsteadiness has gotten worse over the last week and half. She has had increasing confusion over the last course of 2 months.  We will get initial labs, chest x-ray, UA to rule out infection as a cause of altered mental status.  We will get MRI to make sure patient does not posterior have circulation ischemia.  If all this is negative we'll have her  discharged home to follow up with primary care physician. We'll maximize patient's home health needs.    4:00 PM ALll labs and CT normal. MRI pending.  Pt does not meet inpatient requirements for admission. No focal weakness or pain.     Eulonda Andalon Randall An, MD 11/25/15 1600

## 2015-11-25 NOTE — Progress Notes (Addendum)
Entered in d/c instructions  [facility] Call As needed --Services for home health social worker and a home health aide have been ordered and [facility] to [facility] You are also welcome to call to check on the status of services as needed They will call you also [address] [place] [id number] [phone]

## 2015-11-25 NOTE — ED Notes (Addendum)
Verbalized understanding discharge instructions. In no acute distress.   This Clinical research associatewriter offered to help take Pt out and help her into the car.  Family refused.

## 2015-11-25 NOTE — ED Notes (Signed)
Per Pt's family, Pt had recent pelvic fracture from unknown cause.  Daughter-in-law reports "she was doing really well and now, she can't keep her balance."  Pt goes to physical therapy x 3 times per week and home OT x 2 times per week.  Sts Home Health has "run out."  Pt denying pain or complaints.

## 2015-11-25 NOTE — Discharge Instructions (Signed)
Fall Prevention in the Home  Falls can cause injuries and can affect people from all age groups. There are many simple things that you can do to make your home safe and to help prevent falls. WHAT CAN I DO ON THE OUTSIDE OF MY HOME?  Regularly repair the edges of walkways and driveways and fix any cracks.  Remove high doorway thresholds.  Trim any shrubbery on the main path into your home.  Use bright outdoor lighting.  Clear walkways of debris and clutter, including tools and rocks.  Regularly check that handrails are securely fastened and in good repair. Both sides of any steps should have handrails.  Install guardrails along the edges of any raised decks or porches.  Have leaves, snow, and ice cleared regularly.  Use sand or salt on walkways during winter months.  In the garage, clean up any spills right away, including grease or oil spills. WHAT CAN I DO IN THE BATHROOM?  Use night lights.  Install grab bars by the toilet and in the tub and shower. Do not use towel bars as grab bars.  Use non-skid mats or decals on the floor of the tub or shower.  If you need to sit down while you are in the shower, use a plastic, non-slip stool..  Keep the floor dry. Immediately clean up any water that spills on the floor.  Remove soap buildup in the tub or shower on a regular basis.  Attach bath mats securely with double-sided non-slip rug tape.  Remove throw rugs and other tripping hazards from the floor. WHAT CAN I DO IN THE BEDROOM?  Use night lights.  Make sure that a bedside light is easy to reach.  Do not use oversized bedding that drapes onto the floor.  Have a firm chair that has side arms to use for getting dressed.  Remove throw rugs and other tripping hazards from the floor. WHAT CAN I DO IN THE KITCHEN?   Clean up any spills right away.  Avoid walking on wet floors.  Place frequently used items in easy-to-reach places.  If you need to reach for something  above you, use a sturdy step stool that has a grab bar.  Keep electrical cables out of the way.  Do not use floor polish or wax that makes floors slippery. If you have to use wax, make sure that it is non-skid floor wax.  Remove throw rugs and other tripping hazards from the floor. WHAT CAN I DO IN THE STAIRWAYS?  Do not leave any items on the stairs.  Make sure that there are handrails on both sides of the stairs. Fix handrails that are broken or loose. Make sure that handrails are as long as the stairways.  Check any carpeting to make sure that it is firmly attached to the stairs. Fix any carpet that is loose or worn.  Avoid having throw rugs at the top or bottom of stairways, or secure the rugs with carpet tape to prevent them from moving.  Make sure that you have a light switch at the top of the stairs and the bottom of the stairs. If you do not have them, have them installed. WHAT ARE SOME OTHER FALL PREVENTION TIPS?  Wear closed-toe shoes that fit well and support your feet. Wear shoes that have rubber soles or low heels.  When you use a stepladder, make sure that it is completely opened and that the sides are firmly locked. Have someone hold the ladder while you   are using it. Do not climb a closed stepladder.  Add color or contrast paint or tape to grab bars and handrails in your home. Place contrasting color strips on the first and last steps.  Use mobility aids as needed, such as canes, walkers, scooters, and crutches.  Turn on lights if it is dark. Replace any light bulbs that burn out.  Set up furniture so that there are clear paths. Keep the furniture in the same spot.  Fix any uneven floor surfaces.  Choose a carpet design that does not hide the edge of steps of a stairway.  Be aware of any and all pets.  Review your medicines with your healthcare provider. Some medicines can cause dizziness or changes in blood pressure, which increase your risk of falling. Talk  with your health care provider about other ways that you can decrease your risk of falls. This may include working with a physical therapist or trainer to improve your strength, balance, and endurance.   This information is not intended to replace advice given to you by your health care provider. Make sure you discuss any questions you have with your health care provider.   Document Released: 08/11/2002 Document Revised: 01/05/2015 Document Reviewed: 09/25/2014 Elsevier Interactive Patient Education 2016 [employer]  

## 2015-11-25 NOTE — ED Notes (Signed)
Patient transported to CT and X ray 

## 2015-11-25 NOTE — ED Notes (Signed)
UNABLE TO COLLECT LABS PATIENT IS NOT IN ROOM. 

## 2015-11-25 NOTE — ED Notes (Signed)
Upon d/c, Pt's daughter-in-law extremely upset that the Pt is not being admitted.  Sts "no doctor even assessed her.  She can't walk and she's only getting worse."  This Clinical research associatewriter went over home health resources again.  Daughter-in-law asked about [facility] having Neurology and it was explained that [facility] and they would come over if needed.  Daughter-in-law sts "we are never coming back here.  It's worthless.  [facility]  This writer feels that nothing I could say would make her feel better.  Pt and Pt's son seemed pleased w/ visit and understanding.

## 2015-11-25 NOTE — ED Notes (Signed)
PCP noticed confusion Monday during treatment for hip fracture. Was taken off her narcotics to see if she would improve. Pt continued to get worse throughout the week. Was not holding fork correctly last night. Physical therapy noticed her BP elevated today and told them to come here. Patient alert and oriented x 4 in triage. No focal neuro deficits observed in triage, able to follow commands.

## 2015-11-25 NOTE — Progress Notes (Signed)
ED CM noted CM consult for maximizing pt present home health services CM spoke with Elnita [facility]yl at [facility] to add HHSW & aide at pt independent facility EDP updated & Order in EPIC - Adding HHSW and aide in addition to the already active HHRN/PT/OT pt is receiving [facility] Faxing new orders to [place] at [facility] [phone]

## 2015-11-26 DIAGNOSIS — Z8781 Personal history of (healed) traumatic fracture: Secondary | ICD-10-CM | POA: Diagnosis not present

## 2015-11-26 DIAGNOSIS — I10 Essential (primary) hypertension: Secondary | ICD-10-CM | POA: Diagnosis not present

## 2015-11-26 DIAGNOSIS — Z7982 Long term (current) use of aspirin: Secondary | ICD-10-CM | POA: Diagnosis not present

## 2015-11-26 DIAGNOSIS — Z7952 Long term (current) use of systemic steroids: Secondary | ICD-10-CM | POA: Diagnosis not present

## 2015-11-26 DIAGNOSIS — M545 Low back pain: Secondary | ICD-10-CM | POA: Diagnosis not present

## 2015-11-26 LAB — URINE CULTURE

## 2015-11-29 DIAGNOSIS — M545 Low back pain: Secondary | ICD-10-CM | POA: Diagnosis not present

## 2015-11-29 DIAGNOSIS — Z7952 Long term (current) use of systemic steroids: Secondary | ICD-10-CM | POA: Diagnosis not present

## 2015-11-29 DIAGNOSIS — Z7982 Long term (current) use of aspirin: Secondary | ICD-10-CM | POA: Diagnosis not present

## 2015-11-29 DIAGNOSIS — I10 Essential (primary) hypertension: Secondary | ICD-10-CM | POA: Diagnosis not present

## 2015-11-29 DIAGNOSIS — Z8781 Personal history of (healed) traumatic fracture: Secondary | ICD-10-CM | POA: Diagnosis not present

## 2015-11-30 DIAGNOSIS — I10 Essential (primary) hypertension: Secondary | ICD-10-CM | POA: Diagnosis not present

## 2015-11-30 DIAGNOSIS — Z7982 Long term (current) use of aspirin: Secondary | ICD-10-CM | POA: Diagnosis not present

## 2015-11-30 DIAGNOSIS — Z8781 Personal history of (healed) traumatic fracture: Secondary | ICD-10-CM | POA: Diagnosis not present

## 2015-11-30 DIAGNOSIS — Z7952 Long term (current) use of systemic steroids: Secondary | ICD-10-CM | POA: Diagnosis not present

## 2015-11-30 DIAGNOSIS — M545 Low back pain: Secondary | ICD-10-CM | POA: Diagnosis not present

## 2015-12-02 DIAGNOSIS — Z7952 Long term (current) use of systemic steroids: Secondary | ICD-10-CM | POA: Diagnosis not present

## 2015-12-02 DIAGNOSIS — Z8781 Personal history of (healed) traumatic fracture: Secondary | ICD-10-CM | POA: Diagnosis not present

## 2015-12-02 DIAGNOSIS — M545 Low back pain: Secondary | ICD-10-CM | POA: Diagnosis not present

## 2015-12-02 DIAGNOSIS — Z7982 Long term (current) use of aspirin: Secondary | ICD-10-CM | POA: Diagnosis not present

## 2015-12-02 DIAGNOSIS — I10 Essential (primary) hypertension: Secondary | ICD-10-CM | POA: Diagnosis not present

## 2015-12-03 DIAGNOSIS — Z7952 Long term (current) use of systemic steroids: Secondary | ICD-10-CM | POA: Diagnosis not present

## 2015-12-03 DIAGNOSIS — M545 Low back pain: Secondary | ICD-10-CM | POA: Diagnosis not present

## 2015-12-03 DIAGNOSIS — Z8781 Personal history of (healed) traumatic fracture: Secondary | ICD-10-CM | POA: Diagnosis not present

## 2015-12-03 DIAGNOSIS — Z7982 Long term (current) use of aspirin: Secondary | ICD-10-CM | POA: Diagnosis not present

## 2015-12-03 DIAGNOSIS — I10 Essential (primary) hypertension: Secondary | ICD-10-CM | POA: Diagnosis not present

## 2015-12-06 DIAGNOSIS — Z8781 Personal history of (healed) traumatic fracture: Secondary | ICD-10-CM | POA: Diagnosis not present

## 2015-12-06 DIAGNOSIS — Z7952 Long term (current) use of systemic steroids: Secondary | ICD-10-CM | POA: Diagnosis not present

## 2015-12-06 DIAGNOSIS — I10 Essential (primary) hypertension: Secondary | ICD-10-CM | POA: Diagnosis not present

## 2015-12-06 DIAGNOSIS — Z7982 Long term (current) use of aspirin: Secondary | ICD-10-CM | POA: Diagnosis not present

## 2015-12-06 DIAGNOSIS — M545 Low back pain: Secondary | ICD-10-CM | POA: Diagnosis not present

## 2015-12-07 DIAGNOSIS — G629 Polyneuropathy, unspecified: Secondary | ICD-10-CM | POA: Diagnosis not present

## 2015-12-07 DIAGNOSIS — Z7982 Long term (current) use of aspirin: Secondary | ICD-10-CM | POA: Diagnosis not present

## 2015-12-07 DIAGNOSIS — R2681 Unsteadiness on feet: Secondary | ICD-10-CM | POA: Diagnosis not present

## 2015-12-07 DIAGNOSIS — R413 Other amnesia: Secondary | ICD-10-CM | POA: Diagnosis not present

## 2015-12-07 DIAGNOSIS — Z8781 Personal history of (healed) traumatic fracture: Secondary | ICD-10-CM | POA: Diagnosis not present

## 2015-12-07 DIAGNOSIS — Z7952 Long term (current) use of systemic steroids: Secondary | ICD-10-CM | POA: Diagnosis not present

## 2015-12-07 DIAGNOSIS — G239 Degenerative disease of basal ganglia, unspecified: Secondary | ICD-10-CM | POA: Diagnosis not present

## 2015-12-07 DIAGNOSIS — I10 Essential (primary) hypertension: Secondary | ICD-10-CM | POA: Diagnosis not present

## 2015-12-07 DIAGNOSIS — M545 Low back pain: Secondary | ICD-10-CM | POA: Diagnosis not present

## 2015-12-08 DIAGNOSIS — Z7982 Long term (current) use of aspirin: Secondary | ICD-10-CM | POA: Diagnosis not present

## 2015-12-08 DIAGNOSIS — Z7952 Long term (current) use of systemic steroids: Secondary | ICD-10-CM | POA: Diagnosis not present

## 2015-12-08 DIAGNOSIS — R2681 Unsteadiness on feet: Secondary | ICD-10-CM | POA: Diagnosis not present

## 2015-12-08 DIAGNOSIS — M545 Low back pain: Secondary | ICD-10-CM | POA: Diagnosis not present

## 2015-12-08 DIAGNOSIS — R413 Other amnesia: Secondary | ICD-10-CM | POA: Diagnosis not present

## 2015-12-08 DIAGNOSIS — G629 Polyneuropathy, unspecified: Secondary | ICD-10-CM | POA: Diagnosis not present

## 2015-12-08 DIAGNOSIS — Z8781 Personal history of (healed) traumatic fracture: Secondary | ICD-10-CM | POA: Diagnosis not present

## 2015-12-08 DIAGNOSIS — I10 Essential (primary) hypertension: Secondary | ICD-10-CM | POA: Diagnosis not present

## 2015-12-09 DIAGNOSIS — Z8781 Personal history of (healed) traumatic fracture: Secondary | ICD-10-CM | POA: Diagnosis not present

## 2015-12-09 DIAGNOSIS — M545 Low back pain: Secondary | ICD-10-CM | POA: Diagnosis not present

## 2015-12-09 DIAGNOSIS — Z7952 Long term (current) use of systemic steroids: Secondary | ICD-10-CM | POA: Diagnosis not present

## 2015-12-09 DIAGNOSIS — I10 Essential (primary) hypertension: Secondary | ICD-10-CM | POA: Diagnosis not present

## 2015-12-09 DIAGNOSIS — Z7982 Long term (current) use of aspirin: Secondary | ICD-10-CM | POA: Diagnosis not present

## 2015-12-10 DIAGNOSIS — Z7982 Long term (current) use of aspirin: Secondary | ICD-10-CM | POA: Diagnosis not present

## 2015-12-10 DIAGNOSIS — M545 Low back pain: Secondary | ICD-10-CM | POA: Diagnosis not present

## 2015-12-10 DIAGNOSIS — M25552 Pain in left hip: Secondary | ICD-10-CM | POA: Diagnosis not present

## 2015-12-10 DIAGNOSIS — I10 Essential (primary) hypertension: Secondary | ICD-10-CM | POA: Diagnosis not present

## 2015-12-10 DIAGNOSIS — Z8781 Personal history of (healed) traumatic fracture: Secondary | ICD-10-CM | POA: Diagnosis not present

## 2015-12-10 DIAGNOSIS — Z7952 Long term (current) use of systemic steroids: Secondary | ICD-10-CM | POA: Diagnosis not present

## 2015-12-13 DIAGNOSIS — Z7952 Long term (current) use of systemic steroids: Secondary | ICD-10-CM | POA: Diagnosis not present

## 2015-12-13 DIAGNOSIS — M545 Low back pain: Secondary | ICD-10-CM | POA: Diagnosis not present

## 2015-12-13 DIAGNOSIS — Z7982 Long term (current) use of aspirin: Secondary | ICD-10-CM | POA: Diagnosis not present

## 2015-12-13 DIAGNOSIS — I10 Essential (primary) hypertension: Secondary | ICD-10-CM | POA: Diagnosis not present

## 2015-12-13 DIAGNOSIS — Z8781 Personal history of (healed) traumatic fracture: Secondary | ICD-10-CM | POA: Diagnosis not present

## 2015-12-14 DIAGNOSIS — S32010D Wedge compression fracture of first lumbar vertebra, subsequent encounter for fracture with routine healing: Secondary | ICD-10-CM | POA: Diagnosis not present

## 2015-12-14 DIAGNOSIS — M5030 Other cervical disc degeneration, unspecified cervical region: Secondary | ICD-10-CM | POA: Diagnosis not present

## 2015-12-14 DIAGNOSIS — I10 Essential (primary) hypertension: Secondary | ICD-10-CM | POA: Diagnosis not present

## 2015-12-14 DIAGNOSIS — M4316 Spondylolisthesis, lumbar region: Secondary | ICD-10-CM | POA: Diagnosis not present

## 2015-12-14 DIAGNOSIS — Z7982 Long term (current) use of aspirin: Secondary | ICD-10-CM | POA: Diagnosis not present

## 2015-12-14 DIAGNOSIS — Z87440 Personal history of urinary (tract) infections: Secondary | ICD-10-CM | POA: Diagnosis not present

## 2015-12-14 DIAGNOSIS — Z7952 Long term (current) use of systemic steroids: Secondary | ICD-10-CM | POA: Diagnosis not present

## 2015-12-14 DIAGNOSIS — M545 Low back pain: Secondary | ICD-10-CM | POA: Diagnosis not present

## 2015-12-14 DIAGNOSIS — Z8781 Personal history of (healed) traumatic fracture: Secondary | ICD-10-CM | POA: Diagnosis not present

## 2015-12-23 DIAGNOSIS — M4316 Spondylolisthesis, lumbar region: Secondary | ICD-10-CM | POA: Diagnosis not present

## 2015-12-23 DIAGNOSIS — M545 Low back pain: Secondary | ICD-10-CM | POA: Diagnosis not present

## 2015-12-23 DIAGNOSIS — Z7952 Long term (current) use of systemic steroids: Secondary | ICD-10-CM | POA: Diagnosis not present

## 2015-12-23 DIAGNOSIS — I10 Essential (primary) hypertension: Secondary | ICD-10-CM | POA: Diagnosis not present

## 2015-12-23 DIAGNOSIS — S32010D Wedge compression fracture of first lumbar vertebra, subsequent encounter for fracture with routine healing: Secondary | ICD-10-CM | POA: Diagnosis not present

## 2015-12-23 DIAGNOSIS — M5030 Other cervical disc degeneration, unspecified cervical region: Secondary | ICD-10-CM | POA: Diagnosis not present

## 2015-12-24 DIAGNOSIS — S32010D Wedge compression fracture of first lumbar vertebra, subsequent encounter for fracture with routine healing: Secondary | ICD-10-CM | POA: Diagnosis not present

## 2015-12-24 DIAGNOSIS — Z7952 Long term (current) use of systemic steroids: Secondary | ICD-10-CM | POA: Diagnosis not present

## 2015-12-24 DIAGNOSIS — M4316 Spondylolisthesis, lumbar region: Secondary | ICD-10-CM | POA: Diagnosis not present

## 2015-12-24 DIAGNOSIS — I10 Essential (primary) hypertension: Secondary | ICD-10-CM | POA: Diagnosis not present

## 2015-12-24 DIAGNOSIS — M5030 Other cervical disc degeneration, unspecified cervical region: Secondary | ICD-10-CM | POA: Diagnosis not present

## 2015-12-24 DIAGNOSIS — M545 Low back pain: Secondary | ICD-10-CM | POA: Diagnosis not present

## 2015-12-27 DIAGNOSIS — I10 Essential (primary) hypertension: Secondary | ICD-10-CM | POA: Diagnosis not present

## 2015-12-27 DIAGNOSIS — S32010D Wedge compression fracture of first lumbar vertebra, subsequent encounter for fracture with routine healing: Secondary | ICD-10-CM | POA: Diagnosis not present

## 2015-12-27 DIAGNOSIS — Z7952 Long term (current) use of systemic steroids: Secondary | ICD-10-CM | POA: Diagnosis not present

## 2015-12-27 DIAGNOSIS — M5030 Other cervical disc degeneration, unspecified cervical region: Secondary | ICD-10-CM | POA: Diagnosis not present

## 2015-12-27 DIAGNOSIS — M545 Low back pain: Secondary | ICD-10-CM | POA: Diagnosis not present

## 2015-12-27 DIAGNOSIS — M4316 Spondylolisthesis, lumbar region: Secondary | ICD-10-CM | POA: Diagnosis not present

## 2015-12-28 DIAGNOSIS — M4316 Spondylolisthesis, lumbar region: Secondary | ICD-10-CM | POA: Diagnosis not present

## 2015-12-28 DIAGNOSIS — M545 Low back pain: Secondary | ICD-10-CM | POA: Diagnosis not present

## 2015-12-28 DIAGNOSIS — Z7952 Long term (current) use of systemic steroids: Secondary | ICD-10-CM | POA: Diagnosis not present

## 2015-12-28 DIAGNOSIS — S32010D Wedge compression fracture of first lumbar vertebra, subsequent encounter for fracture with routine healing: Secondary | ICD-10-CM | POA: Diagnosis not present

## 2015-12-28 DIAGNOSIS — I10 Essential (primary) hypertension: Secondary | ICD-10-CM | POA: Diagnosis not present

## 2015-12-28 DIAGNOSIS — M5030 Other cervical disc degeneration, unspecified cervical region: Secondary | ICD-10-CM | POA: Diagnosis not present

## 2015-12-29 DIAGNOSIS — D649 Anemia, unspecified: Secondary | ICD-10-CM | POA: Diagnosis not present

## 2015-12-29 DIAGNOSIS — I10 Essential (primary) hypertension: Secondary | ICD-10-CM | POA: Diagnosis not present

## 2015-12-29 DIAGNOSIS — Z111 Encounter for screening for respiratory tuberculosis: Secondary | ICD-10-CM | POA: Diagnosis not present

## 2015-12-29 DIAGNOSIS — N390 Urinary tract infection, site not specified: Secondary | ICD-10-CM | POA: Diagnosis not present

## 2015-12-30 DIAGNOSIS — M4316 Spondylolisthesis, lumbar region: Secondary | ICD-10-CM | POA: Diagnosis not present

## 2015-12-30 DIAGNOSIS — M545 Low back pain: Secondary | ICD-10-CM | POA: Diagnosis not present

## 2015-12-30 DIAGNOSIS — Z7952 Long term (current) use of systemic steroids: Secondary | ICD-10-CM | POA: Diagnosis not present

## 2015-12-30 DIAGNOSIS — S32010D Wedge compression fracture of first lumbar vertebra, subsequent encounter for fracture with routine healing: Secondary | ICD-10-CM | POA: Diagnosis not present

## 2015-12-30 DIAGNOSIS — I10 Essential (primary) hypertension: Secondary | ICD-10-CM | POA: Diagnosis not present

## 2015-12-30 DIAGNOSIS — M5030 Other cervical disc degeneration, unspecified cervical region: Secondary | ICD-10-CM | POA: Diagnosis not present

## 2015-12-31 DIAGNOSIS — M5030 Other cervical disc degeneration, unspecified cervical region: Secondary | ICD-10-CM | POA: Diagnosis not present

## 2015-12-31 DIAGNOSIS — M4316 Spondylolisthesis, lumbar region: Secondary | ICD-10-CM | POA: Diagnosis not present

## 2015-12-31 DIAGNOSIS — I10 Essential (primary) hypertension: Secondary | ICD-10-CM | POA: Diagnosis not present

## 2015-12-31 DIAGNOSIS — M545 Low back pain: Secondary | ICD-10-CM | POA: Diagnosis not present

## 2015-12-31 DIAGNOSIS — S32010D Wedge compression fracture of first lumbar vertebra, subsequent encounter for fracture with routine healing: Secondary | ICD-10-CM | POA: Diagnosis not present

## 2015-12-31 DIAGNOSIS — Z7952 Long term (current) use of systemic steroids: Secondary | ICD-10-CM | POA: Diagnosis not present

## 2016-01-03 DIAGNOSIS — I10 Essential (primary) hypertension: Secondary | ICD-10-CM | POA: Diagnosis not present

## 2016-01-03 DIAGNOSIS — M5030 Other cervical disc degeneration, unspecified cervical region: Secondary | ICD-10-CM | POA: Diagnosis not present

## 2016-01-03 DIAGNOSIS — M4316 Spondylolisthesis, lumbar region: Secondary | ICD-10-CM | POA: Diagnosis not present

## 2016-01-03 DIAGNOSIS — S32010D Wedge compression fracture of first lumbar vertebra, subsequent encounter for fracture with routine healing: Secondary | ICD-10-CM | POA: Diagnosis not present

## 2016-01-03 DIAGNOSIS — M545 Low back pain: Secondary | ICD-10-CM | POA: Diagnosis not present

## 2016-01-03 DIAGNOSIS — Z7952 Long term (current) use of systemic steroids: Secondary | ICD-10-CM | POA: Diagnosis not present

## 2016-01-04 DIAGNOSIS — M5030 Other cervical disc degeneration, unspecified cervical region: Secondary | ICD-10-CM | POA: Diagnosis not present

## 2016-01-04 DIAGNOSIS — M4316 Spondylolisthesis, lumbar region: Secondary | ICD-10-CM | POA: Diagnosis not present

## 2016-01-04 DIAGNOSIS — I10 Essential (primary) hypertension: Secondary | ICD-10-CM | POA: Diagnosis not present

## 2016-01-04 DIAGNOSIS — M545 Low back pain: Secondary | ICD-10-CM | POA: Diagnosis not present

## 2016-01-04 DIAGNOSIS — S32010D Wedge compression fracture of first lumbar vertebra, subsequent encounter for fracture with routine healing: Secondary | ICD-10-CM | POA: Diagnosis not present

## 2016-01-04 DIAGNOSIS — Z7952 Long term (current) use of systemic steroids: Secondary | ICD-10-CM | POA: Diagnosis not present

## 2016-01-05 DIAGNOSIS — I10 Essential (primary) hypertension: Secondary | ICD-10-CM | POA: Diagnosis not present

## 2016-01-05 DIAGNOSIS — Z7952 Long term (current) use of systemic steroids: Secondary | ICD-10-CM | POA: Diagnosis not present

## 2016-01-05 DIAGNOSIS — F0390 Unspecified dementia without behavioral disturbance: Secondary | ICD-10-CM | POA: Diagnosis not present

## 2016-01-05 DIAGNOSIS — M5030 Other cervical disc degeneration, unspecified cervical region: Secondary | ICD-10-CM | POA: Diagnosis not present

## 2016-01-05 DIAGNOSIS — M545 Low back pain: Secondary | ICD-10-CM | POA: Diagnosis not present

## 2016-01-05 DIAGNOSIS — S32010D Wedge compression fracture of first lumbar vertebra, subsequent encounter for fracture with routine healing: Secondary | ICD-10-CM | POA: Diagnosis not present

## 2016-01-05 DIAGNOSIS — G239 Degenerative disease of basal ganglia, unspecified: Secondary | ICD-10-CM | POA: Diagnosis not present

## 2016-01-05 DIAGNOSIS — M4316 Spondylolisthesis, lumbar region: Secondary | ICD-10-CM | POA: Diagnosis not present

## 2016-01-06 DIAGNOSIS — D649 Anemia, unspecified: Secondary | ICD-10-CM | POA: Diagnosis not present

## 2016-01-06 DIAGNOSIS — R011 Cardiac murmur, unspecified: Secondary | ICD-10-CM | POA: Diagnosis not present

## 2016-01-06 DIAGNOSIS — S329XXS Fracture of unspecified parts of lumbosacral spine and pelvis, sequela: Secondary | ICD-10-CM | POA: Diagnosis not present

## 2016-01-06 DIAGNOSIS — I10 Essential (primary) hypertension: Secondary | ICD-10-CM | POA: Diagnosis not present

## 2016-01-07 DIAGNOSIS — M4316 Spondylolisthesis, lumbar region: Secondary | ICD-10-CM | POA: Diagnosis not present

## 2016-01-07 DIAGNOSIS — Z7952 Long term (current) use of systemic steroids: Secondary | ICD-10-CM | POA: Diagnosis not present

## 2016-01-07 DIAGNOSIS — M545 Low back pain: Secondary | ICD-10-CM | POA: Diagnosis not present

## 2016-01-07 DIAGNOSIS — I10 Essential (primary) hypertension: Secondary | ICD-10-CM | POA: Diagnosis not present

## 2016-01-07 DIAGNOSIS — M5030 Other cervical disc degeneration, unspecified cervical region: Secondary | ICD-10-CM | POA: Diagnosis not present

## 2016-01-07 DIAGNOSIS — S32010D Wedge compression fracture of first lumbar vertebra, subsequent encounter for fracture with routine healing: Secondary | ICD-10-CM | POA: Diagnosis not present

## 2016-01-10 DIAGNOSIS — I10 Essential (primary) hypertension: Secondary | ICD-10-CM | POA: Diagnosis not present

## 2016-01-10 DIAGNOSIS — M545 Low back pain: Secondary | ICD-10-CM | POA: Diagnosis not present

## 2016-01-10 DIAGNOSIS — M4316 Spondylolisthesis, lumbar region: Secondary | ICD-10-CM | POA: Diagnosis not present

## 2016-01-10 DIAGNOSIS — S32010D Wedge compression fracture of first lumbar vertebra, subsequent encounter for fracture with routine healing: Secondary | ICD-10-CM | POA: Diagnosis not present

## 2016-01-10 DIAGNOSIS — M5030 Other cervical disc degeneration, unspecified cervical region: Secondary | ICD-10-CM | POA: Diagnosis not present

## 2016-01-10 DIAGNOSIS — Z7952 Long term (current) use of systemic steroids: Secondary | ICD-10-CM | POA: Diagnosis not present

## 2016-01-11 DIAGNOSIS — M545 Low back pain: Secondary | ICD-10-CM | POA: Diagnosis not present

## 2016-01-11 DIAGNOSIS — I10 Essential (primary) hypertension: Secondary | ICD-10-CM | POA: Diagnosis not present

## 2016-01-11 DIAGNOSIS — S32010D Wedge compression fracture of first lumbar vertebra, subsequent encounter for fracture with routine healing: Secondary | ICD-10-CM | POA: Diagnosis not present

## 2016-01-11 DIAGNOSIS — M5030 Other cervical disc degeneration, unspecified cervical region: Secondary | ICD-10-CM | POA: Diagnosis not present

## 2016-01-11 DIAGNOSIS — Z7952 Long term (current) use of systemic steroids: Secondary | ICD-10-CM | POA: Diagnosis not present

## 2016-01-11 DIAGNOSIS — M4316 Spondylolisthesis, lumbar region: Secondary | ICD-10-CM | POA: Diagnosis not present

## 2016-01-14 DIAGNOSIS — D649 Anemia, unspecified: Secondary | ICD-10-CM | POA: Diagnosis not present

## 2016-01-19 DIAGNOSIS — I10 Essential (primary) hypertension: Secondary | ICD-10-CM | POA: Diagnosis not present

## 2016-01-19 DIAGNOSIS — Z7952 Long term (current) use of systemic steroids: Secondary | ICD-10-CM | POA: Diagnosis not present

## 2016-01-19 DIAGNOSIS — M4316 Spondylolisthesis, lumbar region: Secondary | ICD-10-CM | POA: Diagnosis not present

## 2016-01-19 DIAGNOSIS — M545 Low back pain: Secondary | ICD-10-CM | POA: Diagnosis not present

## 2016-01-19 DIAGNOSIS — S32010D Wedge compression fracture of first lumbar vertebra, subsequent encounter for fracture with routine healing: Secondary | ICD-10-CM | POA: Diagnosis not present

## 2016-01-19 DIAGNOSIS — M5030 Other cervical disc degeneration, unspecified cervical region: Secondary | ICD-10-CM | POA: Diagnosis not present

## 2016-01-21 DIAGNOSIS — M4316 Spondylolisthesis, lumbar region: Secondary | ICD-10-CM | POA: Diagnosis not present

## 2016-01-21 DIAGNOSIS — Z7952 Long term (current) use of systemic steroids: Secondary | ICD-10-CM | POA: Diagnosis not present

## 2016-01-21 DIAGNOSIS — I10 Essential (primary) hypertension: Secondary | ICD-10-CM | POA: Diagnosis not present

## 2016-01-21 DIAGNOSIS — M5030 Other cervical disc degeneration, unspecified cervical region: Secondary | ICD-10-CM | POA: Diagnosis not present

## 2016-01-21 DIAGNOSIS — M545 Low back pain: Secondary | ICD-10-CM | POA: Diagnosis not present

## 2016-01-21 DIAGNOSIS — S32010D Wedge compression fracture of first lumbar vertebra, subsequent encounter for fracture with routine healing: Secondary | ICD-10-CM | POA: Diagnosis not present

## 2016-01-24 DIAGNOSIS — I10 Essential (primary) hypertension: Secondary | ICD-10-CM | POA: Diagnosis not present

## 2016-01-24 DIAGNOSIS — R011 Cardiac murmur, unspecified: Secondary | ICD-10-CM | POA: Diagnosis not present

## 2016-01-27 DIAGNOSIS — M5030 Other cervical disc degeneration, unspecified cervical region: Secondary | ICD-10-CM | POA: Diagnosis not present

## 2016-01-27 DIAGNOSIS — S32010D Wedge compression fracture of first lumbar vertebra, subsequent encounter for fracture with routine healing: Secondary | ICD-10-CM | POA: Diagnosis not present

## 2016-01-27 DIAGNOSIS — Z7952 Long term (current) use of systemic steroids: Secondary | ICD-10-CM | POA: Diagnosis not present

## 2016-01-27 DIAGNOSIS — S329XXS Fracture of unspecified parts of lumbosacral spine and pelvis, sequela: Secondary | ICD-10-CM | POA: Diagnosis not present

## 2016-01-27 DIAGNOSIS — M4316 Spondylolisthesis, lumbar region: Secondary | ICD-10-CM | POA: Diagnosis not present

## 2016-01-27 DIAGNOSIS — M545 Low back pain: Secondary | ICD-10-CM | POA: Diagnosis not present

## 2016-01-27 DIAGNOSIS — M4856XS Collapsed vertebra, not elsewhere classified, lumbar region, sequela of fracture: Secondary | ICD-10-CM | POA: Diagnosis not present

## 2016-01-27 DIAGNOSIS — R011 Cardiac murmur, unspecified: Secondary | ICD-10-CM | POA: Diagnosis not present

## 2016-01-27 DIAGNOSIS — I10 Essential (primary) hypertension: Secondary | ICD-10-CM | POA: Diagnosis not present

## 2016-02-03 DIAGNOSIS — R011 Cardiac murmur, unspecified: Secondary | ICD-10-CM | POA: Diagnosis not present

## 2016-03-13 DIAGNOSIS — N390 Urinary tract infection, site not specified: Secondary | ICD-10-CM | POA: Diagnosis not present

## 2016-03-15 DIAGNOSIS — I10 Essential (primary) hypertension: Secondary | ICD-10-CM | POA: Diagnosis not present

## 2016-03-15 DIAGNOSIS — M169 Osteoarthritis of hip, unspecified: Secondary | ICD-10-CM | POA: Diagnosis not present

## 2016-03-15 DIAGNOSIS — R358 Other polyuria: Secondary | ICD-10-CM | POA: Diagnosis not present

## 2016-03-15 DIAGNOSIS — N390 Urinary tract infection, site not specified: Secondary | ICD-10-CM | POA: Diagnosis not present

## 2016-03-16 DIAGNOSIS — I10 Essential (primary) hypertension: Secondary | ICD-10-CM | POA: Diagnosis not present

## 2016-03-16 DIAGNOSIS — M4316 Spondylolisthesis, lumbar region: Secondary | ICD-10-CM | POA: Diagnosis not present

## 2016-03-16 DIAGNOSIS — Z7982 Long term (current) use of aspirin: Secondary | ICD-10-CM | POA: Diagnosis not present

## 2016-03-16 DIAGNOSIS — Z9181 History of falling: Secondary | ICD-10-CM | POA: Diagnosis not present

## 2016-03-16 DIAGNOSIS — N390 Urinary tract infection, site not specified: Secondary | ICD-10-CM | POA: Diagnosis not present

## 2016-03-16 DIAGNOSIS — M5030 Other cervical disc degeneration, unspecified cervical region: Secondary | ICD-10-CM | POA: Diagnosis not present

## 2016-03-16 DIAGNOSIS — Z8781 Personal history of (healed) traumatic fracture: Secondary | ICD-10-CM | POA: Diagnosis not present

## 2016-03-22 DIAGNOSIS — I10 Essential (primary) hypertension: Secondary | ICD-10-CM | POA: Diagnosis not present

## 2016-03-22 DIAGNOSIS — Z9181 History of falling: Secondary | ICD-10-CM | POA: Diagnosis not present

## 2016-03-22 DIAGNOSIS — M5030 Other cervical disc degeneration, unspecified cervical region: Secondary | ICD-10-CM | POA: Diagnosis not present

## 2016-03-22 DIAGNOSIS — N390 Urinary tract infection, site not specified: Secondary | ICD-10-CM | POA: Diagnosis not present

## 2016-03-22 DIAGNOSIS — M4316 Spondylolisthesis, lumbar region: Secondary | ICD-10-CM | POA: Diagnosis not present

## 2016-03-22 DIAGNOSIS — Z8781 Personal history of (healed) traumatic fracture: Secondary | ICD-10-CM | POA: Diagnosis not present

## 2016-03-24 DIAGNOSIS — Z9181 History of falling: Secondary | ICD-10-CM | POA: Diagnosis not present

## 2016-03-24 DIAGNOSIS — N390 Urinary tract infection, site not specified: Secondary | ICD-10-CM | POA: Diagnosis not present

## 2016-03-24 DIAGNOSIS — I10 Essential (primary) hypertension: Secondary | ICD-10-CM | POA: Diagnosis not present

## 2016-03-24 DIAGNOSIS — M4316 Spondylolisthesis, lumbar region: Secondary | ICD-10-CM | POA: Diagnosis not present

## 2016-03-24 DIAGNOSIS — M5030 Other cervical disc degeneration, unspecified cervical region: Secondary | ICD-10-CM | POA: Diagnosis not present

## 2016-03-24 DIAGNOSIS — Z8781 Personal history of (healed) traumatic fracture: Secondary | ICD-10-CM | POA: Diagnosis not present

## 2016-03-28 DIAGNOSIS — Z9181 History of falling: Secondary | ICD-10-CM | POA: Diagnosis not present

## 2016-03-28 DIAGNOSIS — I10 Essential (primary) hypertension: Secondary | ICD-10-CM | POA: Diagnosis not present

## 2016-03-28 DIAGNOSIS — N390 Urinary tract infection, site not specified: Secondary | ICD-10-CM | POA: Diagnosis not present

## 2016-03-28 DIAGNOSIS — Z8781 Personal history of (healed) traumatic fracture: Secondary | ICD-10-CM | POA: Diagnosis not present

## 2016-03-28 DIAGNOSIS — M4316 Spondylolisthesis, lumbar region: Secondary | ICD-10-CM | POA: Diagnosis not present

## 2016-03-28 DIAGNOSIS — M5030 Other cervical disc degeneration, unspecified cervical region: Secondary | ICD-10-CM | POA: Diagnosis not present

## 2016-03-30 DIAGNOSIS — Z8781 Personal history of (healed) traumatic fracture: Secondary | ICD-10-CM | POA: Diagnosis not present

## 2016-03-30 DIAGNOSIS — N390 Urinary tract infection, site not specified: Secondary | ICD-10-CM | POA: Diagnosis not present

## 2016-03-30 DIAGNOSIS — M4316 Spondylolisthesis, lumbar region: Secondary | ICD-10-CM | POA: Diagnosis not present

## 2016-03-30 DIAGNOSIS — M5030 Other cervical disc degeneration, unspecified cervical region: Secondary | ICD-10-CM | POA: Diagnosis not present

## 2016-03-30 DIAGNOSIS — I10 Essential (primary) hypertension: Secondary | ICD-10-CM | POA: Diagnosis not present

## 2016-03-30 DIAGNOSIS — Z9181 History of falling: Secondary | ICD-10-CM | POA: Diagnosis not present

## 2016-04-03 DIAGNOSIS — M4316 Spondylolisthesis, lumbar region: Secondary | ICD-10-CM | POA: Diagnosis not present

## 2016-04-03 DIAGNOSIS — I10 Essential (primary) hypertension: Secondary | ICD-10-CM | POA: Diagnosis not present

## 2016-04-03 DIAGNOSIS — Z9181 History of falling: Secondary | ICD-10-CM | POA: Diagnosis not present

## 2016-04-03 DIAGNOSIS — Z8781 Personal history of (healed) traumatic fracture: Secondary | ICD-10-CM | POA: Diagnosis not present

## 2016-04-03 DIAGNOSIS — M5030 Other cervical disc degeneration, unspecified cervical region: Secondary | ICD-10-CM | POA: Diagnosis not present

## 2016-04-03 DIAGNOSIS — N390 Urinary tract infection, site not specified: Secondary | ICD-10-CM | POA: Diagnosis not present

## 2016-04-04 DIAGNOSIS — I10 Essential (primary) hypertension: Secondary | ICD-10-CM | POA: Diagnosis not present

## 2016-04-04 DIAGNOSIS — M4316 Spondylolisthesis, lumbar region: Secondary | ICD-10-CM | POA: Diagnosis not present

## 2016-04-04 DIAGNOSIS — Z8781 Personal history of (healed) traumatic fracture: Secondary | ICD-10-CM | POA: Diagnosis not present

## 2016-04-04 DIAGNOSIS — N390 Urinary tract infection, site not specified: Secondary | ICD-10-CM | POA: Diagnosis not present

## 2016-04-04 DIAGNOSIS — Z9181 History of falling: Secondary | ICD-10-CM | POA: Diagnosis not present

## 2016-04-04 DIAGNOSIS — M5030 Other cervical disc degeneration, unspecified cervical region: Secondary | ICD-10-CM | POA: Diagnosis not present

## 2016-04-05 DIAGNOSIS — M4316 Spondylolisthesis, lumbar region: Secondary | ICD-10-CM | POA: Diagnosis not present

## 2016-04-05 DIAGNOSIS — N390 Urinary tract infection, site not specified: Secondary | ICD-10-CM | POA: Diagnosis not present

## 2016-04-05 DIAGNOSIS — Z9181 History of falling: Secondary | ICD-10-CM | POA: Diagnosis not present

## 2016-04-05 DIAGNOSIS — Z8781 Personal history of (healed) traumatic fracture: Secondary | ICD-10-CM | POA: Diagnosis not present

## 2016-04-05 DIAGNOSIS — M5030 Other cervical disc degeneration, unspecified cervical region: Secondary | ICD-10-CM | POA: Diagnosis not present

## 2016-04-05 DIAGNOSIS — I10 Essential (primary) hypertension: Secondary | ICD-10-CM | POA: Diagnosis not present

## 2016-04-11 DIAGNOSIS — M4316 Spondylolisthesis, lumbar region: Secondary | ICD-10-CM | POA: Diagnosis not present

## 2016-04-11 DIAGNOSIS — M5030 Other cervical disc degeneration, unspecified cervical region: Secondary | ICD-10-CM | POA: Diagnosis not present

## 2016-04-11 DIAGNOSIS — N390 Urinary tract infection, site not specified: Secondary | ICD-10-CM | POA: Diagnosis not present

## 2016-04-11 DIAGNOSIS — Z8781 Personal history of (healed) traumatic fracture: Secondary | ICD-10-CM | POA: Diagnosis not present

## 2016-04-11 DIAGNOSIS — Z9181 History of falling: Secondary | ICD-10-CM | POA: Diagnosis not present

## 2016-04-11 DIAGNOSIS — I10 Essential (primary) hypertension: Secondary | ICD-10-CM | POA: Diagnosis not present

## 2016-04-13 DIAGNOSIS — M5030 Other cervical disc degeneration, unspecified cervical region: Secondary | ICD-10-CM | POA: Diagnosis not present

## 2016-04-13 DIAGNOSIS — M4316 Spondylolisthesis, lumbar region: Secondary | ICD-10-CM | POA: Diagnosis not present

## 2016-04-13 DIAGNOSIS — Z8781 Personal history of (healed) traumatic fracture: Secondary | ICD-10-CM | POA: Diagnosis not present

## 2016-04-13 DIAGNOSIS — Z9181 History of falling: Secondary | ICD-10-CM | POA: Diagnosis not present

## 2016-04-13 DIAGNOSIS — N390 Urinary tract infection, site not specified: Secondary | ICD-10-CM | POA: Diagnosis not present

## 2016-04-13 DIAGNOSIS — I10 Essential (primary) hypertension: Secondary | ICD-10-CM | POA: Diagnosis not present

## 2016-04-14 DIAGNOSIS — Z8781 Personal history of (healed) traumatic fracture: Secondary | ICD-10-CM | POA: Diagnosis not present

## 2016-04-14 DIAGNOSIS — M4316 Spondylolisthesis, lumbar region: Secondary | ICD-10-CM | POA: Diagnosis not present

## 2016-04-14 DIAGNOSIS — Z9181 History of falling: Secondary | ICD-10-CM | POA: Diagnosis not present

## 2016-04-14 DIAGNOSIS — N390 Urinary tract infection, site not specified: Secondary | ICD-10-CM | POA: Diagnosis not present

## 2016-04-14 DIAGNOSIS — M5030 Other cervical disc degeneration, unspecified cervical region: Secondary | ICD-10-CM | POA: Diagnosis not present

## 2016-04-14 DIAGNOSIS — I10 Essential (primary) hypertension: Secondary | ICD-10-CM | POA: Diagnosis not present

## 2016-04-15 DIAGNOSIS — Z9181 History of falling: Secondary | ICD-10-CM | POA: Diagnosis not present

## 2016-04-15 DIAGNOSIS — N390 Urinary tract infection, site not specified: Secondary | ICD-10-CM | POA: Diagnosis not present

## 2016-04-15 DIAGNOSIS — M4316 Spondylolisthesis, lumbar region: Secondary | ICD-10-CM | POA: Diagnosis not present

## 2016-04-15 DIAGNOSIS — M5030 Other cervical disc degeneration, unspecified cervical region: Secondary | ICD-10-CM | POA: Diagnosis not present

## 2016-04-15 DIAGNOSIS — Z8781 Personal history of (healed) traumatic fracture: Secondary | ICD-10-CM | POA: Diagnosis not present

## 2016-04-15 DIAGNOSIS — I10 Essential (primary) hypertension: Secondary | ICD-10-CM | POA: Diagnosis not present

## 2016-04-17 DIAGNOSIS — I10 Essential (primary) hypertension: Secondary | ICD-10-CM | POA: Diagnosis not present

## 2016-04-17 DIAGNOSIS — Z9181 History of falling: Secondary | ICD-10-CM | POA: Diagnosis not present

## 2016-04-17 DIAGNOSIS — M4316 Spondylolisthesis, lumbar region: Secondary | ICD-10-CM | POA: Diagnosis not present

## 2016-04-17 DIAGNOSIS — N390 Urinary tract infection, site not specified: Secondary | ICD-10-CM | POA: Diagnosis not present

## 2016-04-17 DIAGNOSIS — M5030 Other cervical disc degeneration, unspecified cervical region: Secondary | ICD-10-CM | POA: Diagnosis not present

## 2016-04-17 DIAGNOSIS — Z8781 Personal history of (healed) traumatic fracture: Secondary | ICD-10-CM | POA: Diagnosis not present

## 2016-04-19 DIAGNOSIS — M5030 Other cervical disc degeneration, unspecified cervical region: Secondary | ICD-10-CM | POA: Diagnosis not present

## 2016-04-19 DIAGNOSIS — M4316 Spondylolisthesis, lumbar region: Secondary | ICD-10-CM | POA: Diagnosis not present

## 2016-04-19 DIAGNOSIS — I10 Essential (primary) hypertension: Secondary | ICD-10-CM | POA: Diagnosis not present

## 2016-04-19 DIAGNOSIS — N390 Urinary tract infection, site not specified: Secondary | ICD-10-CM | POA: Diagnosis not present

## 2016-04-19 DIAGNOSIS — Z8781 Personal history of (healed) traumatic fracture: Secondary | ICD-10-CM | POA: Diagnosis not present

## 2016-04-19 DIAGNOSIS — Z9181 History of falling: Secondary | ICD-10-CM | POA: Diagnosis not present

## 2016-04-21 DIAGNOSIS — Z9181 History of falling: Secondary | ICD-10-CM | POA: Diagnosis not present

## 2016-04-21 DIAGNOSIS — I10 Essential (primary) hypertension: Secondary | ICD-10-CM | POA: Diagnosis not present

## 2016-04-21 DIAGNOSIS — N390 Urinary tract infection, site not specified: Secondary | ICD-10-CM | POA: Diagnosis not present

## 2016-04-21 DIAGNOSIS — M4316 Spondylolisthesis, lumbar region: Secondary | ICD-10-CM | POA: Diagnosis not present

## 2016-04-21 DIAGNOSIS — Z8781 Personal history of (healed) traumatic fracture: Secondary | ICD-10-CM | POA: Diagnosis not present

## 2016-04-21 DIAGNOSIS — M5030 Other cervical disc degeneration, unspecified cervical region: Secondary | ICD-10-CM | POA: Diagnosis not present

## 2016-04-22 DIAGNOSIS — M4316 Spondylolisthesis, lumbar region: Secondary | ICD-10-CM | POA: Diagnosis not present

## 2016-04-22 DIAGNOSIS — N390 Urinary tract infection, site not specified: Secondary | ICD-10-CM | POA: Diagnosis not present

## 2016-04-22 DIAGNOSIS — M5030 Other cervical disc degeneration, unspecified cervical region: Secondary | ICD-10-CM | POA: Diagnosis not present

## 2016-04-22 DIAGNOSIS — Z8781 Personal history of (healed) traumatic fracture: Secondary | ICD-10-CM | POA: Diagnosis not present

## 2016-04-22 DIAGNOSIS — Z9181 History of falling: Secondary | ICD-10-CM | POA: Diagnosis not present

## 2016-04-22 DIAGNOSIS — I10 Essential (primary) hypertension: Secondary | ICD-10-CM | POA: Diagnosis not present

## 2016-04-24 DIAGNOSIS — I10 Essential (primary) hypertension: Secondary | ICD-10-CM | POA: Diagnosis not present

## 2016-04-24 DIAGNOSIS — Z9181 History of falling: Secondary | ICD-10-CM | POA: Diagnosis not present

## 2016-04-24 DIAGNOSIS — Z8781 Personal history of (healed) traumatic fracture: Secondary | ICD-10-CM | POA: Diagnosis not present

## 2016-04-24 DIAGNOSIS — M5030 Other cervical disc degeneration, unspecified cervical region: Secondary | ICD-10-CM | POA: Diagnosis not present

## 2016-04-24 DIAGNOSIS — M4316 Spondylolisthesis, lumbar region: Secondary | ICD-10-CM | POA: Diagnosis not present

## 2016-04-24 DIAGNOSIS — N390 Urinary tract infection, site not specified: Secondary | ICD-10-CM | POA: Diagnosis not present

## 2016-04-25 DIAGNOSIS — N390 Urinary tract infection, site not specified: Secondary | ICD-10-CM | POA: Diagnosis not present

## 2016-04-26 DIAGNOSIS — I10 Essential (primary) hypertension: Secondary | ICD-10-CM | POA: Diagnosis not present

## 2016-04-26 DIAGNOSIS — M4316 Spondylolisthesis, lumbar region: Secondary | ICD-10-CM | POA: Diagnosis not present

## 2016-04-26 DIAGNOSIS — N390 Urinary tract infection, site not specified: Secondary | ICD-10-CM | POA: Diagnosis not present

## 2016-04-26 DIAGNOSIS — M5030 Other cervical disc degeneration, unspecified cervical region: Secondary | ICD-10-CM | POA: Diagnosis not present

## 2016-04-26 DIAGNOSIS — Z8781 Personal history of (healed) traumatic fracture: Secondary | ICD-10-CM | POA: Diagnosis not present

## 2016-04-26 DIAGNOSIS — Z9181 History of falling: Secondary | ICD-10-CM | POA: Diagnosis not present

## 2016-04-27 DIAGNOSIS — N390 Urinary tract infection, site not specified: Secondary | ICD-10-CM | POA: Diagnosis not present

## 2016-04-27 DIAGNOSIS — M5030 Other cervical disc degeneration, unspecified cervical region: Secondary | ICD-10-CM | POA: Diagnosis not present

## 2016-04-27 DIAGNOSIS — I10 Essential (primary) hypertension: Secondary | ICD-10-CM | POA: Diagnosis not present

## 2016-04-27 DIAGNOSIS — Z9181 History of falling: Secondary | ICD-10-CM | POA: Diagnosis not present

## 2016-04-27 DIAGNOSIS — Z8781 Personal history of (healed) traumatic fracture: Secondary | ICD-10-CM | POA: Diagnosis not present

## 2016-04-27 DIAGNOSIS — M4316 Spondylolisthesis, lumbar region: Secondary | ICD-10-CM | POA: Diagnosis not present

## 2016-04-29 DIAGNOSIS — N390 Urinary tract infection, site not specified: Secondary | ICD-10-CM | POA: Diagnosis not present

## 2016-04-29 DIAGNOSIS — M5030 Other cervical disc degeneration, unspecified cervical region: Secondary | ICD-10-CM | POA: Diagnosis not present

## 2016-04-29 DIAGNOSIS — Z9181 History of falling: Secondary | ICD-10-CM | POA: Diagnosis not present

## 2016-04-29 DIAGNOSIS — M4316 Spondylolisthesis, lumbar region: Secondary | ICD-10-CM | POA: Diagnosis not present

## 2016-04-29 DIAGNOSIS — Z8781 Personal history of (healed) traumatic fracture: Secondary | ICD-10-CM | POA: Diagnosis not present

## 2016-04-29 DIAGNOSIS — I10 Essential (primary) hypertension: Secondary | ICD-10-CM | POA: Diagnosis not present

## 2016-05-01 DIAGNOSIS — Z9181 History of falling: Secondary | ICD-10-CM | POA: Diagnosis not present

## 2016-05-01 DIAGNOSIS — I10 Essential (primary) hypertension: Secondary | ICD-10-CM | POA: Diagnosis not present

## 2016-05-01 DIAGNOSIS — M5030 Other cervical disc degeneration, unspecified cervical region: Secondary | ICD-10-CM | POA: Diagnosis not present

## 2016-05-01 DIAGNOSIS — M4316 Spondylolisthesis, lumbar region: Secondary | ICD-10-CM | POA: Diagnosis not present

## 2016-05-01 DIAGNOSIS — Z8781 Personal history of (healed) traumatic fracture: Secondary | ICD-10-CM | POA: Diagnosis not present

## 2016-05-01 DIAGNOSIS — N390 Urinary tract infection, site not specified: Secondary | ICD-10-CM | POA: Diagnosis not present

## 2016-05-02 DIAGNOSIS — I10 Essential (primary) hypertension: Secondary | ICD-10-CM | POA: Diagnosis not present

## 2016-05-02 DIAGNOSIS — D649 Anemia, unspecified: Secondary | ICD-10-CM | POA: Diagnosis not present

## 2016-05-02 DIAGNOSIS — B351 Tinea unguium: Secondary | ICD-10-CM | POA: Diagnosis not present

## 2016-05-04 DIAGNOSIS — Z0000 Encounter for general adult medical examination without abnormal findings: Secondary | ICD-10-CM | POA: Diagnosis not present

## 2016-05-04 DIAGNOSIS — M4316 Spondylolisthesis, lumbar region: Secondary | ICD-10-CM | POA: Diagnosis not present

## 2016-05-04 DIAGNOSIS — I10 Essential (primary) hypertension: Secondary | ICD-10-CM | POA: Diagnosis not present

## 2016-05-04 DIAGNOSIS — M169 Osteoarthritis of hip, unspecified: Secondary | ICD-10-CM | POA: Diagnosis not present

## 2016-05-04 DIAGNOSIS — M4856XD Collapsed vertebra, not elsewhere classified, lumbar region, subsequent encounter for fracture with routine healing: Secondary | ICD-10-CM | POA: Diagnosis not present

## 2016-05-04 DIAGNOSIS — D649 Anemia, unspecified: Secondary | ICD-10-CM | POA: Diagnosis not present

## 2016-05-04 DIAGNOSIS — Z9181 History of falling: Secondary | ICD-10-CM | POA: Diagnosis not present

## 2016-05-04 DIAGNOSIS — Z8781 Personal history of (healed) traumatic fracture: Secondary | ICD-10-CM | POA: Diagnosis not present

## 2016-05-04 DIAGNOSIS — M5030 Other cervical disc degeneration, unspecified cervical region: Secondary | ICD-10-CM | POA: Diagnosis not present

## 2016-05-04 DIAGNOSIS — N390 Urinary tract infection, site not specified: Secondary | ICD-10-CM | POA: Diagnosis not present

## 2016-05-12 DIAGNOSIS — M4856XD Collapsed vertebra, not elsewhere classified, lumbar region, subsequent encounter for fracture with routine healing: Secondary | ICD-10-CM | POA: Diagnosis not present

## 2016-05-12 DIAGNOSIS — I10 Essential (primary) hypertension: Secondary | ICD-10-CM | POA: Diagnosis not present

## 2016-05-12 DIAGNOSIS — R011 Cardiac murmur, unspecified: Secondary | ICD-10-CM | POA: Diagnosis not present

## 2016-05-18 DIAGNOSIS — Z23 Encounter for immunization: Secondary | ICD-10-CM | POA: Diagnosis not present

## 2016-06-01 DIAGNOSIS — I10 Essential (primary) hypertension: Secondary | ICD-10-CM | POA: Diagnosis not present

## 2016-06-01 DIAGNOSIS — Z9181 History of falling: Secondary | ICD-10-CM | POA: Diagnosis not present

## 2016-06-01 DIAGNOSIS — N390 Urinary tract infection, site not specified: Secondary | ICD-10-CM | POA: Diagnosis not present

## 2016-06-01 DIAGNOSIS — M4316 Spondylolisthesis, lumbar region: Secondary | ICD-10-CM | POA: Diagnosis not present

## 2016-06-02 DIAGNOSIS — H1011 Acute atopic conjunctivitis, right eye: Secondary | ICD-10-CM | POA: Diagnosis not present

## 2016-06-02 DIAGNOSIS — H6123 Impacted cerumen, bilateral: Secondary | ICD-10-CM | POA: Diagnosis not present

## 2016-06-02 DIAGNOSIS — J302 Other seasonal allergic rhinitis: Secondary | ICD-10-CM | POA: Diagnosis not present

## 2016-06-02 DIAGNOSIS — I10 Essential (primary) hypertension: Secondary | ICD-10-CM | POA: Diagnosis not present

## 2016-06-07 DIAGNOSIS — I10 Essential (primary) hypertension: Secondary | ICD-10-CM | POA: Diagnosis not present

## 2016-06-07 DIAGNOSIS — J302 Other seasonal allergic rhinitis: Secondary | ICD-10-CM | POA: Diagnosis not present

## 2016-06-07 DIAGNOSIS — H1011 Acute atopic conjunctivitis, right eye: Secondary | ICD-10-CM | POA: Diagnosis not present

## 2016-06-16 DIAGNOSIS — M2040 Other hammer toe(s) (acquired), unspecified foot: Secondary | ICD-10-CM | POA: Diagnosis not present

## 2016-06-16 DIAGNOSIS — L853 Xerosis cutis: Secondary | ICD-10-CM | POA: Diagnosis not present

## 2016-06-16 DIAGNOSIS — R600 Localized edema: Secondary | ICD-10-CM | POA: Diagnosis not present

## 2016-07-22 ENCOUNTER — Emergency Department (HOSPITAL_COMMUNITY)
Admission: EM | Admit: 2016-07-22 | Discharge: 2016-07-22 | Disposition: A | Discharge status: Home / Self Care | Payer: Medicare Other | Attending: Emergency Medicine | Admitting: Emergency Medicine

## 2016-07-22 ENCOUNTER — Encounter (HOSPITAL_COMMUNITY): Payer: Self-pay | Admitting: Emergency Medicine

## 2016-07-22 DIAGNOSIS — Z79899 Other long term (current) drug therapy: Secondary | ICD-10-CM | POA: Insufficient documentation

## 2016-07-22 DIAGNOSIS — Z791 Long term (current) use of non-steroidal anti-inflammatories (NSAID): Secondary | ICD-10-CM | POA: Insufficient documentation

## 2016-07-22 DIAGNOSIS — I10 Essential (primary) hypertension: Secondary | ICD-10-CM | POA: Diagnosis not present

## 2016-07-22 DIAGNOSIS — Z7982 Long term (current) use of aspirin: Secondary | ICD-10-CM | POA: Diagnosis not present

## 2016-07-22 LAB — I-STAT CHEM 8, ED
BUN: 25 mg/dL — AB (ref 6–20)
CALCIUM ION: 1.18 mmol/L (ref 1.15–1.40)
CREATININE: 1.1 mg/dL — AB (ref 0.44–1.00)
Chloride: 100 mmol/L — ABNORMAL LOW (ref 101–111)
GLUCOSE: 97 mg/dL (ref 65–99)
HCT: 29 % — ABNORMAL LOW (ref 36.0–46.0)
Hemoglobin: 9.9 g/dL — ABNORMAL LOW (ref 12.0–15.0)
Potassium: 4.1 mmol/L (ref 3.5–5.1)
Sodium: 137 mmol/L (ref 135–145)
TCO2: 25 mmol/L (ref 0–100)

## 2016-07-22 MED ORDER — AMLODIPINE BESYLATE 5 MG PO TABS
5.0000 mg | ORAL_TABLET | Freq: Once | ORAL | Status: AC
  Administered 2016-07-22: 5 mg via ORAL
  Filled 2016-07-22: qty 1

## 2016-07-22 NOTE — ED Triage Notes (Signed)
Patient BP was checked twice today at assistant nursing facility and systolic BP was 200.  They gave BP meds and rechecked later and still was high.  Patient denies blurred vision or headache.

## 2016-07-22 NOTE — ED Provider Notes (Signed)
[facility]-EMERGENCY DEPT Provider Note   CSN: [id number] Arrival date & time: 07/22/16  1725     History   Chief Complaint Chief Complaint  Patient presents with  . Hypertension    HPI Katherine Kirby is a 82 y.o. female.  HPI   Presents from assisted living facility for concern for elevated blood pressures.  200 systolic at facility and did not come back down so brought her in.  Taking losartan and metoprolol, no recent changes, receiving daily at facility. Patient without acute concerns, is asymptomatic. Denies CP, sob, headache, n/v, numbness/weakness, diff talking, new diff walking (uses walker).  Past Medical History:  Diagnosis Date  . Hypertension   . Pelvis fracture [facility](HCC)     Patient Active Problem List   Diagnosis Date Noted  . Compression fracture of lumbar spine, non-traumatic (HCC) 10/18/2015  . Lumbar compression fracture (HCC) 10/18/2015  . Spinal stenosis 10/18/2015  . Difficulty walking 10/18/2015  . UTI (lower urinary tract infection) 10/18/2015  . AKI (acute kidney injury) (HCC) 10/18/2015  . Hypertension 10/18/2015  . Hip pain   . Essential hypertension     History reviewed. No pertinent surgical history.  OB History    No data available       Home Medications    Prior to Admission medications   Medication Sig Start Date End Date Taking? Authorizing Provider  acetaminophen (TYLENOL) 500 MG tablet Take 1,000 mg by mouth every 8 (eight) hours as needed for moderate pain.   Yes Historical Provider, MD  aspirin 81 MG chewable tablet Chew 81 mg by mouth daily.   Yes Historical Provider, MD  calcitonin, salmon, (MIACALCIN/FORTICAL) 200 UNIT/ACT nasal spray Place 1 spray into alternate nostrils daily.   Yes Historical Provider, MD  calcium carbonate (OSCAL) 1500 (600 Ca) MG TABS tablet Take 1,500 mg by mouth daily.   Yes Historical Provider, MD  Cyanocobalamin (VITAMIN B 12 PO) Take 1 tablet by mouth daily.   Yes Historical Provider, MD  losartan  (COZAAR) 50 MG tablet Take 50 mg by mouth daily.   Yes Historical Provider, MD  metoprolol succinate (TOPROL-XL) 25 MG 24 hr tablet Take 25 mg by mouth 3 (three) times daily.    Yes Historical Provider, MD  bisacodyl (DULCOLAX) 5 MG EC tablet Take 2 tablets (10 mg total) by mouth daily as needed for moderate constipation. 10/21/15   Leana Roeawood S Elgergawy, MD  oxyCODONE (OXY IR/ROXICODONE) 5 MG immediate release tablet Take 1 tablet (5 mg total) by mouth every 4 (four) hours as needed for moderate pain. Patient not taking: Reported on 11/25/2015 10/21/15   Starleen Armsawood S Elgergawy, MD    Family History No family history on file.  Social History Social History  Substance Use Topics  . Smoking status: Never Smoker  . Smokeless tobacco: Never Used  . Alcohol use Yes     Comment: ocasional     Allergies   Patient has no known allergies.   Review of Systems Review of Systems  Constitutional: Negative for fever.  HENT: Negative for sore throat.   Eyes: Negative for visual disturbance.  Respiratory: Negative for cough and shortness of breath.   Cardiovascular: Negative for chest pain.  Gastrointestinal: Negative for abdominal pain.  Genitourinary: Negative for difficulty urinating.  Musculoskeletal: Negative for back pain and neck pain.  Skin: Negative for rash.  Neurological: Negative for dizziness, syncope, facial asymmetry, speech difficulty, weakness, light-headedness, numbness and headaches.     Physical Exam Updated Vital Signs BP 192/85  Pulse 72   Temp 97.5 F (36.4 C) (Oral)   Resp 16   Ht 5' (1.524 m)   Wt 110 lb (49.9 kg)   SpO2 94%   BMI 21.48 kg/m   Physical Exam  Constitutional: She is oriented to person, place, and time. She appears well-developed and well-nourished. No distress.  HENT:  Head: Normocephalic and atraumatic.  Eyes: Conjunctivae and EOM are normal.  Neck: Normal range of motion.  Cardiovascular: Normal rate, regular rhythm, normal heart sounds and  intact distal pulses.  Exam reveals no gallop and no friction rub.   No murmur heard. Pulmonary/Chest: Effort normal and breath sounds normal. No respiratory distress. She has no wheezes. She has no rales.  Abdominal: Soft. She exhibits no distension. There is no tenderness. There is no guarding.  Musculoskeletal: She exhibits no edema or tenderness.  Neurological: She is alert and oriented to person, place, and time. She has normal strength. No cranial nerve deficit or sensory deficit. Coordination (finger to nose and heel to shin) and gait (with walker) normal. GCS eye subscore is 4. GCS verbal subscore is 5. GCS motor subscore is 6.  Skin: Skin is warm and dry. No rash noted. She is not diaphoretic. No erythema.  Nursing note and vitals reviewed.    ED Treatments / Results  Labs (all labs ordered are listed, but only abnormal results are displayed) Labs Reviewed  I-STAT CHEM 8, ED - Abnormal; Notable for the following:       Result Value   Chloride 100 (*)    BUN 25 (*)    Creatinine, Ser 1.10 (*)    Hemoglobin 9.9 (*)    HCT 29.0 (*)    All other components within normal limits    EKG  EKG Interpretation None       Radiology No results found.  Procedures Procedures (including critical care time)  Medications Ordered in ED Medications  amLODipine (NORVASC) tablet 5 mg (5 mg Oral Given 07/22/16 1934)     Initial Impression / Assessment and Plan / ED Course  I have reviewed the triage vital signs and the nursing notes.  Pertinent labs & imaging results that were available during my care of the patient were reviewed by me and considered in my medical decision making (see chart for details).  Clinical Course    82yo female with history of hypertension presents with concern of hypertension.  When blood pressure was checked today, it was found to be 200s systolic and patient presented to the ED. On arrival to the ED, BP was 214/89.  Patient without headache, no  neurologic symptoms, no chest pain, no shortness of breath and have low suspicion for hypertensive emergencies including [facility], hypertensive encephalopathy, stroke, MI, aortic dissection, pulmonary edema.  Cr was checked showing increase in Cr to 1.1, with last check in march and unknown if this is new or chronically rising.  Provided patient with dose of amlodipine.  Talked about initiating this however she has developed leg swelling with it in past.  Pt has appointment in 2 days with PCP and given renal function, feel she is appropriate to follow up with PCP and discuss medication changes per PCP preference. BP 180s systolic prior to discharge, pt asymptomatic. Discussed importance of close primary care follow up and reasons to return to the ED in detail. Patient discharged in stable condition with understanding of reasons to return.    Final Clinical Impressions(s) / ED Diagnoses   Final  diagnoses:  Essential hypertension    New Prescriptions Discharge Medication List as of 07/22/2016  8:16 PM       Alvira Monday, MD 07/23/16 1222

## 2016-07-22 NOTE — ED Notes (Signed)
PT DISCHARGED. INSTRUCTIONS GIVEN. AAOX4. PT IN NO APPARENT DISTRESS OR PAIN. THE OPPORTUNITY TO ASK QUESTIONS WAS PROVIDED. 

## 2016-07-24 DIAGNOSIS — Z6821 Body mass index (BMI) 21.0-21.9, adult: Secondary | ICD-10-CM | POA: Diagnosis not present

## 2016-07-24 DIAGNOSIS — F411 Generalized anxiety disorder: Secondary | ICD-10-CM | POA: Diagnosis not present

## 2016-07-24 DIAGNOSIS — Z66 Do not resuscitate: Secondary | ICD-10-CM | POA: Diagnosis not present

## 2016-07-24 DIAGNOSIS — I10 Essential (primary) hypertension: Secondary | ICD-10-CM | POA: Diagnosis not present

## 2016-08-04 DIAGNOSIS — M5030 Other cervical disc degeneration, unspecified cervical region: Secondary | ICD-10-CM | POA: Diagnosis not present

## 2016-08-04 DIAGNOSIS — Z8781 Personal history of (healed) traumatic fracture: Secondary | ICD-10-CM | POA: Diagnosis not present

## 2016-08-04 DIAGNOSIS — Z9181 History of falling: Secondary | ICD-10-CM | POA: Diagnosis not present

## 2016-08-04 DIAGNOSIS — I10 Essential (primary) hypertension: Secondary | ICD-10-CM | POA: Diagnosis not present

## 2016-08-04 DIAGNOSIS — Z7982 Long term (current) use of aspirin: Secondary | ICD-10-CM | POA: Diagnosis not present

## 2016-08-04 DIAGNOSIS — R531 Weakness: Secondary | ICD-10-CM | POA: Diagnosis not present

## 2016-08-04 DIAGNOSIS — W19XXXD Unspecified fall, subsequent encounter: Secondary | ICD-10-CM | POA: Diagnosis not present

## 2016-08-04 DIAGNOSIS — M4316 Spondylolisthesis, lumbar region: Secondary | ICD-10-CM | POA: Diagnosis not present

## 2016-08-04 DIAGNOSIS — S51802D Unspecified open wound of left forearm, subsequent encounter: Secondary | ICD-10-CM | POA: Diagnosis not present

## 2016-08-04 DIAGNOSIS — N390 Urinary tract infection, site not specified: Secondary | ICD-10-CM | POA: Diagnosis not present

## 2016-08-04 DIAGNOSIS — R350 Frequency of micturition: Secondary | ICD-10-CM | POA: Diagnosis not present

## 2016-08-04 DIAGNOSIS — R296 Repeated falls: Secondary | ICD-10-CM | POA: Diagnosis not present

## 2016-08-07 DIAGNOSIS — I10 Essential (primary) hypertension: Secondary | ICD-10-CM | POA: Diagnosis not present

## 2016-08-07 DIAGNOSIS — W19XXXD Unspecified fall, subsequent encounter: Secondary | ICD-10-CM | POA: Diagnosis not present

## 2016-08-07 DIAGNOSIS — N390 Urinary tract infection, site not specified: Secondary | ICD-10-CM | POA: Diagnosis not present

## 2016-08-07 DIAGNOSIS — S51802D Unspecified open wound of left forearm, subsequent encounter: Secondary | ICD-10-CM | POA: Diagnosis not present

## 2016-08-07 DIAGNOSIS — Z9181 History of falling: Secondary | ICD-10-CM | POA: Diagnosis not present

## 2016-08-07 DIAGNOSIS — R296 Repeated falls: Secondary | ICD-10-CM | POA: Diagnosis not present

## 2016-08-08 DIAGNOSIS — W19XXXD Unspecified fall, subsequent encounter: Secondary | ICD-10-CM | POA: Diagnosis not present

## 2016-08-08 DIAGNOSIS — I10 Essential (primary) hypertension: Secondary | ICD-10-CM | POA: Diagnosis not present

## 2016-08-08 DIAGNOSIS — N390 Urinary tract infection, site not specified: Secondary | ICD-10-CM | POA: Diagnosis not present

## 2016-08-08 DIAGNOSIS — R296 Repeated falls: Secondary | ICD-10-CM | POA: Diagnosis not present

## 2016-08-08 DIAGNOSIS — Z6820 Body mass index (BMI) 20.0-20.9, adult: Secondary | ICD-10-CM | POA: Diagnosis not present

## 2016-08-08 DIAGNOSIS — Z9181 History of falling: Secondary | ICD-10-CM | POA: Diagnosis not present

## 2016-08-08 DIAGNOSIS — S51802D Unspecified open wound of left forearm, subsequent encounter: Secondary | ICD-10-CM | POA: Diagnosis not present

## 2016-08-08 DIAGNOSIS — F411 Generalized anxiety disorder: Secondary | ICD-10-CM | POA: Diagnosis not present

## 2016-08-09 DIAGNOSIS — I10 Essential (primary) hypertension: Secondary | ICD-10-CM | POA: Diagnosis not present

## 2016-08-09 DIAGNOSIS — Q248 Other specified congenital malformations of heart: Secondary | ICD-10-CM | POA: Diagnosis not present

## 2016-08-11 DIAGNOSIS — I10 Essential (primary) hypertension: Secondary | ICD-10-CM | POA: Diagnosis not present

## 2016-08-11 DIAGNOSIS — N390 Urinary tract infection, site not specified: Secondary | ICD-10-CM | POA: Diagnosis not present

## 2016-08-11 DIAGNOSIS — S51802D Unspecified open wound of left forearm, subsequent encounter: Secondary | ICD-10-CM | POA: Diagnosis not present

## 2016-08-11 DIAGNOSIS — R296 Repeated falls: Secondary | ICD-10-CM | POA: Diagnosis not present

## 2016-08-11 DIAGNOSIS — W19XXXD Unspecified fall, subsequent encounter: Secondary | ICD-10-CM | POA: Diagnosis not present

## 2016-08-11 DIAGNOSIS — Z9181 History of falling: Secondary | ICD-10-CM | POA: Diagnosis not present

## 2016-08-14 DIAGNOSIS — Z9181 History of falling: Secondary | ICD-10-CM | POA: Diagnosis not present

## 2016-08-14 DIAGNOSIS — W19XXXD Unspecified fall, subsequent encounter: Secondary | ICD-10-CM | POA: Diagnosis not present

## 2016-08-14 DIAGNOSIS — N390 Urinary tract infection, site not specified: Secondary | ICD-10-CM | POA: Diagnosis not present

## 2016-08-14 DIAGNOSIS — I10 Essential (primary) hypertension: Secondary | ICD-10-CM | POA: Diagnosis not present

## 2016-08-14 DIAGNOSIS — S51802D Unspecified open wound of left forearm, subsequent encounter: Secondary | ICD-10-CM | POA: Diagnosis not present

## 2016-08-14 DIAGNOSIS — R296 Repeated falls: Secondary | ICD-10-CM | POA: Diagnosis not present

## 2016-08-18 DIAGNOSIS — S51802D Unspecified open wound of left forearm, subsequent encounter: Secondary | ICD-10-CM | POA: Diagnosis not present

## 2016-08-18 DIAGNOSIS — R296 Repeated falls: Secondary | ICD-10-CM | POA: Diagnosis not present

## 2016-08-18 DIAGNOSIS — N390 Urinary tract infection, site not specified: Secondary | ICD-10-CM | POA: Diagnosis not present

## 2016-08-18 DIAGNOSIS — I10 Essential (primary) hypertension: Secondary | ICD-10-CM | POA: Diagnosis not present

## 2016-08-18 DIAGNOSIS — W19XXXD Unspecified fall, subsequent encounter: Secondary | ICD-10-CM | POA: Diagnosis not present

## 2016-08-18 DIAGNOSIS — Z9181 History of falling: Secondary | ICD-10-CM | POA: Diagnosis not present

## 2016-08-19 DIAGNOSIS — Z9181 History of falling: Secondary | ICD-10-CM | POA: Diagnosis not present

## 2016-08-19 DIAGNOSIS — W19XXXD Unspecified fall, subsequent encounter: Secondary | ICD-10-CM | POA: Diagnosis not present

## 2016-08-19 DIAGNOSIS — R296 Repeated falls: Secondary | ICD-10-CM | POA: Diagnosis not present

## 2016-08-19 DIAGNOSIS — I10 Essential (primary) hypertension: Secondary | ICD-10-CM | POA: Diagnosis not present

## 2016-08-19 DIAGNOSIS — S51802D Unspecified open wound of left forearm, subsequent encounter: Secondary | ICD-10-CM | POA: Diagnosis not present

## 2016-08-19 DIAGNOSIS — N390 Urinary tract infection, site not specified: Secondary | ICD-10-CM | POA: Diagnosis not present

## 2016-08-23 DIAGNOSIS — W19XXXD Unspecified fall, subsequent encounter: Secondary | ICD-10-CM | POA: Diagnosis not present

## 2016-08-23 DIAGNOSIS — Z9181 History of falling: Secondary | ICD-10-CM | POA: Diagnosis not present

## 2016-08-23 DIAGNOSIS — I10 Essential (primary) hypertension: Secondary | ICD-10-CM | POA: Diagnosis not present

## 2016-08-23 DIAGNOSIS — S51802D Unspecified open wound of left forearm, subsequent encounter: Secondary | ICD-10-CM | POA: Diagnosis not present

## 2016-08-23 DIAGNOSIS — N390 Urinary tract infection, site not specified: Secondary | ICD-10-CM | POA: Diagnosis not present

## 2016-08-23 DIAGNOSIS — R296 Repeated falls: Secondary | ICD-10-CM | POA: Diagnosis not present

## 2016-08-25 DIAGNOSIS — W19XXXD Unspecified fall, subsequent encounter: Secondary | ICD-10-CM | POA: Diagnosis not present

## 2016-08-25 DIAGNOSIS — Z9181 History of falling: Secondary | ICD-10-CM | POA: Diagnosis not present

## 2016-08-25 DIAGNOSIS — R296 Repeated falls: Secondary | ICD-10-CM | POA: Diagnosis not present

## 2016-08-25 DIAGNOSIS — S51802D Unspecified open wound of left forearm, subsequent encounter: Secondary | ICD-10-CM | POA: Diagnosis not present

## 2016-08-25 DIAGNOSIS — N390 Urinary tract infection, site not specified: Secondary | ICD-10-CM | POA: Diagnosis not present

## 2016-08-25 DIAGNOSIS — I10 Essential (primary) hypertension: Secondary | ICD-10-CM | POA: Diagnosis not present

## 2016-08-29 DIAGNOSIS — R296 Repeated falls: Secondary | ICD-10-CM | POA: Diagnosis not present

## 2016-08-29 DIAGNOSIS — S51802D Unspecified open wound of left forearm, subsequent encounter: Secondary | ICD-10-CM | POA: Diagnosis not present

## 2016-08-29 DIAGNOSIS — N390 Urinary tract infection, site not specified: Secondary | ICD-10-CM | POA: Diagnosis not present

## 2016-08-29 DIAGNOSIS — I10 Essential (primary) hypertension: Secondary | ICD-10-CM | POA: Diagnosis not present

## 2016-08-29 DIAGNOSIS — Z9181 History of falling: Secondary | ICD-10-CM | POA: Diagnosis not present

## 2016-08-29 DIAGNOSIS — W19XXXD Unspecified fall, subsequent encounter: Secondary | ICD-10-CM | POA: Diagnosis not present

## 2016-08-30 DIAGNOSIS — F411 Generalized anxiety disorder: Secondary | ICD-10-CM | POA: Diagnosis not present

## 2016-08-30 DIAGNOSIS — H35033 Hypertensive retinopathy, bilateral: Secondary | ICD-10-CM | POA: Diagnosis not present

## 2016-08-30 DIAGNOSIS — R5383 Other fatigue: Secondary | ICD-10-CM | POA: Diagnosis not present

## 2016-08-30 DIAGNOSIS — H43813 Vitreous degeneration, bilateral: Secondary | ICD-10-CM | POA: Diagnosis not present

## 2016-08-30 DIAGNOSIS — Z961 Presence of intraocular lens: Secondary | ICD-10-CM | POA: Diagnosis not present

## 2016-08-30 DIAGNOSIS — H353131 Nonexudative age-related macular degeneration, bilateral, early dry stage: Secondary | ICD-10-CM | POA: Diagnosis not present

## 2016-08-30 DIAGNOSIS — I10 Essential (primary) hypertension: Secondary | ICD-10-CM | POA: Diagnosis not present

## 2016-08-30 DIAGNOSIS — M19011 Primary osteoarthritis, right shoulder: Secondary | ICD-10-CM | POA: Diagnosis not present

## 2016-08-30 DIAGNOSIS — H18413 Arcus senilis, bilateral: Secondary | ICD-10-CM | POA: Diagnosis not present

## 2016-08-31 DIAGNOSIS — Z9181 History of falling: Secondary | ICD-10-CM | POA: Diagnosis not present

## 2016-08-31 DIAGNOSIS — S51802D Unspecified open wound of left forearm, subsequent encounter: Secondary | ICD-10-CM | POA: Diagnosis not present

## 2016-08-31 DIAGNOSIS — R296 Repeated falls: Secondary | ICD-10-CM | POA: Diagnosis not present

## 2016-08-31 DIAGNOSIS — W19XXXD Unspecified fall, subsequent encounter: Secondary | ICD-10-CM | POA: Diagnosis not present

## 2016-08-31 DIAGNOSIS — I10 Essential (primary) hypertension: Secondary | ICD-10-CM | POA: Diagnosis not present

## 2016-08-31 DIAGNOSIS — N390 Urinary tract infection, site not specified: Secondary | ICD-10-CM | POA: Diagnosis not present

## 2016-09-07 DIAGNOSIS — R296 Repeated falls: Secondary | ICD-10-CM | POA: Diagnosis not present

## 2016-09-07 DIAGNOSIS — I10 Essential (primary) hypertension: Secondary | ICD-10-CM | POA: Diagnosis not present

## 2016-09-07 DIAGNOSIS — S51802D Unspecified open wound of left forearm, subsequent encounter: Secondary | ICD-10-CM | POA: Diagnosis not present

## 2016-09-07 DIAGNOSIS — N390 Urinary tract infection, site not specified: Secondary | ICD-10-CM | POA: Diagnosis not present

## 2016-10-11 DIAGNOSIS — M2040 Other hammer toe(s) (acquired), unspecified foot: Secondary | ICD-10-CM | POA: Diagnosis not present

## 2016-10-11 DIAGNOSIS — R600 Localized edema: Secondary | ICD-10-CM | POA: Diagnosis not present

## 2016-10-11 DIAGNOSIS — L853 Xerosis cutis: Secondary | ICD-10-CM | POA: Diagnosis not present

## 2016-11-07 DIAGNOSIS — Z79899 Other long term (current) drug therapy: Secondary | ICD-10-CM | POA: Diagnosis not present

## 2016-11-07 DIAGNOSIS — D649 Anemia, unspecified: Secondary | ICD-10-CM | POA: Diagnosis not present

## 2016-11-07 DIAGNOSIS — I10 Essential (primary) hypertension: Secondary | ICD-10-CM | POA: Diagnosis not present

## 2016-11-09 DIAGNOSIS — D649 Anemia, unspecified: Secondary | ICD-10-CM | POA: Diagnosis not present

## 2016-11-09 DIAGNOSIS — I10 Essential (primary) hypertension: Secondary | ICD-10-CM | POA: Diagnosis not present

## 2016-11-09 DIAGNOSIS — M19011 Primary osteoarthritis, right shoulder: Secondary | ICD-10-CM | POA: Diagnosis not present

## 2016-11-09 DIAGNOSIS — F411 Generalized anxiety disorder: Secondary | ICD-10-CM | POA: Diagnosis not present

## 2016-12-29 DIAGNOSIS — I10 Essential (primary) hypertension: Secondary | ICD-10-CM | POA: Diagnosis not present

## 2017-01-17 DIAGNOSIS — M2040 Other hammer toe(s) (acquired), unspecified foot: Secondary | ICD-10-CM | POA: Diagnosis not present

## 2017-01-17 DIAGNOSIS — R600 Localized edema: Secondary | ICD-10-CM | POA: Diagnosis not present

## 2017-01-17 DIAGNOSIS — L853 Xerosis cutis: Secondary | ICD-10-CM | POA: Diagnosis not present

## 2017-02-05 DIAGNOSIS — I8391 Asymptomatic varicose veins of right lower extremity: Secondary | ICD-10-CM | POA: Diagnosis not present

## 2017-02-05 DIAGNOSIS — Z6822 Body mass index (BMI) 22.0-22.9, adult: Secondary | ICD-10-CM | POA: Diagnosis not present

## 2017-02-05 DIAGNOSIS — I10 Essential (primary) hypertension: Secondary | ICD-10-CM | POA: Diagnosis not present

## 2017-03-09 DIAGNOSIS — I10 Essential (primary) hypertension: Secondary | ICD-10-CM | POA: Diagnosis not present

## 2017-03-09 DIAGNOSIS — Q248 Other specified congenital malformations of heart: Secondary | ICD-10-CM | POA: Diagnosis not present

## 2017-03-28 DIAGNOSIS — S8011XA Contusion of right lower leg, initial encounter: Secondary | ICD-10-CM | POA: Diagnosis not present

## 2017-04-16 ENCOUNTER — Ambulatory Visit
Admission: RE | Admit: 2017-04-16 | Discharge: 2017-04-16 | Disposition: A | Discharge status: Home / Self Care | Payer: Medicare Other | Source: Ambulatory Visit | Attending: Family Medicine | Admitting: Family Medicine

## 2017-04-16 ENCOUNTER — Other Ambulatory Visit: Payer: Self-pay | Admitting: Family Medicine

## 2017-04-16 DIAGNOSIS — S8011XS Contusion of right lower leg, sequela: Secondary | ICD-10-CM | POA: Diagnosis not present

## 2017-04-16 DIAGNOSIS — M7989 Other specified soft tissue disorders: Secondary | ICD-10-CM | POA: Diagnosis not present

## 2017-04-16 DIAGNOSIS — M79604 Pain in right leg: Secondary | ICD-10-CM

## 2017-04-16 DIAGNOSIS — L0390 Cellulitis, unspecified: Secondary | ICD-10-CM | POA: Diagnosis not present

## 2017-04-16 DIAGNOSIS — M7981 Nontraumatic hematoma of soft tissue: Secondary | ICD-10-CM | POA: Diagnosis not present

## 2017-04-18 DIAGNOSIS — S8011XS Contusion of right lower leg, sequela: Secondary | ICD-10-CM | POA: Diagnosis not present

## 2017-04-18 DIAGNOSIS — L0390 Cellulitis, unspecified: Secondary | ICD-10-CM | POA: Diagnosis not present

## 2017-04-25 DIAGNOSIS — R600 Localized edema: Secondary | ICD-10-CM | POA: Diagnosis not present

## 2017-04-25 DIAGNOSIS — M2010 Hallux valgus (acquired), unspecified foot: Secondary | ICD-10-CM | POA: Diagnosis not present

## 2017-04-25 DIAGNOSIS — I739 Peripheral vascular disease, unspecified: Secondary | ICD-10-CM | POA: Diagnosis not present

## 2017-04-27 DIAGNOSIS — L0390 Cellulitis, unspecified: Secondary | ICD-10-CM | POA: Diagnosis not present

## 2017-04-27 DIAGNOSIS — S81801S Unspecified open wound, right lower leg, sequela: Secondary | ICD-10-CM | POA: Diagnosis not present

## 2017-05-01 DIAGNOSIS — S81801S Unspecified open wound, right lower leg, sequela: Secondary | ICD-10-CM | POA: Diagnosis not present

## 2017-05-01 DIAGNOSIS — Z6822 Body mass index (BMI) 22.0-22.9, adult: Secondary | ICD-10-CM | POA: Diagnosis not present

## 2017-05-02 DIAGNOSIS — T798XXA Other early complications of trauma, initial encounter: Secondary | ICD-10-CM | POA: Diagnosis not present

## 2017-05-02 DIAGNOSIS — R413 Other amnesia: Secondary | ICD-10-CM | POA: Diagnosis not present

## 2017-05-02 DIAGNOSIS — I10 Essential (primary) hypertension: Secondary | ICD-10-CM | POA: Diagnosis not present

## 2017-05-02 DIAGNOSIS — L03115 Cellulitis of right lower limb: Secondary | ICD-10-CM | POA: Diagnosis not present

## 2017-05-02 DIAGNOSIS — S81801A Unspecified open wound, right lower leg, initial encounter: Secondary | ICD-10-CM | POA: Diagnosis not present

## 2017-05-02 DIAGNOSIS — R600 Localized edema: Secondary | ICD-10-CM | POA: Diagnosis not present

## 2017-05-02 DIAGNOSIS — M1991 Primary osteoarthritis, unspecified site: Secondary | ICD-10-CM | POA: Diagnosis not present

## 2017-05-02 DIAGNOSIS — I8391 Asymptomatic varicose veins of right lower extremity: Secondary | ICD-10-CM | POA: Diagnosis not present

## 2017-05-02 DIAGNOSIS — Z4800 Encounter for change or removal of nonsurgical wound dressing: Secondary | ICD-10-CM | POA: Diagnosis not present

## 2017-05-03 DIAGNOSIS — I8391 Asymptomatic varicose veins of right lower extremity: Secondary | ICD-10-CM | POA: Diagnosis not present

## 2017-05-03 DIAGNOSIS — R600 Localized edema: Secondary | ICD-10-CM | POA: Diagnosis not present

## 2017-05-03 DIAGNOSIS — S81801A Unspecified open wound, right lower leg, initial encounter: Secondary | ICD-10-CM | POA: Diagnosis not present

## 2017-05-03 DIAGNOSIS — T798XXA Other early complications of trauma, initial encounter: Secondary | ICD-10-CM | POA: Diagnosis not present

## 2017-05-03 DIAGNOSIS — M1991 Primary osteoarthritis, unspecified site: Secondary | ICD-10-CM | POA: Diagnosis not present

## 2017-05-03 DIAGNOSIS — L03115 Cellulitis of right lower limb: Secondary | ICD-10-CM | POA: Diagnosis not present

## 2017-05-04 DIAGNOSIS — I10 Essential (primary) hypertension: Secondary | ICD-10-CM | POA: Diagnosis not present

## 2017-05-04 DIAGNOSIS — Z6822 Body mass index (BMI) 22.0-22.9, adult: Secondary | ICD-10-CM | POA: Diagnosis not present

## 2017-05-04 DIAGNOSIS — S81801S Unspecified open wound, right lower leg, sequela: Secondary | ICD-10-CM | POA: Diagnosis not present

## 2017-05-08 DIAGNOSIS — L03115 Cellulitis of right lower limb: Secondary | ICD-10-CM | POA: Diagnosis not present

## 2017-05-08 DIAGNOSIS — T798XXA Other early complications of trauma, initial encounter: Secondary | ICD-10-CM | POA: Diagnosis not present

## 2017-05-08 DIAGNOSIS — I8391 Asymptomatic varicose veins of right lower extremity: Secondary | ICD-10-CM | POA: Diagnosis not present

## 2017-05-08 DIAGNOSIS — Z79899 Other long term (current) drug therapy: Secondary | ICD-10-CM | POA: Diagnosis not present

## 2017-05-08 DIAGNOSIS — M1991 Primary osteoarthritis, unspecified site: Secondary | ICD-10-CM | POA: Diagnosis not present

## 2017-05-08 DIAGNOSIS — S81801A Unspecified open wound, right lower leg, initial encounter: Secondary | ICD-10-CM | POA: Diagnosis not present

## 2017-05-08 DIAGNOSIS — R600 Localized edema: Secondary | ICD-10-CM | POA: Diagnosis not present

## 2017-05-08 DIAGNOSIS — D649 Anemia, unspecified: Secondary | ICD-10-CM | POA: Diagnosis not present

## 2017-05-08 DIAGNOSIS — I10 Essential (primary) hypertension: Secondary | ICD-10-CM | POA: Diagnosis not present

## 2017-05-10 DIAGNOSIS — D649 Anemia, unspecified: Secondary | ICD-10-CM | POA: Diagnosis not present

## 2017-05-10 DIAGNOSIS — Z23 Encounter for immunization: Secondary | ICD-10-CM | POA: Diagnosis not present

## 2017-05-10 DIAGNOSIS — I10 Essential (primary) hypertension: Secondary | ICD-10-CM | POA: Diagnosis not present

## 2017-05-10 DIAGNOSIS — E876 Hypokalemia: Secondary | ICD-10-CM | POA: Diagnosis not present

## 2017-05-10 DIAGNOSIS — Z79899 Other long term (current) drug therapy: Secondary | ICD-10-CM | POA: Diagnosis not present

## 2017-05-10 DIAGNOSIS — S81801S Unspecified open wound, right lower leg, sequela: Secondary | ICD-10-CM | POA: Diagnosis not present

## 2017-05-10 DIAGNOSIS — Z0000 Encounter for general adult medical examination without abnormal findings: Secondary | ICD-10-CM | POA: Diagnosis not present

## 2017-05-10 DIAGNOSIS — Z6821 Body mass index (BMI) 21.0-21.9, adult: Secondary | ICD-10-CM | POA: Diagnosis not present

## 2017-05-11 DIAGNOSIS — L03115 Cellulitis of right lower limb: Secondary | ICD-10-CM | POA: Diagnosis not present

## 2017-05-11 DIAGNOSIS — T798XXA Other early complications of trauma, initial encounter: Secondary | ICD-10-CM | POA: Diagnosis not present

## 2017-05-11 DIAGNOSIS — S81801A Unspecified open wound, right lower leg, initial encounter: Secondary | ICD-10-CM | POA: Diagnosis not present

## 2017-05-11 DIAGNOSIS — I8391 Asymptomatic varicose veins of right lower extremity: Secondary | ICD-10-CM | POA: Diagnosis not present

## 2017-05-11 DIAGNOSIS — R600 Localized edema: Secondary | ICD-10-CM | POA: Diagnosis not present

## 2017-05-11 DIAGNOSIS — M1991 Primary osteoarthritis, unspecified site: Secondary | ICD-10-CM | POA: Diagnosis not present

## 2017-05-14 ENCOUNTER — Encounter (HOSPITAL_BASED_OUTPATIENT_CLINIC_OR_DEPARTMENT_OTHER): Payer: Medicare Other | Attending: Internal Medicine

## 2017-05-14 DIAGNOSIS — I70232 Atherosclerosis of native arteries of right leg with ulceration of calf: Secondary | ICD-10-CM | POA: Insufficient documentation

## 2017-05-14 DIAGNOSIS — L97213 Non-pressure chronic ulcer of right calf with necrosis of muscle: Secondary | ICD-10-CM | POA: Insufficient documentation

## 2017-05-14 DIAGNOSIS — I10 Essential (primary) hypertension: Secondary | ICD-10-CM | POA: Insufficient documentation

## 2017-05-14 DIAGNOSIS — S81801A Unspecified open wound, right lower leg, initial encounter: Secondary | ICD-10-CM | POA: Diagnosis not present

## 2017-05-16 DIAGNOSIS — R600 Localized edema: Secondary | ICD-10-CM | POA: Diagnosis not present

## 2017-05-16 DIAGNOSIS — L03115 Cellulitis of right lower limb: Secondary | ICD-10-CM | POA: Diagnosis not present

## 2017-05-16 DIAGNOSIS — S81801A Unspecified open wound, right lower leg, initial encounter: Secondary | ICD-10-CM | POA: Diagnosis not present

## 2017-05-16 DIAGNOSIS — I8391 Asymptomatic varicose veins of right lower extremity: Secondary | ICD-10-CM | POA: Diagnosis not present

## 2017-05-16 DIAGNOSIS — M1991 Primary osteoarthritis, unspecified site: Secondary | ICD-10-CM | POA: Diagnosis not present

## 2017-05-16 DIAGNOSIS — T798XXA Other early complications of trauma, initial encounter: Secondary | ICD-10-CM | POA: Diagnosis not present

## 2017-05-18 DIAGNOSIS — R600 Localized edema: Secondary | ICD-10-CM | POA: Diagnosis not present

## 2017-05-18 DIAGNOSIS — S81801A Unspecified open wound, right lower leg, initial encounter: Secondary | ICD-10-CM | POA: Diagnosis not present

## 2017-05-18 DIAGNOSIS — T798XXA Other early complications of trauma, initial encounter: Secondary | ICD-10-CM | POA: Diagnosis not present

## 2017-05-18 DIAGNOSIS — M1991 Primary osteoarthritis, unspecified site: Secondary | ICD-10-CM | POA: Diagnosis not present

## 2017-05-18 DIAGNOSIS — L03115 Cellulitis of right lower limb: Secondary | ICD-10-CM | POA: Diagnosis not present

## 2017-05-18 DIAGNOSIS — I8391 Asymptomatic varicose veins of right lower extremity: Secondary | ICD-10-CM | POA: Diagnosis not present

## 2017-05-21 DIAGNOSIS — L97213 Non-pressure chronic ulcer of right calf with necrosis of muscle: Secondary | ICD-10-CM | POA: Diagnosis not present

## 2017-05-21 DIAGNOSIS — I10 Essential (primary) hypertension: Secondary | ICD-10-CM | POA: Diagnosis not present

## 2017-05-21 DIAGNOSIS — N19 Unspecified kidney failure: Secondary | ICD-10-CM | POA: Diagnosis not present

## 2017-05-21 DIAGNOSIS — Z79899 Other long term (current) drug therapy: Secondary | ICD-10-CM | POA: Diagnosis not present

## 2017-05-21 DIAGNOSIS — I70232 Atherosclerosis of native arteries of right leg with ulceration of calf: Secondary | ICD-10-CM | POA: Diagnosis not present

## 2017-05-21 DIAGNOSIS — S81801A Unspecified open wound, right lower leg, initial encounter: Secondary | ICD-10-CM | POA: Diagnosis not present

## 2017-05-23 DIAGNOSIS — T798XXA Other early complications of trauma, initial encounter: Secondary | ICD-10-CM | POA: Diagnosis not present

## 2017-05-23 DIAGNOSIS — M1991 Primary osteoarthritis, unspecified site: Secondary | ICD-10-CM | POA: Diagnosis not present

## 2017-05-23 DIAGNOSIS — S81801S Unspecified open wound, right lower leg, sequela: Secondary | ICD-10-CM | POA: Diagnosis not present

## 2017-05-23 DIAGNOSIS — L03115 Cellulitis of right lower limb: Secondary | ICD-10-CM | POA: Diagnosis not present

## 2017-05-23 DIAGNOSIS — I8391 Asymptomatic varicose veins of right lower extremity: Secondary | ICD-10-CM | POA: Diagnosis not present

## 2017-05-23 DIAGNOSIS — S81801A Unspecified open wound, right lower leg, initial encounter: Secondary | ICD-10-CM | POA: Diagnosis not present

## 2017-05-23 DIAGNOSIS — Z6821 Body mass index (BMI) 21.0-21.9, adult: Secondary | ICD-10-CM | POA: Diagnosis not present

## 2017-05-23 DIAGNOSIS — I10 Essential (primary) hypertension: Secondary | ICD-10-CM | POA: Diagnosis not present

## 2017-05-23 DIAGNOSIS — R600 Localized edema: Secondary | ICD-10-CM | POA: Diagnosis not present

## 2017-05-23 DIAGNOSIS — N289 Disorder of kidney and ureter, unspecified: Secondary | ICD-10-CM | POA: Diagnosis not present

## 2017-05-24 DIAGNOSIS — L03115 Cellulitis of right lower limb: Secondary | ICD-10-CM | POA: Diagnosis not present

## 2017-05-24 DIAGNOSIS — M1991 Primary osteoarthritis, unspecified site: Secondary | ICD-10-CM | POA: Diagnosis not present

## 2017-05-24 DIAGNOSIS — T798XXA Other early complications of trauma, initial encounter: Secondary | ICD-10-CM | POA: Diagnosis not present

## 2017-05-25 DIAGNOSIS — I8391 Asymptomatic varicose veins of right lower extremity: Secondary | ICD-10-CM | POA: Diagnosis not present

## 2017-05-25 DIAGNOSIS — L03115 Cellulitis of right lower limb: Secondary | ICD-10-CM | POA: Diagnosis not present

## 2017-05-25 DIAGNOSIS — R600 Localized edema: Secondary | ICD-10-CM | POA: Diagnosis not present

## 2017-05-25 DIAGNOSIS — T798XXA Other early complications of trauma, initial encounter: Secondary | ICD-10-CM | POA: Diagnosis not present

## 2017-05-25 DIAGNOSIS — M1991 Primary osteoarthritis, unspecified site: Secondary | ICD-10-CM | POA: Diagnosis not present

## 2017-05-25 DIAGNOSIS — S81801A Unspecified open wound, right lower leg, initial encounter: Secondary | ICD-10-CM | POA: Diagnosis not present

## 2017-05-28 DIAGNOSIS — I70232 Atherosclerosis of native arteries of right leg with ulceration of calf: Secondary | ICD-10-CM | POA: Diagnosis not present

## 2017-05-28 DIAGNOSIS — S81801A Unspecified open wound, right lower leg, initial encounter: Secondary | ICD-10-CM | POA: Diagnosis not present

## 2017-05-28 DIAGNOSIS — I10 Essential (primary) hypertension: Secondary | ICD-10-CM | POA: Diagnosis not present

## 2017-05-28 DIAGNOSIS — L97213 Non-pressure chronic ulcer of right calf with necrosis of muscle: Secondary | ICD-10-CM | POA: Diagnosis not present

## 2017-05-29 DIAGNOSIS — M1991 Primary osteoarthritis, unspecified site: Secondary | ICD-10-CM | POA: Diagnosis not present

## 2017-05-29 DIAGNOSIS — R600 Localized edema: Secondary | ICD-10-CM | POA: Diagnosis not present

## 2017-05-29 DIAGNOSIS — S81801A Unspecified open wound, right lower leg, initial encounter: Secondary | ICD-10-CM | POA: Diagnosis not present

## 2017-05-29 DIAGNOSIS — T798XXA Other early complications of trauma, initial encounter: Secondary | ICD-10-CM | POA: Diagnosis not present

## 2017-05-29 DIAGNOSIS — I8391 Asymptomatic varicose veins of right lower extremity: Secondary | ICD-10-CM | POA: Diagnosis not present

## 2017-05-29 DIAGNOSIS — L03115 Cellulitis of right lower limb: Secondary | ICD-10-CM | POA: Diagnosis not present

## 2017-05-30 DIAGNOSIS — T798XXA Other early complications of trauma, initial encounter: Secondary | ICD-10-CM | POA: Diagnosis not present

## 2017-05-30 DIAGNOSIS — M1991 Primary osteoarthritis, unspecified site: Secondary | ICD-10-CM | POA: Diagnosis not present

## 2017-05-30 DIAGNOSIS — S81801A Unspecified open wound, right lower leg, initial encounter: Secondary | ICD-10-CM | POA: Diagnosis not present

## 2017-05-30 DIAGNOSIS — I8391 Asymptomatic varicose veins of right lower extremity: Secondary | ICD-10-CM | POA: Diagnosis not present

## 2017-05-30 DIAGNOSIS — R600 Localized edema: Secondary | ICD-10-CM | POA: Diagnosis not present

## 2017-05-30 DIAGNOSIS — L03115 Cellulitis of right lower limb: Secondary | ICD-10-CM | POA: Diagnosis not present

## 2017-06-01 DIAGNOSIS — M1991 Primary osteoarthritis, unspecified site: Secondary | ICD-10-CM | POA: Diagnosis not present

## 2017-06-01 DIAGNOSIS — I8391 Asymptomatic varicose veins of right lower extremity: Secondary | ICD-10-CM | POA: Diagnosis not present

## 2017-06-01 DIAGNOSIS — R600 Localized edema: Secondary | ICD-10-CM | POA: Diagnosis not present

## 2017-06-01 DIAGNOSIS — L03115 Cellulitis of right lower limb: Secondary | ICD-10-CM | POA: Diagnosis not present

## 2017-06-01 DIAGNOSIS — S81801A Unspecified open wound, right lower leg, initial encounter: Secondary | ICD-10-CM | POA: Diagnosis not present

## 2017-06-01 DIAGNOSIS — T798XXA Other early complications of trauma, initial encounter: Secondary | ICD-10-CM | POA: Diagnosis not present

## 2017-06-04 ENCOUNTER — Encounter (HOSPITAL_BASED_OUTPATIENT_CLINIC_OR_DEPARTMENT_OTHER): Payer: Medicare Other | Attending: Internal Medicine

## 2017-06-04 DIAGNOSIS — L97112 Non-pressure chronic ulcer of right thigh with fat layer exposed: Secondary | ICD-10-CM | POA: Insufficient documentation

## 2017-06-04 DIAGNOSIS — I70238 Atherosclerosis of native arteries of right leg with ulceration of other part of lower right leg: Secondary | ICD-10-CM | POA: Diagnosis not present

## 2017-06-04 DIAGNOSIS — I10 Essential (primary) hypertension: Secondary | ICD-10-CM | POA: Insufficient documentation

## 2017-06-04 DIAGNOSIS — S81801A Unspecified open wound, right lower leg, initial encounter: Secondary | ICD-10-CM | POA: Diagnosis not present

## 2017-06-06 DIAGNOSIS — T798XXA Other early complications of trauma, initial encounter: Secondary | ICD-10-CM | POA: Diagnosis not present

## 2017-06-06 DIAGNOSIS — M1991 Primary osteoarthritis, unspecified site: Secondary | ICD-10-CM | POA: Diagnosis not present

## 2017-06-06 DIAGNOSIS — I8391 Asymptomatic varicose veins of right lower extremity: Secondary | ICD-10-CM | POA: Diagnosis not present

## 2017-06-06 DIAGNOSIS — L03115 Cellulitis of right lower limb: Secondary | ICD-10-CM | POA: Diagnosis not present

## 2017-06-06 DIAGNOSIS — R600 Localized edema: Secondary | ICD-10-CM | POA: Diagnosis not present

## 2017-06-06 DIAGNOSIS — S81801A Unspecified open wound, right lower leg, initial encounter: Secondary | ICD-10-CM | POA: Diagnosis not present

## 2017-06-13 DIAGNOSIS — I8391 Asymptomatic varicose veins of right lower extremity: Secondary | ICD-10-CM | POA: Diagnosis not present

## 2017-06-13 DIAGNOSIS — R600 Localized edema: Secondary | ICD-10-CM | POA: Diagnosis not present

## 2017-06-13 DIAGNOSIS — M1991 Primary osteoarthritis, unspecified site: Secondary | ICD-10-CM | POA: Diagnosis not present

## 2017-06-13 DIAGNOSIS — S81801A Unspecified open wound, right lower leg, initial encounter: Secondary | ICD-10-CM | POA: Diagnosis not present

## 2017-06-13 DIAGNOSIS — L03115 Cellulitis of right lower limb: Secondary | ICD-10-CM | POA: Diagnosis not present

## 2017-06-13 DIAGNOSIS — T798XXA Other early complications of trauma, initial encounter: Secondary | ICD-10-CM | POA: Diagnosis not present

## 2017-08-20 DIAGNOSIS — Z79899 Other long term (current) drug therapy: Secondary | ICD-10-CM | POA: Diagnosis not present

## 2017-08-20 DIAGNOSIS — I10 Essential (primary) hypertension: Secondary | ICD-10-CM | POA: Diagnosis not present

## 2017-08-20 DIAGNOSIS — N289 Disorder of kidney and ureter, unspecified: Secondary | ICD-10-CM | POA: Diagnosis not present

## 2017-08-20 DIAGNOSIS — D649 Anemia, unspecified: Secondary | ICD-10-CM | POA: Diagnosis not present

## 2017-08-20 DIAGNOSIS — E538 Deficiency of other specified B group vitamins: Secondary | ICD-10-CM | POA: Diagnosis not present

## 2017-08-22 DIAGNOSIS — N289 Disorder of kidney and ureter, unspecified: Secondary | ICD-10-CM | POA: Diagnosis not present

## 2017-08-22 DIAGNOSIS — R609 Edema, unspecified: Secondary | ICD-10-CM | POA: Diagnosis not present

## 2017-08-22 DIAGNOSIS — I10 Essential (primary) hypertension: Secondary | ICD-10-CM | POA: Diagnosis not present

## 2017-08-22 DIAGNOSIS — D649 Anemia, unspecified: Secondary | ICD-10-CM | POA: Diagnosis not present

## 2017-09-17 ENCOUNTER — Inpatient Hospital Stay (HOSPITAL_COMMUNITY)
Admission: EM | Admit: 2017-09-17 | Discharge: 2017-09-24 | DRG: 177 | Disposition: A | Discharge status: Nursing Home (Includes ICF) | Payer: Medicare Other | Attending: Internal Medicine | Admitting: Internal Medicine

## 2017-09-17 ENCOUNTER — Other Ambulatory Visit: Payer: Self-pay

## 2017-09-17 ENCOUNTER — Inpatient Hospital Stay (HOSPITAL_COMMUNITY): Payer: Medicare Other

## 2017-09-17 ENCOUNTER — Emergency Department (HOSPITAL_COMMUNITY): Payer: Medicare Other

## 2017-09-17 ENCOUNTER — Encounter (HOSPITAL_COMMUNITY): Payer: Self-pay

## 2017-09-17 DIAGNOSIS — R8290 Unspecified abnormal findings in urine: Secondary | ICD-10-CM | POA: Diagnosis present

## 2017-09-17 DIAGNOSIS — R0689 Other abnormalities of breathing: Secondary | ICD-10-CM | POA: Diagnosis not present

## 2017-09-17 DIAGNOSIS — R079 Chest pain, unspecified: Secondary | ICD-10-CM | POA: Diagnosis not present

## 2017-09-17 DIAGNOSIS — Z79899 Other long term (current) drug therapy: Secondary | ICD-10-CM

## 2017-09-17 DIAGNOSIS — R1110 Vomiting, unspecified: Secondary | ICD-10-CM | POA: Diagnosis not present

## 2017-09-17 DIAGNOSIS — J206 Acute bronchitis due to rhinovirus: Secondary | ICD-10-CM | POA: Diagnosis present

## 2017-09-17 DIAGNOSIS — J189 Pneumonia, unspecified organism: Secondary | ICD-10-CM | POA: Diagnosis not present

## 2017-09-17 DIAGNOSIS — R0902 Hypoxemia: Secondary | ICD-10-CM | POA: Diagnosis not present

## 2017-09-17 DIAGNOSIS — I130 Hypertensive heart and chronic kidney disease with heart failure and stage 1 through stage 4 chronic kidney disease, or unspecified chronic kidney disease: Secondary | ICD-10-CM | POA: Diagnosis present

## 2017-09-17 DIAGNOSIS — I422 Other hypertrophic cardiomyopathy: Secondary | ICD-10-CM | POA: Diagnosis present

## 2017-09-17 DIAGNOSIS — R55 Syncope and collapse: Secondary | ICD-10-CM

## 2017-09-17 DIAGNOSIS — M810 Age-related osteoporosis without current pathological fracture: Secondary | ICD-10-CM | POA: Diagnosis present

## 2017-09-17 DIAGNOSIS — Z66 Do not resuscitate: Secondary | ICD-10-CM | POA: Diagnosis not present

## 2017-09-17 DIAGNOSIS — R1111 Vomiting without nausea: Secondary | ICD-10-CM

## 2017-09-17 DIAGNOSIS — R918 Other nonspecific abnormal finding of lung field: Secondary | ICD-10-CM | POA: Diagnosis not present

## 2017-09-17 DIAGNOSIS — I5033 Acute on chronic diastolic (congestive) heart failure: Secondary | ICD-10-CM | POA: Diagnosis present

## 2017-09-17 DIAGNOSIS — J690 Pneumonitis due to inhalation of food and vomit: Principal | ICD-10-CM | POA: Diagnosis present

## 2017-09-17 DIAGNOSIS — R05 Cough: Secondary | ICD-10-CM | POA: Diagnosis not present

## 2017-09-17 DIAGNOSIS — E872 Acidosis: Secondary | ICD-10-CM | POA: Diagnosis present

## 2017-09-17 DIAGNOSIS — D649 Anemia, unspecified: Secondary | ICD-10-CM | POA: Diagnosis present

## 2017-09-17 DIAGNOSIS — I700 Atherosclerosis of aorta: Secondary | ICD-10-CM | POA: Diagnosis not present

## 2017-09-17 DIAGNOSIS — J9601 Acute respiratory failure with hypoxia: Secondary | ICD-10-CM | POA: Diagnosis present

## 2017-09-17 DIAGNOSIS — I629 Nontraumatic intracranial hemorrhage, unspecified: Secondary | ICD-10-CM | POA: Diagnosis not present

## 2017-09-17 DIAGNOSIS — R059 Cough, unspecified: Secondary | ICD-10-CM

## 2017-09-17 DIAGNOSIS — I10 Essential (primary) hypertension: Secondary | ICD-10-CM | POA: Diagnosis not present

## 2017-09-17 DIAGNOSIS — N179 Acute kidney failure, unspecified: Secondary | ICD-10-CM | POA: Diagnosis not present

## 2017-09-17 DIAGNOSIS — N183 Chronic kidney disease, stage 3 (moderate): Secondary | ICD-10-CM | POA: Diagnosis not present

## 2017-09-17 DIAGNOSIS — R404 Transient alteration of awareness: Secondary | ICD-10-CM | POA: Diagnosis not present

## 2017-09-17 DIAGNOSIS — Z7982 Long term (current) use of aspirin: Secondary | ICD-10-CM

## 2017-09-17 DIAGNOSIS — I5030 Unspecified diastolic (congestive) heart failure: Secondary | ICD-10-CM | POA: Diagnosis not present

## 2017-09-17 DIAGNOSIS — G9341 Metabolic encephalopathy: Secondary | ICD-10-CM | POA: Insufficient documentation

## 2017-09-17 HISTORY — DX: Anemia, unspecified: D64.9

## 2017-09-17 LAB — BASIC METABOLIC PANEL
ANION GAP: 11 (ref 5–15)
BUN: 23 mg/dL — ABNORMAL HIGH (ref 6–20)
CALCIUM: 9.3 mg/dL (ref 8.9–10.3)
CO2: 21 mmol/L — ABNORMAL LOW (ref 22–32)
Chloride: 104 mmol/L (ref 101–111)
Creatinine, Ser: 1.08 mg/dL — ABNORMAL HIGH (ref 0.44–1.00)
GFR, EST AFRICAN AMERICAN: 50 mL/min — AB (ref >60)
GFR, EST NON AFRICAN AMERICAN: 43 mL/min — AB (ref >60)
Glucose, Bld: 107 mg/dL — ABNORMAL HIGH (ref 65–99)
POTASSIUM: 3.7 mmol/L (ref 3.5–5.1)
Sodium: 136 mmol/L (ref 135–145)

## 2017-09-17 LAB — PROCALCITONIN: Procalcitonin: 0.26 ng/mL

## 2017-09-17 LAB — BRAIN NATRIURETIC PEPTIDE: B Natriuretic Peptide: 1223.5 pg/mL — ABNORMAL HIGH (ref 0.0–100.0)

## 2017-09-17 LAB — URINALYSIS, ROUTINE W REFLEX MICROSCOPIC
Bilirubin Urine: NEGATIVE
Glucose, UA: NEGATIVE mg/dL
Ketones, ur: NEGATIVE mg/dL
NITRITE: NEGATIVE
PROTEIN: NEGATIVE mg/dL
SPECIFIC GRAVITY, URINE: 1.011 (ref 1.005–1.030)
pH: 6 (ref 5.0–8.0)

## 2017-09-17 LAB — CBC
HEMATOCRIT: 33.1 % — AB (ref 36.0–46.0)
HEMOGLOBIN: 10.6 g/dL — AB (ref 12.0–15.0)
MCH: 29.8 pg (ref 26.0–34.0)
MCHC: 32 g/dL (ref 30.0–36.0)
MCV: 93 fL (ref 78.0–100.0)
Platelets: 196 10*3/uL (ref 150–400)
RBC: 3.56 MIL/uL — AB (ref 3.87–5.11)
RDW: 13.8 % (ref 11.5–15.5)
WBC: 7.6 10*3/uL (ref 4.0–10.5)

## 2017-09-17 LAB — C-REACTIVE PROTEIN: CRP: 5.8 mg/dL — ABNORMAL HIGH (ref <1.0)

## 2017-09-17 LAB — LACTIC ACID, PLASMA: Lactic Acid, Venous: 0.7 mmol/L (ref 0.5–1.9)

## 2017-09-17 LAB — CBG MONITORING, ED: GLUCOSE-CAPILLARY: 108 mg/dL — AB (ref 65–99)

## 2017-09-17 MED ORDER — VANCOMYCIN HCL 500 MG IV SOLR: 500.0000 mg | INTRAVENOUS | Status: DC

## 2017-09-17 MED ORDER — SODIUM CHLORIDE 0.9 % IV SOLN
INTRAVENOUS | Status: DC
  Administered 2017-09-17 – 2017-09-18 (×2): via INTRAVENOUS

## 2017-09-17 MED ORDER — ONDANSETRON HCL 4 MG/2ML IJ SOLN
4.0000 mg | Freq: Once | INTRAMUSCULAR | Status: AC
  Administered 2017-09-17: 4 mg via INTRAVENOUS
  Filled 2017-09-17: qty 2

## 2017-09-17 MED ORDER — ENOXAPARIN SODIUM 40 MG/0.4ML ~~LOC~~ SOLN: 40.0000 mg | SUBCUTANEOUS | Status: DC

## 2017-09-17 MED ORDER — ASPIRIN 81 MG PO CHEW
81.0000 mg | CHEWABLE_TABLET | Freq: Every day | ORAL | Status: DC
  Administered 2017-09-18 – 2017-09-24 (×8): 81 mg via ORAL
  Filled 2017-09-17 (×8): qty 1

## 2017-09-17 MED ORDER — METOPROLOL SUCCINATE ER 50 MG PO TB24
50.0000 mg | ORAL_TABLET | Freq: Every day | ORAL | Status: DC
  Administered 2017-09-18 – 2017-09-24 (×7): 50 mg via ORAL
  Filled 2017-09-17 (×8): qty 1

## 2017-09-17 MED ORDER — ALBUTEROL SULFATE (2.5 MG/3ML) 0.083% IN NEBU
2.5000 mg | INHALATION_SOLUTION | RESPIRATORY_TRACT | Status: DC | PRN
  Administered 2017-09-19 – 2017-09-22 (×2): 2.5 mg via RESPIRATORY_TRACT
  Filled 2017-09-17 (×2): qty 3

## 2017-09-17 MED ORDER — VANCOMYCIN HCL IN DEXTROSE 1-5 GM/200ML-% IV SOLN
1000.0000 mg | Freq: Once | INTRAVENOUS | Status: DC
  Filled 2017-09-17: qty 200

## 2017-09-17 MED ORDER — DEXTROSE 5 % IV SOLN: 1.0000 g | Freq: Three times a day (TID) | INTRAVENOUS | Status: DC

## 2017-09-17 MED ORDER — METHYLPREDNISOLONE SODIUM SUCC 125 MG IJ SOLR
60.0000 mg | Freq: Two times a day (BID) | INTRAMUSCULAR | Status: DC
  Administered 2017-09-17 – 2017-09-18 (×2): 60 mg via INTRAVENOUS
  Filled 2017-09-17 (×2): qty 2

## 2017-09-17 MED ORDER — DEXTROSE 5 % IV SOLN
1.0000 g | INTRAVENOUS | Status: DC
  Administered 2017-09-17: 1 g via INTRAVENOUS
  Filled 2017-09-17: qty 1

## 2017-09-17 MED ORDER — CALCITONIN (SALMON) 200 UNIT/ACT NA SOLN
1.0000 | Freq: Every day | NASAL | Status: DC
  Administered 2017-09-17 – 2017-09-23 (×7): 1 via NASAL
  Filled 2017-09-17 (×3): qty 3.7

## 2017-09-17 MED ORDER — SODIUM CHLORIDE 0.9 % IV BOLUS (SEPSIS)
500.0000 mL | Freq: Once | INTRAVENOUS | Status: AC
  Administered 2017-09-17: 500 mL via INTRAVENOUS

## 2017-09-17 MED ORDER — KETOTIFEN FUMARATE 0.025 % OP SOLN
2.0000 [drp] | Freq: Two times a day (BID) | OPHTHALMIC | Status: DC
  Administered 2017-09-17 – 2017-09-24 (×11): 2 [drp] via OPHTHALMIC
  Filled 2017-09-17 (×2): qty 5

## 2017-09-17 MED ORDER — CALCIUM CARBONATE 1500 (600 CA) MG PO TABS
1500.0000 mg | ORAL_TABLET | Freq: Every day | ORAL | Status: DC
  Filled 2017-09-17: qty 1

## 2017-09-17 MED ORDER — DICLOFENAC SODIUM 1 % TD GEL: 2.0000 g | Freq: Four times a day (QID) | TRANSDERMAL | Status: DC | PRN

## 2017-09-17 MED ORDER — SODIUM CHLORIDE 0.9 % IV SOLN
INTRAVENOUS | Status: DC
  Administered 2017-09-17: 11:00:00 via INTRAVENOUS

## 2017-09-17 NOTE — ED Notes (Signed)
ED Provider at bedside. 

## 2017-09-17 NOTE — ED Notes (Signed)
Patient transported to X-ray 

## 2017-09-17 NOTE — ED Provider Notes (Signed)
[facility] EMERGENCY DEPARTMENT Provider Note   CSN: [id number] Arrival date & time: 09/17/17  0935     History   Chief Complaint Chief Complaint  Patient presents with  . Loss of Consciousness  . Cough    HPI Katherine Kirby is a 84 y.o. female.  She is here for an episode of syncope, which occurred this morning while eating breakfast.  No reported choking.  She reportedly slumped forward and was incontinent of bowels and urine.  She had an episode of vomiting with diarrhea, and malaise, 2 days ago treated with Kaopectate.  Apparently she was okay yesterday and earlier today prior to going to breakfast.  There is also report of coughing but it is unclear when it started.  No report of sputum production.  She lives in a nursing care facility.  She is here with her son and daughter-in-law who gives history.  Patient is unsure why she is here.  Level 5 caveat  HPI  Past Medical History:  Diagnosis Date  . Hypertension   . Pelvis fracture [facility])     Patient Active Problem List   Diagnosis Date Noted  . Compression fracture of lumbar spine, non-traumatic (HCC) 10/18/2015  . Lumbar compression fracture (HCC) 10/18/2015  . Spinal stenosis 10/18/2015  . Difficulty walking 10/18/2015  . UTI (lower urinary tract infection) 10/18/2015  . AKI (acute kidney injury) (HCC) 10/18/2015  . Hypertension 10/18/2015  . Hip pain   . Essential hypertension     History reviewed. No pertinent surgical history.  OB History    No data available       Home Medications    Prior to Admission medications   Medication Sig Start Date End Date Taking? Authorizing Provider  acetaminophen (TYLENOL) 500 MG tablet Take 1,000 mg by mouth every 8 (eight) hours as needed for moderate pain.    [provider]  aspirin 81 MG chewable tablet Chew 81 mg by mouth daily.    [provider]  bisacodyl (DULCOLAX) 5 MG EC tablet Take 2 tablets (10 mg total) by mouth daily  as needed for moderate constipation. 10/21/15   Elgergawy, Leana Roe, MD  calcitonin, salmon, (MIACALCIN/FORTICAL) 200 UNIT/ACT nasal spray Place 1 spray into alternate nostrils daily.    [provider]  calcium carbonate (OSCAL) 1500 (600 Ca) MG TABS tablet Take 1,500 mg by mouth daily.    [provider]  Cyanocobalamin (VITAMIN B 12 PO) Take 1 tablet by mouth daily.    [provider]  losartan (COZAAR) 50 MG tablet Take 50 mg by mouth daily.    [provider]  metoprolol succinate (TOPROL-XL) 25 MG 24 hr tablet Take 25 mg by mouth 3 (three) times daily.     [provider]  oxyCODONE (OXY IR/ROXICODONE) 5 MG immediate release tablet Take 1 tablet (5 mg total) by mouth every 4 (four) hours as needed for moderate pain. Patient not taking: Reported on 11/25/2015 10/21/15   Elgergawy, Leana Roe, MD    Family History No family history on file.  Social History Social History   Tobacco Use  . Smoking status: Never Smoker  . Smokeless tobacco: Never Used  Substance Use Topics  . Alcohol use: Yes    Comment: ocasional  . Drug use: No     Allergies   Patient has no known allergies.   Review of Systems Review of Systems  All other systems reviewed and are negative.    Physical Exam Updated  Vital Signs BP (!) 145/90   Pulse 71   Temp 98.3 F (36.8 C) (Oral)   Resp 18   Ht 5' (1.524 m)   Wt 49.9 kg (110 lb)   SpO2 91%   BMI 21.48 kg/m   Physical Exam  Constitutional: She appears well-developed. No distress.  Elderly, frail  HENT:  Head: Normocephalic and atraumatic.  Eyes: Conjunctivae and EOM are normal. Pupils are equal, round, and reactive to light.  Neck: Normal range of motion and phonation normal. Neck supple.  Cardiovascular: Normal rate and regular rhythm.  Pulmonary/Chest: Effort normal. No stridor. She exhibits no tenderness.  Moderate upper airway noises, without rhonchi or rales.  There is no increased work of  breathing.  Abdominal: Soft. She exhibits no distension. There is no tenderness. There is no guarding.  Musculoskeletal: Normal range of motion.  Neurological: She is alert. No cranial nerve deficit. She exhibits normal muscle tone.  Skin: Skin is warm and dry. No rash noted. No erythema. No pallor.  Psychiatric: She has a normal mood and affect. Her behavior is normal.  Nursing note and vitals reviewed.    ED Treatments / Results  Labs (all labs ordered are listed, but only abnormal results are displayed) Labs Reviewed  BASIC METABOLIC PANEL - Abnormal; Notable for the following components:      Result Value   CO2 21 (*)    Glucose, Bld 107 (*)    BUN 23 (*)    Creatinine, Ser 1.08 (*)    GFR calc non Af Amer 43 (*)    GFR calc Af Amer 50 (*)    All other components within normal limits  CBC - Abnormal; Notable for the following components:   RBC 3.56 (*)    Hemoglobin 10.6 (*)    HCT 33.1 (*)    All other components within normal limits  URINALYSIS, ROUTINE W REFLEX MICROSCOPIC - Abnormal; Notable for the following components:   APPearance HAZY (*)    Hgb urine dipstick SMALL (*)    Leukocytes, UA MODERATE (*)    Bacteria, UA FEW (*)    Squamous Epithelial / LPF 0-5 (*)    All other components within normal limits  CBG MONITORING, ED - Abnormal; Notable for the following components:   Glucose-Capillary 108 (*)    All other components within normal limits    EKG  EKG Interpretation  Date/Time:  Monday September 17 2017 09:56:35 EST Ventricular Rate:  66 PR Interval:    QRS Duration: 91 QT Interval:  430 QTC Calculation: 451 R Axis:   -16 Text Interpretation:  Sinus rhythm LAE, consider biatrial enlargement Borderline left axis deviation RSR' in V1 or V2, right VCD or RVH since last tracing no significant change Confirmed by Mancel Bale [phone]) on 09/17/2017 10:18:07 AM       Radiology Dg Chest 2 View  Result Date: 09/17/2017 CLINICAL DATA:  Cough and chest  pain. EXAM: CHEST  2 VIEW COMPARISON:  Chest x-ray dated November 25, 2015. MRI thoracic spine dated October 20, 2015. FINDINGS: The cardiomediastinal silhouette is normal in size. Atherosclerotic calcification of the aortic arch. Normal pulmonary vascularity. Elevation of the right hemidiaphragm. No focal consolidation, pleural effusion, or pneumothorax. New, age-indeterminate moderate compression deformity of T11. Unchanged mild compression deformities of T3, and the superior endplates of T4 and L1. IMPRESSION: 1. No active cardiopulmonary disease. Elevation of the right hemidiaphragm. 2. New age-indeterminate, moderate compression deformity of T11. Correlate with point tenderness. 3.  Aortic  atherosclerosis (ICD10-I70.0). Electronically Signed   By: Obie Dredge M.D.   On: 09/17/2017 10:39    Procedures Procedures (including critical care time)  Medications Ordered in ED Medications  0.9 %  sodium chloride infusion ( Intravenous Stopped 09/17/17 1337)  sodium chloride 0.9 % bolus 500 mL (0 mLs Intravenous Stopped 09/17/17 1120)  ondansetron (ZOFRAN) injection 4 mg (4 mg Intravenous Given 09/17/17 1050)     Initial Impression / Assessment and Plan / ED Course  I have reviewed the triage vital signs and the nursing notes.  Pertinent labs & imaging results that were available during my care of the patient were reviewed by me and considered in my medical decision making (see chart for details).      Patient Vitals for the past 24 hrs:  BP Temp Temp src Pulse Resp SpO2 Height Weight  09/17/17 1430 (!) 145/90 - - 71 18 91 % - -  09/17/17 1415 (!) 152/74 - - 63 14 94 % - -  09/17/17 1400 (!) 156/71 - - 68 20 94 % - -  09/17/17 1345 (!) 173/78 - - 77 19 92 % - -  09/17/17 1330 138/63 - - 71 16 92 % - -  09/17/17 1315 120/71 - - 70 19 90 % - -  09/17/17 1231 (!) 142/88 - - 74 18 91 % - -  09/17/17 1215 138/75 - - 70 19 92 % - -  09/17/17 1200 134/67 - - 71 14 95 % - -  09/17/17 1145 (!)  156/70 - - 70 16 95 % - -  09/17/17 1133 (!) 191/95 - - 73 19 93 % - -  09/17/17 1130 (!) 180/83 - - 72 16 95 % - -  09/17/17 1115 (!) 209/87 - - 73 16 95 % - -  09/17/17 1100 (!) 191/98 - - 72 (!) 22 97 % - -  09/17/17 1045 113/62 - - 65 18 94 % - -  09/17/17 1030 (!) 147/78 - - 65 17 98 % - -  09/17/17 1015 123/79 - - 62 16 95 % - -  09/17/17 1003 120/74 98.3 F (36.8 C) Oral 65 19 95 % 5' (1.524 m) 49.9 kg (110 lb)  09/17/17 1000 120/74 - - 65 17 95 % - -    2:39 PM Reevaluation with update and discussion. After initial assessment and treatment, an updated evaluation reveals she remains alert.  Since arriving here she has had gradually decreasing oxygenation initially 98%, now 91%.  She continues to have audible raspy respirations.  At this time auscultation reveals some lower scattered wheezing.  Findings discussed with patient and family members.  All questions answered. Mancel Bale   2:39 PM-Consult complete with hospitalist. Patient case explained and discussed.  He agrees to admit patient for further evaluation and treatment. Call ended at 15: 10   Final Clinical Impressions(s) / ED Diagnoses   Final diagnoses:  Syncope, unspecified syncope type  Vomiting without nausea, intractability of vomiting not specified, unspecified vomiting type  Hypoxia   Syncope, question provocation.  Patient ill about 24 hours with cough, and had an episode of vomiting today associated with syncope.  I am concerned that she has aspirated, leading to hypoxia.  She will require hospitalization for observation and possible further treatment.  Nursing Notes Reviewed/ Care Coordinated Applicable Imaging Reviewed Interpretation of Laboratory Data incorporated into ED treatment   Plan: Admit  ED Discharge Orders    None  Mancel BaleWentz, Herman Mell, MD 09/18/17 [phone]

## 2017-09-17 NOTE — ED Triage Notes (Signed)
[facility] from [facility]. Staff reports that pt was eating breakfast when she vomited and slumped over. Staff rpeorts 2 min period of AMS after event. EMS reports A&O at baseline upon arrival. EMS noted course cough and lung sounds in all fields. Family states that they had been contacted by staff r/t pt vomiting and coughing late Saturday evening. Pt reports productive cough. Pt does not remember event today and had bowel and bladder incontinence.

## 2017-09-17 NOTE — ED Notes (Signed)
Patient transported to CT 

## 2017-09-17 NOTE — ED Notes (Signed)
Heart healthy dinner tray ordered 

## 2017-09-17 NOTE — ED Notes (Signed)
Purewick device set in place.

## 2017-09-17 NOTE — Progress Notes (Signed)
Pharmacy Antibiotic Note  Katherine ShihRachel Mcquinn is a 84 y.o. female admitted on 09/17/2017 with pneumonia.  Pharmacy has been consulted for vancomycin and cefepime dosing.  Plan: Vancomycin goal trough 15-6120mcg/mL. Load with 1 gram and then initiate 500mg  IV every 24 hours.  Duration of therapy 8 days.  Adjust cefepime dosing based on CrCl~ 20-25 to 1 gram every 24 hours. Duration of therapy 8 days.  Height: 5' (152.4 cm) Weight: 110 lb (49.9 kg) IBW/kg (Calculated) : 45.5  Temp (24hrs), Avg:98.3 F (36.8 C), Min:98.3 F (36.8 C), Max:98.3 F (36.8 C)  Recent Labs  Lab 09/17/17 1007  WBC 7.6  CREATININE 1.08*    Estimated Creatinine Clearance: 23.4 mL/min (A) (by C-G formula based on SCr of 1.08 mg/dL (H)).    No Known Allergies  Antimicrobials this admission: 1/14 cefepime>> 1/14 vancomycin >>    Dose adjustments this admission: N/A  Microbiology results: 1/14 BCx: x 2 pending 1/14 Sputum: pending 1/14 Respiratory Panel PCR: pending  Thank you for allowing pharmacy to be a part of this patient's care.  Harlow AsaAmber C Ilaria Much, PharmD 09/17/2017 4:41 PM

## 2017-09-17 NOTE — ED Notes (Signed)
Clear liquid diet dinner tray ordered 

## 2017-09-17 NOTE — H&P (Signed)
History and Physical    Katherine Kirby OEV:[id number] DOB: 1922/07/30 DOA: 09/17/2017  **Will admit patient based on the expectation that the patient will need hospitalization/ hospital care that crosses at least 2 midnights  PCP: Fanny Bien, MD   Attending physician: Lorin Mercy  Patient coming from/Resides with: [facility] facility  Chief Complaint: Altered mental status ("fainted") followed by emesis  HPI: Katherine Kirby is a 84 y.o. female with medical history significant for hypertension, osteoporosis with history of prior pelvic fracture as well as spinal compression fractures, and normocytic anemia.  In talking with the patient's family this past Saturday on 1/12 patient had episode of diarrhea and emesis but improved after administration of Tylenol and Kaopectate.  On 1/13 developed congestion and coughing but cough was nonproductive and low-grade fevers.  Today while the patient was sitting at breakfast she fainted and then vomited.  Upon awakening the nurse noticed that the patient was somewhat groggy and was unable to swallow initially but this improved after patient returned to baseline.  Since this episode patient has become more congested and there was some concern over possible aspiration event.  EMS was notified and the patient was sent to the ER for further evaluation.  Chest x-ray revealed no active cardiopulmonary disease.  Patient's congestion has worsened since arriving to the ER and at one point her O2 saturations decreased to 91%.  Oxygen was applied for comfort.  I removed her oxygen during exam and her O2 sats decreased to 95% for me.  Exam she was quite congested throughout her bilateral lung fields posteriorly.  She appeared to have a mild acute kidney kidney injury leukocytosis, and her urinalysis was abnormal.  Family reiterates that although the patient ate solids well that she is very poor regarding fluid intake.  Patient also received an influenza  vaccine this year.  ED Course:  Vital Signs: BP 108/63   Pulse 63   Temp 98.3 F (36.8 C) (Oral)   Resp 18   Ht 5' (1.524 m)   Wt 49.9 kg (110 lb)   SpO2 98%   BMI 21.48 kg/m  Chest x-ray: As above Lab data: Sodium 136, potassium 3.7, chloride 104, CO2 21, glucose 107, BUN 23, creatinine 1.08, anion gap 11, GFR 43, white count 7600 differential not obtained, hemoglobin 10.6, platelets 196,000, urinalysis with hazy appearance, small hemoglobin, moderate leukocytes, bacteria few, WBC 6-30 Medications and treatments: Normal saline bolus 500 cc x1, Zofran 4 mg IV x1  Review of Systems:  In addition to the HPI above,  No Headache, changes with Vision or hearing, new weakness, tingling, numbness in any extremity, dizziness, dysarthria or word finding difficulty, gait disturbance or imbalance, tremors or seizure activity No problems swallowing food or Liquids, indigestion/reflux, choking or coughing while eating, abdominal pain with or after eating No Chest pain, palpitations, orthopnea or DOE No Abdominal pain, melena,hematochezia, dark tarry stools, constipation No dysuria, malodorous urine, hematuria or flank pain No new skin rashes, lesions, masses or bruises, No new joint pains, aches, swelling or redness No recent unintentional weight gain or loss No polyuria, polydypsia or polyphagia   Past Medical History:  Diagnosis Date  . Hypertension   . Pelvis fracture ([place])     History reviewed. No pertinent surgical history.  Social History   Socioeconomic History  . Marital status: Widowed    Spouse name: Not on file  . Number of children: Not on file  . Years of education: Not on file  .  Highest education level: Not on file  Social Needs  . Financial resource strain: Not on file  . Food insecurity - worry: Not on file  . Food insecurity - inability: Not on file  . Transportation needs - medical: Not on file  . Transportation needs - non-medical: Not on file  Occupational  History  . Not on file  Tobacco Use  . Smoking status: Never Smoker  . Smokeless tobacco: Never Used  Substance and Sexual Activity  . Alcohol use: Yes    Comment: ocasional  . Drug use: No  . Sexual activity: Not on file  Other Topics Concern  . Not on file  Social History Narrative  . Not on file    Mobility: Rolling walker Work history: Not obtained   No Known Allergies Family history reviewed and not pertinent to current presenting symptoms and differential diagnosis patient has advanced age and a degree of dementia and unable to adequately answer history questions regarding her family as well  Prior to Admission medications   Medication Sig Start Date End Date Taking? Authorizing Provider  acetaminophen (TYLENOL) 325 MG tablet Take 650 mg by mouth every 6 (six) hours as needed (breakthrough pain).   Yes [provider]  aspirin 81 MG chewable tablet Chew 81 mg by mouth daily.   Yes [provider]  azelastine (OPTIVAR) 0.05 % ophthalmic solution Apply 1 drop to eye every 12 (twelve) hours as needed (swelling or itching).   Yes [provider]  calcitonin, salmon, (MIACALCIN/FORTICAL) 200 UNIT/ACT nasal spray Place 1 spray into alternate nostrils daily.   Yes [provider]  calcium carbonate (OSCAL) 1500 (600 Ca) MG TABS tablet Take 1,500 mg by mouth daily.   Yes [provider]  Cyanocobalamin (VITAMIN B 12 PO) Take 1 tablet by mouth daily.   Yes [provider]  diclofenac sodium (VOLTAREN) 1 % GEL Apply 2 g topically 4 (four) times daily as needed (shoulder pain).   Yes [provider]  metoprolol succinate (TOPROL-XL) 50 MG 24 hr tablet Take 50 mg by mouth 2 (two) times daily.    Yes [provider]  bisacodyl (DULCOLAX) 5 MG EC tablet Take 2 tablets (10 mg total) by mouth daily as needed for moderate constipation. Patient not taking: Reported on 09/17/2017 10/21/15   Elgergawy, Dawood S, MD  oxyCODONE  (OXY IR/ROXICODONE) 5 MG immediate release tablet Take 1 tablet (5 mg total) by mouth every 4 (four) hours as needed for moderate pain. Patient not taking: Reported on 11/25/2015 10/21/15   Elgergawy, Dawood S, MD    Physical Exam: Vitals:   09/17/17 1515 09/17/17 1545 09/17/17 1600 09/17/17 1630  BP: (!) 150/66 135/75 108/63   Pulse: 63 61 63 66  Resp: 16 12 18 (!) 23  Temp:      TempSrc:      SpO2: 98% 98% 98% 98%  Weight:      Height:          Constitutional: NAD, calm, comfortable Eyes: PERRL, lids and conjunctivae normal ENMT: Mucous membranes are dry. Posterior pharynx clear of any exudate or lesions.Normal dentition.  Neck: normal, supple, no masses, no thyromegaly Respiratory: Coarse to auscultation bilaterally with diffuse expiratory rhonchi, normal respiratory effort without accessory muscle use rest.  Room air saturations as low as 91% currently on 2 L nasal cannula oxygen with saturations 98% Cardiovascular: Regular rate and rhythm, no murmurs / rubs / gallops. No extremity edema. 2+ pedal pulses. No carotid bruits.    Abdomen: no tenderness, no masses palpated. No hepatosplenomegaly. Bowel sounds positive.  Musculoskeletal: no clubbing / cyanosis. No joint deformity upper and lower extremities. Good ROM, no contractures. Normal muscle tone.  Skin: no rashes, lesions, ulcers. No induration Neurologic: CN 2-12 grossly intact. Sensation intact, DTR normal. Strength 5/5 x all 4 extremities.  Able to pull self up to seated position and lean forward for pulmonary exam. Psychiatric:  Alert and oriented x 3. Normal mood.    Labs on Admission: I have personally reviewed following labs and imaging studies  CBC: Recent Labs  Lab 09/17/17 1007  WBC 7.6  HGB 10.6*  HCT 33.1*  MCV 93.0  PLT 196   Basic Metabolic Panel: Recent Labs  Lab 09/17/17 1007  NA 136  K 3.7  CL 104  CO2 21*  GLUCOSE 107*  BUN 23*  CREATININE 1.08*  CALCIUM 9.3   GFR: Estimated Creatinine  Clearance: 23.4 mL/min (A) (by C-G formula based on SCr of 1.08 mg/dL (H)). Liver Function Tests: No results for input(s): AST, ALT, ALKPHOS, BILITOT, PROT, ALBUMIN in the last 168 hours. No results for input(s): LIPASE, AMYLASE in the last 168 hours. No results for input(s): AMMONIA in the last 168 hours. Coagulation Profile: No results for input(s): INR, PROTIME in the last 168 hours. Cardiac Enzymes: No results for input(s): CKTOTAL, CKMB, CKMBINDEX, TROPONINI in the last 168 hours. BNP (last 3 results) No results for input(s): PROBNP in the last 8760 hours. HbA1C: No results for input(s): HGBA1C in the last 72 hours. CBG: Recent Labs  Lab 09/17/17 1054  GLUCAP 108*   Lipid Profile: No results for input(s): CHOL, HDL, LDLCALC, TRIG, CHOLHDL, LDLDIRECT in the last 72 hours. Thyroid Function Tests: No results for input(s): TSH, T4TOTAL, FREET4, T3FREE, THYROIDAB in the last 72 hours. Anemia Panel: No results for input(s): VITAMINB12, FOLATE, FERRITIN, TIBC, IRON, RETICCTPCT in the last 72 hours. Urine analysis:    Component Value Date/Time   COLORURINE YELLOW 09/17/2017 1005   APPEARANCEUR HAZY (A) 09/17/2017 1005   LABSPEC 1.011 09/17/2017 1005   PHURINE 6.0 09/17/2017 1005   GLUCOSEU NEGATIVE 09/17/2017 1005   HGBUR SMALL (A) 09/17/2017 1005   BILIRUBINUR NEGATIVE 09/17/2017 1005   KETONESUR NEGATIVE 09/17/2017 1005   PROTEINUR NEGATIVE 09/17/2017 1005   NITRITE NEGATIVE 09/17/2017 1005   LEUKOCYTESUR MODERATE (A) 09/17/2017 1005   Sepsis Labs: @LABRCNTIP(procalcitonin:4,lacticidven:4) )No results found for this or any previous visit (from the past 240 hour(s)).   Radiological Exams on Admission: Dg Chest 2 View  Result Date: 09/17/2017 CLINICAL DATA:  Cough and chest pain. EXAM: CHEST  2 VIEW COMPARISON:  Chest x-ray dated November 25, 2015. MRI thoracic spine dated October 20, 2015. FINDINGS: The cardiomediastinal silhouette is normal in size. Atherosclerotic  calcification of the aortic arch. Normal pulmonary vascularity. Elevation of the right hemidiaphragm. No focal consolidation, pleural effusion, or pneumothorax. New, age-indeterminate moderate compression deformity of T11. Unchanged mild compression deformities of T3, and the superior endplates of T4 and L1. IMPRESSION: 1. No active cardiopulmonary disease. Elevation of the right hemidiaphragm. 2. New age-indeterminate, moderate compression deformity of T11. Correlate with point tenderness. 3.  Aortic atherosclerosis (ICD10-I70.0). Electronically Signed   By: William T Derry M.D.   On: 09/17/2017 10:39    EKG: (Independently reviewed) sinus rhythm with ventricular rate 66 bpm, QTC 4 and 51 ms, borderline voltage criteria for LVH, no acute ischemic changes, low voltage R waves in leads III and aVF  Assessment/Plan Principal Problem:  Acute respiratory insufficiency   2/2 presumed HCAP (healthcare-associated pneumonia) -Presents from nursing facility after episode of "fainting" followed by vomiting with reported history of diarrhea and emesis on 1/12 with the development of cough and congestion on 1/13-symptoms are concerning for URI/?  Viral given negative chest x-ray although could be evolving HCAP -Symptoms seem to worsen after fainting episode so consideration should be given to possible acute aspiration as well -CT of chest without contrast to better quantify -Obtain lactic acid, pro calcitonin, CRP, ESR and urinary strep; do not suspect heart failure but will check BNP as precaution -Patient did have episode of diarrhea so for completeness of exam will check urinary Legionella -Respiratory viral panel with droplet precautions-patient did have flu shot this season -Sputum culture, blood cultures -Empiric broad-spectrum antibiotics: Maxipime/vancomycin IV -Supportive care with oxygen-currently not hypoxemic but tolerating oxygen for comfort-does sound very congested upon exam with both lower  respiratory congestion as well as posterior pharyngeal noted on auscultation  Active Problems:   Acute kidney injury  -Very mild changes -Gentle IV fluid hydration at 75/hr-has received 1.5 L NS while in ER -Baseline renal function: 25/0.60 -Current renal function: 23/1.08    Abnormal urinalysis -Possibly related to UTI -Urine culture -Antibiotics as above    HTN (hypertension) -Current blood pressure suboptimal in setting of acute infectious process so we will hold preadmission Toprol    Normocytic anemia -Hgb stable and at baseline of around      DVT prophylaxis: Lovenox Code Status: DNR Family Communication: Family at bedside Disposition Plan: SNF Consults called: None    ELLIS,ALLISON L. ANP-BC [facility] Pager [phone]   If 7PM-7AM, please contact night-coverage [email or web address] Password [email or web address]  09/17/2017, 4:38 PM    

## 2017-09-18 ENCOUNTER — Encounter (HOSPITAL_COMMUNITY): Payer: Self-pay | Admitting: General Practice

## 2017-09-18 ENCOUNTER — Ambulatory Visit (HOSPITAL_COMMUNITY): Payer: Medicare Other

## 2017-09-18 DIAGNOSIS — R0689 Other abnormalities of breathing: Secondary | ICD-10-CM

## 2017-09-18 DIAGNOSIS — R1111 Vomiting without nausea: Secondary | ICD-10-CM

## 2017-09-18 DIAGNOSIS — I10 Essential (primary) hypertension: Secondary | ICD-10-CM

## 2017-09-18 DIAGNOSIS — I5030 Unspecified diastolic (congestive) heart failure: Secondary | ICD-10-CM

## 2017-09-18 LAB — RESPIRATORY PANEL BY PCR
Adenovirus: NOT DETECTED
BORDETELLA PERTUSSIS-RVPCR: NOT DETECTED
CHLAMYDOPHILA PNEUMONIAE-RVPPCR: NOT DETECTED
CORONAVIRUS HKU1-RVPPCR: NOT DETECTED
Coronavirus 229E: NOT DETECTED
Coronavirus NL63: NOT DETECTED
Coronavirus OC43: NOT DETECTED
INFLUENZA A-RVPPCR: NOT DETECTED
INFLUENZA B-RVPPCR: NOT DETECTED
METAPNEUMOVIRUS-RVPPCR: NOT DETECTED
Mycoplasma pneumoniae: NOT DETECTED
PARAINFLUENZA VIRUS 2-RVPPCR: NOT DETECTED
PARAINFLUENZA VIRUS 3-RVPPCR: NOT DETECTED
PARAINFLUENZA VIRUS 4-RVPPCR: NOT DETECTED
Parainfluenza Virus 1: NOT DETECTED
RESPIRATORY SYNCYTIAL VIRUS-RVPPCR: NOT DETECTED
RHINOVIRUS / ENTEROVIRUS - RVPPCR: DETECTED — AB

## 2017-09-18 LAB — COMPREHENSIVE METABOLIC PANEL
ALK PHOS: 75 U/L (ref 38–126)
ALT: 24 U/L (ref 14–54)
ANION GAP: 12 (ref 5–15)
AST: 28 U/L (ref 15–41)
Albumin: 3.2 g/dL — ABNORMAL LOW (ref 3.5–5.0)
BILIRUBIN TOTAL: 0.6 mg/dL (ref 0.3–1.2)
BUN: 20 mg/dL (ref 6–20)
CALCIUM: 8.5 mg/dL — AB (ref 8.9–10.3)
CO2: 16 mmol/L — ABNORMAL LOW (ref 22–32)
Chloride: 108 mmol/L (ref 101–111)
Creatinine, Ser: 0.85 mg/dL (ref 0.44–1.00)
GFR calc non Af Amer: 57 mL/min — ABNORMAL LOW (ref >60)
Glucose, Bld: 140 mg/dL — ABNORMAL HIGH (ref 65–99)
Potassium: 3.8 mmol/L (ref 3.5–5.1)
Sodium: 136 mmol/L (ref 135–145)
TOTAL PROTEIN: 6.3 g/dL — AB (ref 6.5–8.1)

## 2017-09-18 LAB — CBC WITH DIFFERENTIAL/PLATELET
BASOS PCT: 0 %
Basophils Absolute: 0 10*3/uL (ref 0.0–0.1)
EOS ABS: 0 10*3/uL (ref 0.0–0.7)
Eosinophils Relative: 0 %
HEMATOCRIT: 34.7 % — AB (ref 36.0–46.0)
HEMOGLOBIN: 11.2 g/dL — AB (ref 12.0–15.0)
LYMPHS ABS: 0.5 10*3/uL — AB (ref 0.7–4.0)
Lymphocytes Relative: 8 %
MCH: 30.4 pg (ref 26.0–34.0)
MCHC: 32.3 g/dL (ref 30.0–36.0)
MCV: 94.3 fL (ref 78.0–100.0)
MONO ABS: 0.1 10*3/uL (ref 0.1–1.0)
MONOS PCT: 2 %
NEUTROS ABS: 5.5 10*3/uL (ref 1.7–7.7)
NEUTROS PCT: 90 %
Platelets: 199 10*3/uL (ref 150–400)
RBC: 3.68 MIL/uL — ABNORMAL LOW (ref 3.87–5.11)
RDW: 13.8 % (ref 11.5–15.5)
WBC: 6.1 10*3/uL (ref 4.0–10.5)

## 2017-09-18 LAB — MRSA PCR SCREENING: MRSA BY PCR: NEGATIVE

## 2017-09-18 LAB — PROCALCITONIN: Procalcitonin: 0.21 ng/mL

## 2017-09-18 LAB — ECHOCARDIOGRAM COMPLETE
Height: 60 in
WEIGHTICAEL: 1883.61 [oz_av]

## 2017-09-18 MED ORDER — CALCIUM CARBONATE 1250 (500 CA) MG PO TABS
1.0000 | ORAL_TABLET | Freq: Every day | ORAL | Status: DC
  Administered 2017-09-18 – 2017-09-24 (×7): 500 mg via ORAL
  Filled 2017-09-18 (×7): qty 1

## 2017-09-18 MED ORDER — HYDRALAZINE HCL 20 MG/ML IJ SOLN
5.0000 mg | Freq: Four times a day (QID) | INTRAMUSCULAR | Status: DC | PRN
  Administered 2017-09-18 – 2017-09-23 (×6): 5 mg via INTRAVENOUS
  Filled 2017-09-18 (×7): qty 1

## 2017-09-18 MED ORDER — AMPICILLIN-SULBACTAM SODIUM 1.5 (1-0.5) G IJ SOLR
1.5000 g | Freq: Three times a day (TID) | INTRAMUSCULAR | Status: DC
  Administered 2017-09-18 – 2017-09-20 (×6): 1.5 g via INTRAVENOUS
  Filled 2017-09-18 (×9): qty 1.5

## 2017-09-18 MED ORDER — HALOPERIDOL LACTATE 5 MG/ML IJ SOLN
0.5000 mg | Freq: Once | INTRAMUSCULAR | Status: DC
  Filled 2017-09-18: qty 1

## 2017-09-18 NOTE — Progress Notes (Signed)
Pt refused AM lab draws. Phlebotomy to re attempt later.

## 2017-09-18 NOTE — Progress Notes (Signed)
AM BP's is 185/86. NP on call notified if PRN IV BP med could be placed on file for pt. Will continue to monitor.

## 2017-09-18 NOTE — Progress Notes (Signed)
[facility] PROGRESS NOTE  Katherine Kirby UJW:[id number] DOB: 22-Nov-1921 DOA: 09/17/2017 PCP: Lewis Moccasin, MD  Brief summary   84 y.o. female with medical history significant for hypertension, osteoporosis with history of prior pelvic fracture as well as spinal compression fractures, and normocytic anemia.  In talking with the patient's family this past Saturday on 1/12 patient had episode of diarrhea and emesis but improved after administration of Tylenol and Kaopectate.  On 1/13 developed congestion and coughing but cough was nonproductive and low-grade fevers.  Today while the patient was sitting at breakfast she fainted and then vomited.  Upon awakening the nurse noticed that the patient was somewhat groggy and was unable to swallow initially but this improved after patient returned to baseline.  Since this episode patient has become more congested and there was some concern over possible aspiration event.    Assessment/Plan:  Acute respiratory insufficiency 2/2 presumed aspiration, possible developing pneumonia. Symptoms seem to worsen after fainting episode so consideration should be given to possible acute aspiration as well -developing more productive cough, dyspnea. Will cont empiric broad-spectrum antibiotic for possible aspiration. Bronchodilators, oxygen as needed. Speech eval  -elevated bnp. H/o leg edema. Possible underlying chronic heart failure. Will obtain echo. D/c iv fluids   Abnormal urinalysis. Possibly related to UTI. Urine culture. Antibiotics as above  HTN (hypertension). Resume home regimen. Monitor   Normocytic anemia. Hgb stable and at baseline of around   Code Status: DNR Family Communication: d/w patient, her family (indicate person spoken with, relationship, and if by phone, the number) Disposition Plan: pend improvement. Obtain pt   Consultants:  None   Procedures:  none  Antibiotics: Anti-infectives (From admission, onward)   Start      Dose/Rate Route Frequency Ordered Stop   09/18/17 1700  vancomycin (VANCOCIN) 500 mg in sodium chloride 0.9 % 100 mL IVPB  Status:  Discontinued     500 mg 100 mL/hr over 60 Minutes Intravenous Every 24 hours 09/17/17 1702 09/17/17 1811   09/17/17 1715  vancomycin (VANCOCIN) IVPB 1000 mg/200 mL premix  Status:  Discontinued     1,000 mg 200 mL/hr over 60 Minutes Intravenous  Once 09/17/17 1702 09/17/17 1811   09/17/17 1659  ceFEPIme (MAXIPIME) 1 g in dextrose 5 % 50 mL IVPB  Status:  Discontinued     1 g 100 mL/hr over 30 Minutes Intravenous Every 24 hours 09/17/17 1702 09/17/17 1811   09/17/17 1630  ceFEPIme (MAXIPIME) 1 g in dextrose 5 % 50 mL IVPB  Status:  Discontinued     1 g 100 mL/hr over 30 Minutes Intravenous Every 8 hours 09/17/17 1624 09/17/17 1702      (indicate start date, and stop date if known)  HPI/Subjective: Alert, has productive cough, wheezing.   Objective: Vitals:   09/18/17 0538 09/18/17 0908  BP: (!) 185/86 (!) 134/107  Pulse: 72 (!) 115  Resp: 18   Temp: (!) 97.2 F (36.2 C)   SpO2: 96%     Intake/Output Summary (Last 24 hours) at 09/18/2017 1126 Last data filed at 09/18/2017 1004 Gross per 24 hour  Intake 1202.5 ml  Output -  Net 1202.5 ml   Filed Weights   09/17/17 1003 09/17/17 2036  Weight: 49.9 kg (110 lb) 53.4 kg (117 lb 11.6 oz)    Exam:   General:  No distress   Cardiovascular: s1,s2 rrr  Respiratory: few rales LL  Abdomen: soft, nt   Musculoskeletal: no leg edema    Data Reviewed:  Basic Metabolic Panel: Recent Labs  Lab 09/17/17 1007 09/18/17 0927  NA 136 136  K 3.7 3.8  CL 104 108  CO2 21* 16*  GLUCOSE 107* 140*  BUN 23* 20  CREATININE 1.08* 0.85  CALCIUM 9.3 8.5*   Liver Function Tests: Recent Labs  Lab 09/18/17 0927  AST 28  ALT 24  ALKPHOS 75  BILITOT 0.6  PROT 6.3*  ALBUMIN 3.2*   No results for input(s): LIPASE, AMYLASE in the last 168 hours. No results for input(s): AMMONIA in the last 168  hours. CBC: Recent Labs  Lab 09/17/17 1007 09/18/17 0927  WBC 7.6 6.1  NEUTROABS  --  5.5  HGB 10.6* 11.2*  HCT 33.1* 34.7*  MCV 93.0 94.3  PLT 196 199   Cardiac Enzymes: No results for input(s): CKTOTAL, CKMB, CKMBINDEX, TROPONINI in the last 168 hours. BNP (last 3 results) Recent Labs    09/17/17 1619  BNP 1,223.5*    ProBNP (last 3 results) No results for input(s): PROBNP in the last 8760 hours.  CBG: Recent Labs  Lab 09/17/17 1054  GLUCAP 108*    Recent Results (from the past 240 hour(s))  MRSA PCR Screening     Status: None   Collection Time: 09/17/17  8:58 PM  Result Value Ref Range Status   MRSA by PCR NEGATIVE NEGATIVE Final    Comment:        The GeneXpert MRSA Assay (FDA approved for NASAL specimens only), is one component of a comprehensive MRSA colonization surveillance program. It is not intended to diagnose MRSA infection nor to guide or monitor treatment for MRSA infections.      Studies: Dg Chest 2 View  Result Date: 09/17/2017 CLINICAL DATA:  Cough and chest pain. EXAM: CHEST  2 VIEW COMPARISON:  Chest x-ray dated November 25, 2015. MRI thoracic spine dated October 20, 2015. FINDINGS: The cardiomediastinal silhouette is normal in size. Atherosclerotic calcification of the aortic arch. Normal pulmonary vascularity. Elevation of the right hemidiaphragm. No focal consolidation, pleural effusion, or pneumothorax. New, age-indeterminate moderate compression deformity of T11. Unchanged mild compression deformities of T3, and the superior endplates of T4 and L1. IMPRESSION: 1. No active cardiopulmonary disease. Elevation of the right hemidiaphragm. 2. New age-indeterminate, moderate compression deformity of T11. Correlate with point tenderness. 3.  Aortic atherosclerosis (ICD10-I70.0). Electronically Signed   By: Obie Dredge M.D.   On: 09/17/2017 10:39   Ct Chest Wo Contrast  Result Date: 09/17/2017 CLINICAL DATA:  On 1/13 developed congestion and  coughing but cough was nonproductive and low-grade fevers. Today while the patient was sitting at breakfast she fainted and then vomited. Upon awakening the nurse noticed that the patient was so.*comment was truncated* EXAM: CT CHEST WITHOUT CONTRAST TECHNIQUE: Multidetector CT imaging of the chest was performed following the standard protocol without IV contrast. COMPARISON:  Chest radiograph 1149 FINDINGS: Cardiovascular: Coronary artery calcification and aortic atherosclerotic calcification. Mediastinum/Nodes: No axillary supraclavicular adenopathy. No mediastinal hilar adenopathy. No pericardial fluid Lungs/Pleura: Bronchial wall thickening in lower lobes. No airspace consolidation. No pulmonary edema. No pneumothorax or nodularity. Upper Abdomen: Limited view of the liver, kidneys, pancreas are unremarkable. Normal adrenal glands. Musculoskeletal: Compression fracture at T11. No peri spinal hematoma to suggest acute fracture. IMPRESSION: 1. Bronchial thickening in lower lobes suggest bronchitis. 2. No evidence pneumonia. 3. Compression fracture at T11 appears remote or subacute Aortic Atherosclerosis (ICD10-I70.0). Electronically Signed   By: Genevive Bi M.D.   On: 09/17/2017 17:47    Scheduled Meds: .  aspirin  81 mg Oral Daily  . calcitonin (salmon)  1 spray Alternating Nares Daily  . calcium carbonate  1 tablet Oral Q breakfast  . haloperidol lactate  0.5 mg Intravenous Once  . ketotifen  2 drop Both Eyes BID  . methylPREDNISolone (SOLU-MEDROL) injection  60 mg Intravenous Q12H  . metoprolol succinate  50 mg Oral Daily   Continuous Infusions: . sodium chloride 75 mL/hr at 09/18/17 [id number]    Principal Problem:   HCAP (healthcare-associated pneumonia) Active Problems:   Abnormal urinalysis   Respiratory insufficiency   HTN (hypertension)   Acute kidney injury (HCC)   Normocytic anemia    Time spent: >35 minutes     Esperanza SheetsBURIEV, Chloris Marcoux N  [facility] Pager [phone]. If  7PM-7AM, please contact night-coverage at [email or web address], password [email or web address] 09/18/2017, 11:26 AM  LOS: 1 day

## 2017-09-18 NOTE — Evaluation (Signed)
Clinical/Bedside Swallow Evaluation Patient Details  Name: Katherine ShihRachel Mazur MRN: [id number] Date of Birth: [birth year]  Today's Date: 09/18/2017 Time: SLP Start Time (ACUTE ONLY): 1516 SLP Stop Time (ACUTE ONLY): 1528 SLP Time Calculation (min) (ACUTE ONLY): 12 min  Past Medical History:  Past Medical History:  Diagnosis Date  . Anemia   . Hypertension   . Pelvis fracture [facility](HCC)    Past Surgical History:  Past Surgical History:  Procedure Laterality Date  . TOTAL HIP ARTHROPLASTY Left 2007   HPI:  84 y.o.femalewith medical history significant forhypertension, osteoporosis with history of prior pelvic fracture as well as spinal compression fractures, and normocytic anemia. Pt recently developed congestion and coughing s/p episdoes of diarrhea and emesis. On 1/14 she had a fainting spell that was followed by more vomiting, after which her congestion increased. There is concern for aspiration.   Assessment / Plan / Recommendation Clinical Impression  Pt's oropharyngeal swallow appears functional and she and her family deny any difficulties PTA. She does have baseline wheezing, although they report that this is actually improved from previous date. Recommend that pt advance her diet as tolerated. No acute SLP f/u indicated. SLP Visit Diagnosis: Dysphagia, unspecified (R13.10)    Aspiration Risk  Mild aspiration risk    Diet Recommendation Regular;Thin liquid   Liquid Administration via: Cup;Straw Medication Administration: Whole meds with liquid Supervision: Patient able to self feed;Intermittent supervision to cue for compensatory strategies Compensations: Slow rate;Small sips/bites Postural Changes: Seated upright at 90 degrees    Other  Recommendations Oral Care Recommendations: Oral care BID   Follow up Recommendations None      Frequency and Duration            Prognosis        Swallow Study   General HPI: 84 y.o.femalewith medical history significant  forhypertension, osteoporosis with history of prior pelvic fracture as well as spinal compression fractures, and normocytic anemia. Pt recently developed congestion and coughing s/p episdoes of diarrhea and emesis. On 1/14 she had a fainting spell that was followed by more vomiting, after which her congestion increased. There is concern for aspiration. Type of Study: Bedside Swallow Evaluation Previous Swallow Assessment: none in chart Diet Prior to this Study: Thin liquids(CLD) Temperature Spikes Noted: No Respiratory Status: Room air History of Recent Intubation: No Behavior/Cognition: Alert;Cooperative;Pleasant mood Oral Care Completed by SLP: No Oral Cavity - Dentition: Adequate natural dentition Vision: Functional for self-feeding Self-Feeding Abilities: Able to feed self Patient Positioning: Upright in bed Baseline Vocal Quality: Normal Volitional Cough: Congested    Oral/Motor/Sensory Function Overall Oral Motor/Sensory Function: Other (comment)(appears functional)   Ice Chips Ice chips: Not tested   Thin Liquid Thin Liquid: Within functional limits Presentation: Self Fed;Straw    Nectar Thick Nectar Thick Liquid: Not tested   Honey Thick Honey Thick Liquid: Not tested   Puree Puree: Within functional limits Presentation: Self Fed;Spoon   Solid   GO   Solid: Not tested        Maxcine Hamaiewonsky, Lunette Tapp 09/18/2017,4:29 PM  Maxcine HamLaura Paiewonsky, M.A. CCC-SLP [phone]

## 2017-09-18 NOTE — Progress Notes (Signed)
  Echocardiogram 2D Echocardiogram has been performed.  Katherine SkeenVijay  Katherine Kirby 09/18/2017, 2:07 PM

## 2017-09-18 NOTE — Progress Notes (Signed)
Pt refused her dose of Metoprolol. CN and this RN attempted to give the medication. Family was called to see if they could encourage her to take it. She still refused and wanted to be left alone. Will continue to monitor.

## 2017-09-18 NOTE — Progress Notes (Signed)
At around 2030, pt arrived room in the presence of ED tech and family members. Pt was assessed to be AxOx3. No complaints of pain. Cardiac telemetry initiated, skin assessment completed and vital signs completed. BP is high with the SBPs in the 180's. NP on call notified. BP med was ordered. Will follow commands and continue to monitor.

## 2017-09-19 DIAGNOSIS — J189 Pneumonia, unspecified organism: Secondary | ICD-10-CM

## 2017-09-19 LAB — PROCALCITONIN: Procalcitonin: 0.13 ng/mL

## 2017-09-19 LAB — BASIC METABOLIC PANEL
Anion gap: 8 (ref 5–15)
BUN: 23 mg/dL — AB (ref 6–20)
CO2: 19 mmol/L — AB (ref 22–32)
Calcium: 8.7 mg/dL — ABNORMAL LOW (ref 8.9–10.3)
Chloride: 112 mmol/L — ABNORMAL HIGH (ref 101–111)
Creatinine, Ser: 0.86 mg/dL (ref 0.44–1.00)
GFR calc Af Amer: >60 mL/min (ref >60)
GFR calc non Af Amer: 56 mL/min — ABNORMAL LOW (ref >60)
Glucose, Bld: 112 mg/dL — ABNORMAL HIGH (ref 65–99)
Potassium: 3.7 mmol/L (ref 3.5–5.1)
Sodium: 139 mmol/L (ref 135–145)

## 2017-09-19 MED ORDER — FUROSEMIDE 10 MG/ML IJ SOLN
20.0000 mg | Freq: Once | INTRAMUSCULAR | Status: AC
  Administered 2017-09-19: 20 mg via INTRAVENOUS
  Filled 2017-09-19: qty 2

## 2017-09-19 MED ORDER — LORAZEPAM 2 MG/ML IJ SOLN: 0.2500 mg | Freq: Once | INTRAMUSCULAR | Status: DC | PRN

## 2017-09-19 MED ORDER — POTASSIUM CHLORIDE CRYS ER 20 MEQ PO TBCR
40.0000 meq | EXTENDED_RELEASE_TABLET | Freq: Once | ORAL | Status: AC
  Administered 2017-09-19: 40 meq via ORAL
  Filled 2017-09-19: qty 2

## 2017-09-19 MED ORDER — IPRATROPIUM-ALBUTEROL 0.5-2.5 (3) MG/3ML IN SOLN
3.0000 mL | Freq: Four times a day (QID) | RESPIRATORY_TRACT | Status: DC
  Administered 2017-09-19 – 2017-09-20 (×3): 3 mL via RESPIRATORY_TRACT
  Filled 2017-09-19 (×3): qty 3

## 2017-09-19 MED ORDER — GUAIFENESIN ER 600 MG PO TB12
600.0000 mg | ORAL_TABLET | Freq: Two times a day (BID) | ORAL | Status: DC
  Administered 2017-09-19 – 2017-09-24 (×11): 600 mg via ORAL
  Filled 2017-09-19 (×11): qty 1

## 2017-09-19 MED ORDER — ENOXAPARIN SODIUM 40 MG/0.4ML ~~LOC~~ SOLN
40.0000 mg | SUBCUTANEOUS | Status: DC
  Administered 2017-09-19 – 2017-09-20 (×2): 40 mg via SUBCUTANEOUS
  Filled 2017-09-19 (×2): qty 0.4

## 2017-09-19 MED ORDER — METHYLPREDNISOLONE SODIUM SUCC 125 MG IJ SOLR
60.0000 mg | Freq: Two times a day (BID) | INTRAMUSCULAR | Status: DC
  Administered 2017-09-19 – 2017-09-24 (×11): 60 mg via INTRAVENOUS
  Filled 2017-09-19 (×11): qty 2

## 2017-09-19 NOTE — Progress Notes (Signed)
Pt refused labs and AM Meds. CN made aware. Will continue to monitor.

## 2017-09-19 NOTE — Progress Notes (Signed)
PROGRESS NOTE    Katherine ShihRachel Kirby  ZOX:[id number] DOB: [birth year] DOA: 09/17/2017 PCP: Lewis Moccasinewey, Elizabeth R, MD    Brief Narrative: Brief summary   84 y.o.femalewith medical history significant forhypertension, osteoporosis with history of prior pelvic fracture as well as spinal compression fractures, and normocytic anemia. In talking with the patient's family this past Saturday on 1/12 patient had episode of diarrhea and emesis but improved after administration of Tylenol and Kaopectate. On 1/13 developed congestion and coughing but cough was nonproductive and low-grade fevers. Today while the patient was sitting at breakfast she fainted and then vomited. Upon awakening the nurse noticed that the patient was somewhat groggy and was unable to swallow initially but this improved after patient returned to baseline. Since this episode patient has become more congested and there was some concern over possible aspiration event.      Assessment & Plan:   Principal Problem:   HCAP (healthcare-associated pneumonia) Active Problems:   Abnormal urinalysis   Respiratory insufficiency   HTN (hypertension)   Acute kidney injury (HCC)   Normocytic anemia  1-Acute hypoxic respiratory failure; related to aspiration PNA, bronchitis and HF exacerbation.  Positive Rhinovirus.  -will schedule nebulizer. Add solumedrol.  -continue with IV antibiotics.  Will give one time dose of lasix.  Start guaifenesin.   2-syncope; ECHO normal EF.  On telemetry.  Check EKG>   3-possible UTI; on IV antibiotics.   Metabolic acidosis; improved.   HTN (hypertension). Continue with metoprolol. PRN hydralazine.   Normocytic anemia. Hgbstable    DVT prophylaxis: Code Status: DNR Family Communication; discussed  Disposition Plan:   Consultants:   none   Procedures:   ECHO; hypertrophic cardiomyopathy.   Antimicrobials:  unasyn   Subjective: She denies worsening dyspnea, family think  patient is breathing worse today, more congested.  patient report productive cough./   Objective: Vitals:   09/18/17 2135 09/19/17 0626 09/19/17 0632 09/19/17 0916  BP: (!) 155/74 (!) 216/174  (!) 159/86  Pulse: 72 95 83 84  Resp: 20 20 (!) 21   Temp: 98.4 F (36.9 C) 97.9 F (36.6 C)  97.9 F (36.6 C)  TempSrc: Oral   Oral  SpO2: 95% 98% 96%   Weight:      Height:        Intake/Output Summary (Last 24 hours) at 09/19/2017 1015 Last data filed at 09/18/2017 1806 Gross per 24 hour  Intake 390 ml  Output 800 ml  Net -410 ml   Filed Weights   09/17/17 1003 09/17/17 2036  Weight: 49.9 kg (110 lb) 53.4 kg (117 lb 11.6 oz)    Examination:  General exam: Appears calm and comfortable  Respiratory system: bilateral wheezing, crackles.  Cardiovascular system: S1 & S2 heard, RRR. No JVD, murmurs, rubs, gallops or clicks. No pedal edema. Gastrointestinal system: Abdomen is nondistended, soft and nontender. No organomegaly or masses felt. Normal bowel sounds heard. Central nervous system: Alert and oriented. No focal neurological deficits. Extremities: Symmetric 5 x 5 power. Skin: No rashes, lesions or ulcers    Data Reviewed: I have personally reviewed following labs and imaging studies  CBC: Recent Labs  Lab 09/17/17 1007 09/18/17 0927  WBC 7.6 6.1  NEUTROABS  --  5.5  HGB 10.6* 11.2*  HCT 33.1* 34.7*  MCV 93.0 94.3  PLT 196 199   Basic Metabolic Panel: Recent Labs  Lab 09/17/17 1007 09/18/17 0927 09/19/17 0706  NA 136 136 139  K 3.7 3.8 3.7  CL 104 108 112*  CO2 21* 16* 19*  GLUCOSE 107* 140* 112*  BUN 23* 20 23*  CREATININE 1.08* 0.85 0.86  CALCIUM 9.3 8.5* 8.7*   GFR: Estimated Creatinine Clearance: 29.4 mL/min (by C-G formula based on SCr of 0.86 mg/dL). Liver Function Tests: Recent Labs  Lab 09/18/17 0927  AST 28  ALT 24  ALKPHOS 75  BILITOT 0.6  PROT 6.3*  ALBUMIN 3.2*   No results for input(s): LIPASE, AMYLASE in the last 168 hours. No  results for input(s): AMMONIA in the last 168 hours. Coagulation Profile: No results for input(s): INR, PROTIME in the last 168 hours. Cardiac Enzymes: No results for input(s): CKTOTAL, CKMB, CKMBINDEX, TROPONINI in the last 168 hours. BNP (last 3 results) No results for input(s): PROBNP in the last 8760 hours. HbA1C: No results for input(s): HGBA1C in the last 72 hours. CBG: Recent Labs  Lab 09/17/17 1054  GLUCAP 108*   Lipid Profile: No results for input(s): CHOL, HDL, LDLCALC, TRIG, CHOLHDL, LDLDIRECT in the last 72 hours. Thyroid Function Tests: No results for input(s): TSH, T4TOTAL, FREET4, T3FREE, THYROIDAB in the last 72 hours. Anemia Panel: No results for input(s): VITAMINB12, FOLATE, FERRITIN, TIBC, IRON, RETICCTPCT in the last 72 hours. Sepsis Labs: Recent Labs  Lab 09/17/17 1607 09/17/17 1627 09/18/17 0927 09/19/17 0706  PROCALCITON  --  0.26 0.21 0.13  LATICACIDVEN 0.7  --   --   --     Recent Results (from the past 240 hour(s))  MRSA PCR Screening     Status: None   Collection Time: 09/17/17  8:58 PM  Result Value Ref Range Status   MRSA by PCR NEGATIVE NEGATIVE Final    Comment:        The GeneXpert MRSA Assay (FDA approved for NASAL specimens only), is one component of a comprehensive MRSA colonization surveillance program. It is not intended to diagnose MRSA infection nor to guide or monitor treatment for MRSA infections.   Respiratory Panel by PCR     Status: Abnormal   Collection Time: 09/18/17  4:54 AM  Result Value Ref Range Status   Adenovirus NOT DETECTED NOT DETECTED Final   Coronavirus 229E NOT DETECTED NOT DETECTED Final   Coronavirus HKU1 NOT DETECTED NOT DETECTED Final   Coronavirus NL63 NOT DETECTED NOT DETECTED Final   Coronavirus OC43 NOT DETECTED NOT DETECTED Final   Metapneumovirus NOT DETECTED NOT DETECTED Final   Rhinovirus / Enterovirus DETECTED (A) NOT DETECTED Final   Influenza A NOT DETECTED NOT DETECTED Final    Influenza B NOT DETECTED NOT DETECTED Final   Parainfluenza Virus 1 NOT DETECTED NOT DETECTED Final   Parainfluenza Virus 2 NOT DETECTED NOT DETECTED Final   Parainfluenza Virus 3 NOT DETECTED NOT DETECTED Final   Parainfluenza Virus 4 NOT DETECTED NOT DETECTED Final   Respiratory Syncytial Virus NOT DETECTED NOT DETECTED Final   Bordetella pertussis NOT DETECTED NOT DETECTED Final   Chlamydophila pneumoniae NOT DETECTED NOT DETECTED Final   Mycoplasma pneumoniae NOT DETECTED NOT DETECTED Final         Radiology Studies: Dg Chest 2 View  Result Date: 09/17/2017 CLINICAL DATA:  Cough and chest pain. EXAM: CHEST  2 VIEW COMPARISON:  Chest x-ray dated November 25, 2015. MRI thoracic spine dated October 20, 2015. FINDINGS: The cardiomediastinal silhouette is normal in size. Atherosclerotic calcification of the aortic arch. Normal pulmonary vascularity. Elevation of the right hemidiaphragm. No focal consolidation, pleural effusion, or pneumothorax. New, age-indeterminate moderate compression deformity of T11. Unchanged mild compression  deformities of T3, and the superior endplates of T4 and L1. IMPRESSION: 1. No active cardiopulmonary disease. Elevation of the right hemidiaphragm. 2. New age-indeterminate, moderate compression deformity of T11. Correlate with point tenderness. 3.  Aortic atherosclerosis (ICD10-I70.0). Electronically Signed   By: Obie Dredge M.D.   On: 09/17/2017 10:39   Ct Chest Wo Contrast  Result Date: 09/17/2017 CLINICAL DATA:  On 1/13 developed congestion and coughing but cough was nonproductive and low-grade fevers. Today while the patient was sitting at breakfast she fainted and then vomited. Upon awakening the nurse noticed that the patient was so.*comment was truncated* EXAM: CT CHEST WITHOUT CONTRAST TECHNIQUE: Multidetector CT imaging of the chest was performed following the standard protocol without IV contrast. COMPARISON:  Chest radiograph 1149 FINDINGS:  Cardiovascular: Coronary artery calcification and aortic atherosclerotic calcification. Mediastinum/Nodes: No axillary supraclavicular adenopathy. No mediastinal hilar adenopathy. No pericardial fluid Lungs/Pleura: Bronchial wall thickening in lower lobes. No airspace consolidation. No pulmonary edema. No pneumothorax or nodularity. Upper Abdomen: Limited view of the liver, kidneys, pancreas are unremarkable. Normal adrenal glands. Musculoskeletal: Compression fracture at T11. No peri spinal hematoma to suggest acute fracture. IMPRESSION: 1. Bronchial thickening in lower lobes suggest bronchitis. 2. No evidence pneumonia. 3. Compression fracture at T11 appears remote or subacute Aortic Atherosclerosis (ICD10-I70.0). Electronically Signed   By: Genevive Bi M.D.   On: 09/17/2017 17:47        Scheduled Meds: . aspirin  81 mg Oral Daily  . calcitonin (salmon)  1 spray Alternating Nares Daily  . calcium carbonate  1 tablet Oral Q breakfast  . furosemide  20 mg Intravenous Once  . guaiFENesin  600 mg Oral BID  . haloperidol lactate  0.5 mg Intravenous Once  . ipratropium-albuterol  3 mL Nebulization Q6H  . ketotifen  2 drop Both Eyes BID  . methylPREDNISolone (SOLU-MEDROL) injection  60 mg Intravenous Q12H  . metoprolol succinate  50 mg Oral Daily  . potassium chloride  40 mEq Oral Once   Continuous Infusions: . ampicillin-sulbactam (UNASYN) IV Stopped (09/19/17 0731)     LOS: 2 days    Time spent: 35 minutes,     Alba Cory, MD [facility] Pager [phone]  If 7PM-7AM, please contact night-coverage [email or web address] Password [email or web address] 09/19/2017, 10:15 AM

## 2017-09-19 NOTE — Progress Notes (Signed)
Day shift RN paged Donnamarie PoagK. Kirby, NP to make her aware patient usually takes Metoprolol 50 mg bid per family and they were wondering if patient should be taking that dosage in hospital.  NP returned page and spoke to me, making me aware she will look into this matter and adjust dosage if needed. P.J. Henderson NewcomerSexton, RN

## 2017-09-20 ENCOUNTER — Inpatient Hospital Stay (HOSPITAL_COMMUNITY): Payer: Medicare Other

## 2017-09-20 LAB — BASIC METABOLIC PANEL
ANION GAP: 11 (ref 5–15)
BUN: 27 mg/dL — ABNORMAL HIGH (ref 6–20)
CALCIUM: 9.5 mg/dL (ref 8.9–10.3)
CO2: 22 mmol/L (ref 22–32)
CREATININE: 1.01 mg/dL — AB (ref 0.44–1.00)
Chloride: 107 mmol/L (ref 101–111)
GFR calc non Af Amer: 46 mL/min — ABNORMAL LOW (ref >60)
GFR, EST AFRICAN AMERICAN: 54 mL/min — AB (ref >60)
Glucose, Bld: 153 mg/dL — ABNORMAL HIGH (ref 65–99)
Potassium: 4.5 mmol/L (ref 3.5–5.1)
SODIUM: 140 mmol/L (ref 135–145)

## 2017-09-20 MED ORDER — IPRATROPIUM-ALBUTEROL 0.5-2.5 (3) MG/3ML IN SOLN
3.0000 mL | Freq: Four times a day (QID) | RESPIRATORY_TRACT | Status: DC
  Administered 2017-09-20 – 2017-09-21 (×3): 3 mL via RESPIRATORY_TRACT
  Filled 2017-09-20 (×3): qty 3

## 2017-09-20 MED ORDER — ENOXAPARIN SODIUM 30 MG/0.3ML ~~LOC~~ SOLN
30.0000 mg | SUBCUTANEOUS | Status: DC
Start: 2017-09-21 — End: 2017-09-24
  Administered 2017-09-21 – 2017-09-23 (×3): 30 mg via SUBCUTANEOUS
  Filled 2017-09-20 (×3): qty 0.3

## 2017-09-20 MED ORDER — HYDRALAZINE HCL 25 MG PO TABS
25.0000 mg | ORAL_TABLET | Freq: Two times a day (BID) | ORAL | Status: DC
  Administered 2017-09-20 – 2017-09-22 (×6): 25 mg via ORAL
  Filled 2017-09-20 (×7): qty 1

## 2017-09-20 MED ORDER — BOOST / RESOURCE BREEZE PO LIQD CUSTOM
1.0000 | Freq: Three times a day (TID) | ORAL | Status: DC
  Administered 2017-09-20 – 2017-09-24 (×11): 1 via ORAL

## 2017-09-20 MED ORDER — SODIUM CHLORIDE 0.9 % IV SOLN
1.5000 g | Freq: Two times a day (BID) | INTRAVENOUS | Status: DC
  Administered 2017-09-20 – 2017-09-24 (×8): 1.5 g via INTRAVENOUS
  Filled 2017-09-20 (×9): qty 1.5

## 2017-09-20 NOTE — Progress Notes (Signed)
MD on call notified of pt's cxr results showing mid rt base atelectasis and/or scarring & stable cardiomegaly. Sanda LingerMilam, Odie Rauen R, RN

## 2017-09-20 NOTE — Progress Notes (Signed)
PROGRESS NOTE    Katherine Kirby  ZOX:[id number] DOB: 18-Aug-1922 DOA: 09/17/2017 PCP: Lewis Moccasin, MD    Brief Narrative: Brief summary   84 y.o.femalewith medical history significant forhypertension, osteoporosis with history of prior pelvic fracture as well as spinal compression fractures, and normocytic anemia. In talking with the patient's family this past Saturday on 1/12 patient had episode of diarrhea and emesis but improved after administration of Tylenol and Kaopectate. On 1/13 developed congestion and coughing but cough was nonproductive and low-grade fevers. Today while the patient was sitting at breakfast she fainted and then vomited. Upon awakening the nurse noticed that the patient was somewhat groggy and was unable to swallow initially but this improved after patient returned to baseline. Since this episode patient has become more congested and there was some concern over possible aspiration event.      Assessment & Plan:   Principal Problem:   HCAP (healthcare-associated pneumonia) Active Problems:   Abnormal urinalysis   Respiratory insufficiency   HTN (hypertension)   Acute kidney injury (HCC)   Normocytic anemia  1-Acute hypoxic respiratory failure; related to aspiration PNA, bronchitis and HF exacerbation.  Positive Rhinovirus.  -will schedule nebulizer. Continue with  solumedrol.  -Continue with IV antibiotics.  -Continue with  guaifenesin.  -received one dose of lasix.  -Repeated chest x ray no pulmonary edema.   2-syncope; ECHO normal EF.  On telemetry.  EKG sinus rhythm.   3-possible UTI; on IV antibiotics.   Metabolic acidosis; improved.   HTN (hypertension). Continue with metoprolol. PRN hydralazine.  will schedule hydralazine to controlled BP.   Normocytic anemia. Hgbstable    DVT prophylaxis: Code Status: DNR Family Communication; discussed  Disposition Plan:   Consultants:   none   Procedures:   ECHO;  hypertrophic cardiomyopathy.   Antimicrobials:  unasyn   Subjective: She report feeling ok. Still with a lot congestions and cough   Objective: Vitals:   09/20/17 0736 09/20/17 0806 09/20/17 0904 09/20/17 1358  BP: (!) 199/115 (!) 178/70  (!) 141/60  Pulse:   77 71  Resp:      Temp:    98.1 F (36.7 C)  TempSrc:    Oral  SpO2:    99%  Weight:      Height:        Intake/Output Summary (Last 24 hours) at 09/20/2017 1433 Last data filed at 09/20/2017 1339 Gross per 24 hour  Intake 400 ml  Output 1150 ml  Net -750 ml   Filed Weights   09/17/17 1003 09/17/17 2036  Weight: 49.9 kg (110 lb) 53.4 kg (117 lb 11.6 oz)    Examination:  General exam: NAD Respiratory system: Normal respiratory effort, Bilateral ronchus.  Cardiovascular system: S 1, S 2 RRR Gastrointestinal system: BS present, soft, nt Central nervous system: non focal.  Extremities: Symmetric power.     Data Reviewed: I have personally reviewed following labs and imaging studies  CBC: Recent Labs  Lab 09/17/17 1007 09/18/17 0927  WBC 7.6 6.1  NEUTROABS  --  5.5  HGB 10.6* 11.2*  HCT 33.1* 34.7*  MCV 93.0 94.3  PLT 196 199   Basic Metabolic Panel: Recent Labs  Lab 09/17/17 1007 09/18/17 0927 09/19/17 0706 09/20/17 1130  NA 136 136 139 140  K 3.7 3.8 3.7 4.5  CL 104 108 112* 107  CO2 21* 16* 19* 22  GLUCOSE 107* 140* 112* 153*  BUN 23* 20 23* 27*  CREATININE 1.08* 0.85 0.86 1.01*  CALCIUM 9.3  8.5* 8.7* 9.5   GFR: Estimated Creatinine Clearance: 25 mL/min (A) (by C-G formula based on SCr of 1.01 mg/dL (H)). Liver Function Tests: Recent Labs  Lab 09/18/17 0927  AST 28  ALT 24  ALKPHOS 75  BILITOT 0.6  PROT 6.3*  ALBUMIN 3.2*   No results for input(s): LIPASE, AMYLASE in the last 168 hours. No results for input(s): AMMONIA in the last 168 hours. Coagulation Profile: No results for input(s): INR, PROTIME in the last 168 hours. Cardiac Enzymes: No results for input(s): CKTOTAL,  CKMB, CKMBINDEX, TROPONINI in the last 168 hours. BNP (last 3 results) No results for input(s): PROBNP in the last 8760 hours. HbA1C: No results for input(s): HGBA1C in the last 72 hours. CBG: Recent Labs  Lab 09/17/17 1054  GLUCAP 108*   Lipid Profile: No results for input(s): CHOL, HDL, LDLCALC, TRIG, CHOLHDL, LDLDIRECT in the last 72 hours. Thyroid Function Tests: No results for input(s): TSH, T4TOTAL, FREET4, T3FREE, THYROIDAB in the last 72 hours. Anemia Panel: No results for input(s): VITAMINB12, FOLATE, FERRITIN, TIBC, IRON, RETICCTPCT in the last 72 hours. Sepsis Labs: Recent Labs  Lab 09/17/17 1607 09/17/17 1627 09/18/17 0927 09/19/17 0706  PROCALCITON  --  0.26 0.21 0.13  LATICACIDVEN 0.7  --   --   --     Recent Results (from the past 240 hour(s))  MRSA PCR Screening     Status: None   Collection Time: 09/17/17  8:58 PM  Result Value Ref Range Status   MRSA by PCR NEGATIVE NEGATIVE Final    Comment:        The GeneXpert MRSA Assay (FDA approved for NASAL specimens only), is one component of a comprehensive MRSA colonization surveillance program. It is not intended to diagnose MRSA infection nor to guide or monitor treatment for MRSA infections.   Respiratory Panel by PCR     Status: Abnormal   Collection Time: 09/18/17  4:54 AM  Result Value Ref Range Status   Adenovirus NOT DETECTED NOT DETECTED Final   Coronavirus 229E NOT DETECTED NOT DETECTED Final   Coronavirus HKU1 NOT DETECTED NOT DETECTED Final   Coronavirus NL63 NOT DETECTED NOT DETECTED Final   Coronavirus OC43 NOT DETECTED NOT DETECTED Final   Metapneumovirus NOT DETECTED NOT DETECTED Final   Rhinovirus / Enterovirus DETECTED (A) NOT DETECTED Final   Influenza A NOT DETECTED NOT DETECTED Final   Influenza B NOT DETECTED NOT DETECTED Final   Parainfluenza Virus 1 NOT DETECTED NOT DETECTED Final   Parainfluenza Virus 2 NOT DETECTED NOT DETECTED Final   Parainfluenza Virus 3 NOT DETECTED  NOT DETECTED Final   Parainfluenza Virus 4 NOT DETECTED NOT DETECTED Final   Respiratory Syncytial Virus NOT DETECTED NOT DETECTED Final   Bordetella pertussis NOT DETECTED NOT DETECTED Final   Chlamydophila pneumoniae NOT DETECTED NOT DETECTED Final   Mycoplasma pneumoniae NOT DETECTED NOT DETECTED Final         Radiology Studies: Dg Chest 2 View  Result Date: 09/20/2017 CLINICAL DATA:  Cough and wheezing. EXAM: CHEST  2 VIEW COMPARISON:  CT 09/17/2017.  Chest x-ray 09/17/2017. FINDINGS: Mediastinum hilar structures normal. Stable cardiomegaly with normal pulmonary vascularity. Mild right base subsegmental atelectasis and/or scarring again noted. No pleural effusion or pneumothorax. Interposition of the colon under the right hemidiaphragm present. No acute bony abnormality. IMPRESSION: Stable mild right base atelectasis and/or scarring again and elevation right hemidiaphragm again noted. No acute abnormality identified. 2.  Stable cardiomegaly. Electronically Signed   By:  Thomas  Register   On: 09/20/2017 12:56        Scheduled Meds: . aspirin  81 mg Oral Daily  . calcitonin (salmon)  1 spray Alternating Nares Daily  . calcium carbonate  1 tablet Oral Q breakfast  . enoxaparin (LOVENOX) injection  40 mg Subcutaneous Q24H  . feeding supplement  1 Container Oral TID BM  . guaiFENesin  600 mg Oral BID  . haloperidol lactate  0.5 mg Intravenous Once  . hydrALAZINE  25 mg Oral BID  . ipratropium-albuterol  3 mL Nebulization Q6H  . ketotifen  2 drop Both Eyes BID  . methylPREDNISolone (SOLU-MEDROL) injection  60 mg Intravenous Q12H  . metoprolol succinate  50 mg Oral Daily   Continuous Infusions: . ampicillin-sulbactam (UNASYN) IV Stopped (09/20/17 0446)     LOS: 3 days    Time spent: 35 minutes,     Alba CoryBelkys A Kayleann Mccaffery, MD [facility] Pager [phone]  If 7PM-7AM, please contact night-coverage [email or web address] Password [email or web address] 09/20/2017, 2:33 PM

## 2017-09-20 NOTE — Progress Notes (Signed)
Patient did not want to do breathing treatment at this time, no distress noted RCP will continue to monitor.

## 2017-09-21 MED ORDER — POLYETHYLENE GLYCOL 3350 17 G PO PACK
17.0000 g | PACK | Freq: Two times a day (BID) | ORAL | Status: DC
  Administered 2017-09-21 – 2017-09-24 (×6): 17 g via ORAL
  Filled 2017-09-21 (×7): qty 1

## 2017-09-21 MED ORDER — BUDESONIDE 0.25 MG/2ML IN SUSP
0.2500 mg | Freq: Two times a day (BID) | RESPIRATORY_TRACT | Status: DC
  Administered 2017-09-21 – 2017-09-24 (×6): 0.25 mg via RESPIRATORY_TRACT
  Filled 2017-09-21 (×7): qty 2

## 2017-09-21 MED ORDER — IPRATROPIUM-ALBUTEROL 0.5-2.5 (3) MG/3ML IN SOLN
3.0000 mL | Freq: Three times a day (TID) | RESPIRATORY_TRACT | Status: DC
  Administered 2017-09-21 – 2017-09-24 (×8): 3 mL via RESPIRATORY_TRACT
  Filled 2017-09-21 (×8): qty 3

## 2017-09-21 MED ORDER — SENNOSIDES-DOCUSATE SODIUM 8.6-50 MG PO TABS
1.0000 | ORAL_TABLET | Freq: Two times a day (BID) | ORAL | Status: DC
  Administered 2017-09-21 – 2017-09-24 (×6): 1 via ORAL
  Filled 2017-09-21 (×7): qty 1

## 2017-09-21 NOTE — Progress Notes (Signed)
PROGRESS NOTE    Katherine Kirby  NFA:[id number] DOB: 03/28/1922 DOA: 09/17/2017 PCP: Lewis Moccasin, MD    Brief Narrative: Brief summary   84 y.o.femalewith medical history significant forhypertension, osteoporosis with history of prior pelvic fracture as well as spinal compression fractures, and normocytic anemia. In talking with the patient's family this past Saturday on 1/12 patient had episode of diarrhea and emesis but improved after administration of Tylenol and Kaopectate. On 1/13 developed congestion and coughing but cough was nonproductive and low-grade fevers. Today while the patient was sitting at breakfast she fainted and then vomited. Upon awakening the nurse noticed that the patient was somewhat groggy and was unable to swallow initially but this improved after patient returned to baseline. Since this episode patient has become more congested and there was some concern over possible aspiration event.      Assessment & Plan:   Principal Problem:   HCAP (healthcare-associated pneumonia) Active Problems:   Abnormal urinalysis   Respiratory insufficiency   HTN (hypertension)   Acute kidney injury (HCC)   Normocytic anemia  1-Acute hypoxic respiratory failure; related to aspiration PNA, bronchitis and HF exacerbation.  Positive Rhinovirus.  -will schedule nebulizer. Continue with  solumedrol.  -Continue with IV antibiotics.  -Continue with  guaifenesin.  -received one dose of lasix.  -Repeated chest x ray no pulmonary edema.  Will add Pulmicort.   2-syncope; ECHO normal EF.  On telemetry.  EKG sinus rhythm.   3-possible UTI; on IV antibiotics.   Metabolic acidosis; improved.   HTN (hypertension). Continue with metoprolol daily .  PRN hydralazine.  will schedule hydralazine to controlled BP.   Normocytic anemia. Hgbstable    DVT prophylaxis: Code Status: DNR Family Communication; discussed  Disposition Plan:   Consultants:    none   Procedures:   ECHO; hypertrophic cardiomyopathy.   Antimicrobials:  unasyn   Subjective: Report breathing a little better. Still with very bad cough   Objective: Vitals:   09/21/17 0915 09/21/17 1136 09/21/17 1317 09/21/17 1432  BP: (!) 210/72 (!) 161/64  124/87  Pulse:    88  Resp:    18  Temp:  98.2 F (36.8 C)  98.1 F (36.7 C)  TempSrc:  Oral  Oral  SpO2: 94% 95% 94% 96%  Weight:      Height:        Intake/Output Summary (Last 24 hours) at 09/21/2017 1446 Last data filed at 09/21/2017 1200 Gross per 24 hour  Intake 290 ml  Output -  Net 290 ml   Filed Weights   09/17/17 1003 09/17/17 2036  Weight: 49.9 kg (110 lb) 53.4 kg (117 lb 11.6 oz)    Examination:  General exam: NAD Respiratory system: Bilateral ronchus diffuse Cardiovascular system: S 1, S 2 RRR Gastrointestinal system; BS present, soft, Central nervous system: non focal.  Extremities: Symmetric power.     Data Reviewed: I have personally reviewed following labs and imaging studies  CBC: Recent Labs  Lab 09/17/17 1007 09/18/17 0927  WBC 7.6 6.1  NEUTROABS  --  5.5  HGB 10.6* 11.2*  HCT 33.1* 34.7*  MCV 93.0 94.3  PLT 196 199   Basic Metabolic Panel: Recent Labs  Lab 09/17/17 1007 09/18/17 0927 09/19/17 0706 09/20/17 1130  NA 136 136 139 140  K 3.7 3.8 3.7 4.5  CL 104 108 112* 107  CO2 21* 16* 19* 22  GLUCOSE 107* 140* 112* 153*  BUN 23* 20 23* 27*  CREATININE 1.08* 0.85 0.86 1.01*  CALCIUM 9.3 8.5* 8.7* 9.5   GFR: Estimated Creatinine Clearance: 25 mL/min (A) (by C-G formula based on SCr of 1.01 mg/dL (H)). Liver Function Tests: Recent Labs  Lab 09/18/17 0927  AST 28  ALT 24  ALKPHOS 75  BILITOT 0.6  PROT 6.3*  ALBUMIN 3.2*   No results for input(s): LIPASE, AMYLASE in the last 168 hours. No results for input(s): AMMONIA in the last 168 hours. Coagulation Profile: No results for input(s): INR, PROTIME in the last 168 hours. Cardiac Enzymes: No  results for input(s): CKTOTAL, CKMB, CKMBINDEX, TROPONINI in the last 168 hours. BNP (last 3 results) No results for input(s): PROBNP in the last 8760 hours. HbA1C: No results for input(s): HGBA1C in the last 72 hours. CBG: Recent Labs  Lab 09/17/17 1054  GLUCAP 108*   Lipid Profile: No results for input(s): CHOL, HDL, LDLCALC, TRIG, CHOLHDL, LDLDIRECT in the last 72 hours. Thyroid Function Tests: No results for input(s): TSH, T4TOTAL, FREET4, T3FREE, THYROIDAB in the last 72 hours. Anemia Panel: No results for input(s): VITAMINB12, FOLATE, FERRITIN, TIBC, IRON, RETICCTPCT in the last 72 hours. Sepsis Labs: Recent Labs  Lab 09/17/17 1607 09/17/17 1627 09/18/17 0927 09/19/17 0706  PROCALCITON  --  0.26 0.21 0.13  LATICACIDVEN 0.7  --   --   --     Recent Results (from the past 240 hour(s))  MRSA PCR Screening     Status: None   Collection Time: 09/17/17  8:58 PM  Result Value Ref Range Status   MRSA by PCR NEGATIVE NEGATIVE Final    Comment:        The GeneXpert MRSA Assay (FDA approved for NASAL specimens only), is one component of a comprehensive MRSA colonization surveillance program. It is not intended to diagnose MRSA infection nor to guide or monitor treatment for MRSA infections.   Respiratory Panel by PCR     Status: Abnormal   Collection Time: 09/18/17  4:54 AM  Result Value Ref Range Status   Adenovirus NOT DETECTED NOT DETECTED Final   Coronavirus 229E NOT DETECTED NOT DETECTED Final   Coronavirus HKU1 NOT DETECTED NOT DETECTED Final   Coronavirus NL63 NOT DETECTED NOT DETECTED Final   Coronavirus OC43 NOT DETECTED NOT DETECTED Final   Metapneumovirus NOT DETECTED NOT DETECTED Final   Rhinovirus / Enterovirus DETECTED (A) NOT DETECTED Final   Influenza A NOT DETECTED NOT DETECTED Final   Influenza B NOT DETECTED NOT DETECTED Final   Parainfluenza Virus 1 NOT DETECTED NOT DETECTED Final   Parainfluenza Virus 2 NOT DETECTED NOT DETECTED Final    Parainfluenza Virus 3 NOT DETECTED NOT DETECTED Final   Parainfluenza Virus 4 NOT DETECTED NOT DETECTED Final   Respiratory Syncytial Virus NOT DETECTED NOT DETECTED Final   Bordetella pertussis NOT DETECTED NOT DETECTED Final   Chlamydophila pneumoniae NOT DETECTED NOT DETECTED Final   Mycoplasma pneumoniae NOT DETECTED NOT DETECTED Final         Radiology Studies: Dg Chest 2 View  Result Date: 09/20/2017 CLINICAL DATA:  Cough and wheezing. EXAM: CHEST  2 VIEW COMPARISON:  CT 09/17/2017.  Chest x-ray 09/17/2017. FINDINGS: Mediastinum hilar structures normal. Stable cardiomegaly with normal pulmonary vascularity. Mild right base subsegmental atelectasis and/or scarring again noted. No pleural effusion or pneumothorax. Interposition of the colon under the right hemidiaphragm present. No acute bony abnormality. IMPRESSION: Stable mild right base atelectasis and/or scarring again and elevation right hemidiaphragm again noted. No acute abnormality identified. 2.  Stable cardiomegaly. Electronically Signed  ByMaisie Fus: Thomas  Register   On: 09/20/2017 12:56        Scheduled Meds: . aspirin  81 mg Oral Daily  . budesonide (PULMICORT) nebulizer solution  0.25 mg Nebulization BID  . calcitonin (salmon)  1 spray Alternating Nares Daily  . calcium carbonate  1 tablet Oral Q breakfast  . enoxaparin (LOVENOX) injection  30 mg Subcutaneous Q24H  . feeding supplement  1 Container Oral TID BM  . guaiFENesin  600 mg Oral BID  . haloperidol lactate  0.5 mg Intravenous Once  . hydrALAZINE  25 mg Oral BID  . ipratropium-albuterol  3 mL Nebulization TID  . ketotifen  2 drop Both Eyes BID  . methylPREDNISolone (SOLU-MEDROL) injection  60 mg Intravenous Q12H  . metoprolol succinate  50 mg Oral Daily  . polyethylene glycol  17 g Oral BID  . senna-docusate  1 tablet Oral BID   Continuous Infusions: . ampicillin-sulbactam (UNASYN) IV Stopped (09/21/17 1121)     LOS: 4 days    Time spent: 35 minutes,      Alba CoryBelkys A Latasia Silberstein, MD [facility] Pager [phone]  If 7PM-7AM, please contact night-coverage [email or web address] Password [email or web address] 09/21/2017, 2:46 PM

## 2017-09-21 NOTE — Clinical Social Work Note (Signed)
Clinical Social Work Assessment  Patient Details  Name: Katherine ShihRachel Prats MRN: [id number] Date of Birth: 06/08/1922  Date of referral:  09/20/17               Reason for consult:  Discharge Planning                Permission sought to share information with:  Facility Medical sales representativeContact Representative, Family Supports Permission granted to share information::  No  Name::     Jillyn HiddenGary  Agency::  [facility]  Relationship::  Son  Contact Information:  [phone]  Housing/Transportation Living arrangements for the past 2 months:  Assisted Living Facility Source of Information:  Adult Children Patient Interpreter Needed:  None Criminal Activity/Legal Involvement Pertinent to Current Situation/Hospitalization:  No - Comment as needed Significant Relationships:  Adult Children Lives with:  Facility Resident Do you feel safe going back to the place where you live?  Yes Need for family participation in patient care:  Yes (Comment)  Care giving concerns:  CSW received consult for regarding discharge planning. CSW spoke with patient's son. He reported that patient resides at [facility] and will return there at discharge. CSW to continue to follow and assist with discharge planning needs.   Social Worker assessment / plan:  CSW spoke with patient's son concerning discharge plan.   Employment status:  Retired Health and safety inspectornsurance information:  Medicare PT Recommendations:  Not assessed at this time Information / Referral to community resources:     Patient/Family's Response to care:  Patient's son expressed agreement with discharge plan and will be bale to take patient back by car.  Patient/Family's Understanding of and Emotional Response to Diagnosis, Current Treatment, and Prognosis:  Patient/family is realistic regarding therapy needs and expressed being hopeful for return to ALF placement. Patient's son expressed understanding of CSW role and discharge process as well as medical condition. No  questions/concerns about plan or treatment.    Emotional Assessment Appearance:  Appears stated age Attitude/Demeanor/Rapport:  Unable to Assess Affect (typically observed):  Unable to Assess Orientation:  Oriented to Self, Oriented to Place, Oriented to Situation Alcohol / Substance use:  Not Applicable Psych involvement (Current and /or in the community):  No (Comment)  Discharge Needs  Concerns to be addressed:  Care Coordination Readmission within the last 30 days:  No Current discharge risk:  None Barriers to Discharge:  Continued Medical Work up   [employer] S Lauree Yurick, LCSWA 09/21/2017, 8:56 AM

## 2017-09-21 NOTE — Evaluation (Signed)
Physical Therapy Evaluation Patient Details Name: Katherine Kirby MRN: [id number] DOB: July 09, 1922 Today's Date: 09/21/2017   History of Present Illness  84 y.o. female with medical history significant for hypertension, osteoporosis with history of prior pelvic fracture as well as spinal compression fractures, and normocytic anemia. Pt recently developed congestion and coughing s/p episdoes of diarrhea and emesis. Pt admitted for HCAP.     Clinical Impression  Pt admitted with above diagnosis. Pt currently with functional limitations due to the deficits listed below (see PT Problem List). On eval, pt required min assist bed mobility, min guard assist transfers, and min guard assist ambulation 10 feet with RW. Pt ambulated on RA with SpO2 94%.  Gait distance limited by increased BP. RN in room to give pt BP meds. Pt will benefit from skilled PT to increase their independence and safety with mobility to allow discharge to the venue listed below.  Pt resides in ALF.      Follow Up Recommendations Home health PT    Equipment Recommendations  None recommended by PT    Recommendations for Other Services       Precautions / Restrictions Precautions Precautions: Fall;Other (comment) Precaution Comments: watch sats      Mobility  Bed Mobility Overal bed mobility: Needs Assistance Bed Mobility: Supine to Sit     Supine to sit: Min assist;HOB elevated     General bed mobility comments: +rail, assist to elevate trunk  Transfers Overall transfer level: Needs assistance Equipment used: Rolling walker (2 wheeled) Transfers: Sit to/from Stand Sit to Stand: Min guard         General transfer comment: cues for hand placement  Ambulation/Gait Ambulation/Gait assistance: Min guard Ambulation Distance (Feet): 10 Feet Assistive device: Rolling walker (2 wheeled) Gait Pattern/deviations: Step-through pattern;Decreased stride length Gait velocity: decreased   General Gait Details: Pt  ambulated on RA with SpO2 94%. BP 210/72. RN in room and aware.  Stairs            Wheelchair Mobility    Modified Rankin (Stroke Patients Only)       Balance Overall balance assessment: Needs assistance Sitting-balance support: No upper extremity supported;Feet supported Sitting balance-Leahy Scale: Good     Standing balance support: Bilateral upper extremity supported;During functional activity Standing balance-Leahy Scale: Poor Standing balance comment: heavy reliance on RW                             Pertinent Vitals/Pain Pain Assessment: No/denies pain    Home Living Family/patient expects to be discharged to:: Assisted living               Home Equipment: Walker - 4 wheels      Prior Function Level of Independence: Needs assistance   Gait / Transfers Assistance Needed: ambulates mod I with rollator  ADL's / Homemaking Assistance Needed: ALF staff assists with ADLs        Hand Dominance        Extremity/Trunk Assessment   Upper Extremity Assessment Upper Extremity Assessment: Generalized weakness    Lower Extremity Assessment Lower Extremity Assessment: Generalized weakness    Cervical / Trunk Assessment Cervical / Trunk Assessment: Kyphotic  Communication   Communication: No difficulties  Cognition Arousal/Alertness: Awake/alert Behavior During Therapy: WFL for tasks assessed/performed Overall Cognitive Status: History of cognitive impairments - at baseline  General Comments: Disoriented to time. Family present to confirm dementia/confusion at baseline.       General Comments      Exercises     Assessment/Plan    PT Assessment Patient needs continued PT services  PT Problem List Decreased strength;Decreased mobility;Decreased activity tolerance;Cardiopulmonary status limiting activity;Decreased balance       PT Treatment Interventions Therapeutic activities;Gait  training;Therapeutic exercise;Patient/family education;Balance training;Functional mobility training    PT Goals (Current goals can be found in the Care Plan section)  Acute Rehab PT Goals Patient Stated Goal: feel better PT Goal Formulation: With patient/family Time For Goal Achievement: 10/05/17 Potential to Achieve Goals: Good    Frequency Min 3X/week   Barriers to discharge        Co-evaluation               AM-PAC PT "6 Clicks" Daily Activity  Outcome Measure Difficulty turning over in bed (including adjusting bedclothes, sheets and blankets)?: A Little Difficulty moving from lying on back to sitting on the side of the bed? : A Lot Difficulty sitting down on and standing up from a chair with arms (e.g., wheelchair, bedside commode, etc,.)?: A Little Help needed moving to and from a bed to chair (including a wheelchair)?: A Little Help needed walking in hospital room?: A Little Help needed climbing 3-5 steps with a railing? : A Little 6 Click Score: 17    End of Session Equipment Utilized During Treatment: Gait belt Activity Tolerance: Patient tolerated treatment well Patient left: in chair;with call bell/phone within reach;with chair alarm set Nurse Communication: Mobility status PT Visit Diagnosis: Muscle weakness (generalized) (M62.81);Unsteadiness on feet (R26.81)    Time: [id number] PT Time Calculation (min) (ACUTE ONLY): 32 min   Charges:   PT Evaluation $PT Eval Low Complexity: 1 Low PT Treatments $Therapeutic Activity: 8-22 mins   PT G Codes:        Aida RaiderWendy Siria Calandro, PT  Office # [phone] Pager [phone]   Ilda FoilGarrow, Kaidence Sant Rene 09/21/2017, 10:43 AM

## 2017-09-21 NOTE — Care Management Note (Signed)
Case Management Note  Patient Details  Name: Gaylord ShihRachel Fontes MRN: [id number] Date of Birth: Jan 26, 1922  Subjective/Objective:  Admitted for Acute hypoxic respiratory failure; related to aspiration PNA, bronchitis and HF exacerbation             Action/Plan: Prior to admission patient lived at [facility] (ALF).  "Osborne Cascoadia" LCSW is following for Assisted Living Facility.   Status of Service:   In process  Yancey FlemingsKimberly R Becton BSN, RN Nurse Case Manager [phone] 09/21/2017, 10:10 AM

## 2017-09-21 NOTE — Progress Notes (Signed)
CSW spoke with [facility], [place]. She states that she is not sure that they would be able to accept patient back until she is off isolation precautions. She will follow up.  Osborne Cascoadia Wolfe Camarena LCSW [phone]

## 2017-09-22 LAB — BASIC METABOLIC PANEL
Anion gap: 10 (ref 5–15)
BUN: 33 mg/dL — AB (ref 6–20)
CALCIUM: 8.9 mg/dL (ref 8.9–10.3)
CO2: 18 mmol/L — ABNORMAL LOW (ref 22–32)
CREATININE: 1.02 mg/dL — AB (ref 0.44–1.00)
Chloride: 107 mmol/L (ref 101–111)
GFR calc Af Amer: 53 mL/min — ABNORMAL LOW (ref >60)
GFR calc non Af Amer: 46 mL/min — ABNORMAL LOW (ref >60)
Glucose, Bld: 137 mg/dL — ABNORMAL HIGH (ref 65–99)
Potassium: 4.4 mmol/L (ref 3.5–5.1)
SODIUM: 135 mmol/L (ref 135–145)

## 2017-09-22 MED ORDER — FLEET ENEMA 7-19 GM/118ML RE ENEM
1.0000 | ENEMA | Freq: Once | RECTAL | Status: AC
  Administered 2017-09-22: 1 via RECTAL
  Filled 2017-09-22: qty 1

## 2017-09-22 NOTE — Progress Notes (Signed)
PROGRESS NOTE    Katherine ShihRachel Kirby  ZOX:[id number] DOB: 10/12/1921 DOA: 09/17/2017 PCP: Lewis Moccasinewey, Elizabeth R, MD    Brief Narrative: Brief summary   84 y.o.femalewith medical history significant forhypertension, osteoporosis with history of prior pelvic fracture as well as spinal compression fractures, and normocytic anemia. In talking with the patient's family this past Saturday on 1/12 patient had episode of diarrhea and emesis but improved after administration of Tylenol and Kaopectate. On 1/13 developed congestion and coughing but cough was nonproductive and low-grade fevers. Today while the patient was sitting at breakfast she fainted and then vomited. Upon awakening the nurse noticed that the patient was somewhat groggy and was unable to swallow initially but this improved after patient returned to baseline. Since this episode patient has become more congested and there was some concern over possible aspiration event.      Assessment & Plan:   Principal Problem:   HCAP (healthcare-associated pneumonia) Active Problems:   Abnormal urinalysis   Respiratory insufficiency   HTN (hypertension)   Acute kidney injury (HCC)   Normocytic anemia  1-Acute hypoxic respiratory failure; related to aspiration PNA, bronchitis and HF exacerbation.  Positive Rhinovirus.  -will schedule nebulizer. Continue with  solumedrol.  -Continue with IV antibiotics.  -Continue with  guaifenesin.  -received one dose of lasix.  -Repeated chest x ray no pulmonary edema.  -continue with pul;micort.  Slowly improving.   2-syncope; ECHO normal EF.  On telemetry.  EKG sinus rhythm.   3-possible UTI; on IV antibiotics.   Metabolic acidosis; improved.   HTN (hypertension). Continue with metoprolol daily .  PRN hydralazine.  will schedule hydralazine to controlled BP.   Normocytic anemia. Hgbstable    DVT prophylaxis: Code Status: DNR Family Communication; discussed with son  Disposition  Plan:   Consultants:   none   Procedures:   ECHO; hypertrophic cardiomyopathy.   Antimicrobials:  unasyn   Subjective: She report some improvement of cough, dyspnea improving slowly. Not at baseline  Objective: Vitals:   09/22/17 0659 09/22/17 0835 09/22/17 1209 09/22/17 1404  BP: (!) 163/81 (!) 170/89  (!) 158/79  Pulse:  84  84  Resp:    20  Temp:    98.6 F (37 C)  TempSrc:    Oral  SpO2:   93% 96%  Weight:      Height:        Intake/Output Summary (Last 24 hours) at 09/22/2017 1521 Last data filed at 09/22/2017 1414 Gross per 24 hour  Intake 590 ml  Output 500 ml  Net 90 ml   Filed Weights   09/17/17 1003 09/17/17 2036  Weight: 49.9 kg (110 lb) 53.4 kg (117 lb 11.6 oz)    Examination:  General exam: NAD Respiratory system: Bilateral ronchus.  Cardiovascular system: S 1, S 2 RRR Gastrointestinal system; BS present, soft, nt Central nervous system: non focal.  Extremities: symmetric power.     Data Reviewed: I have personally reviewed following labs and imaging studies  CBC: Recent Labs  Lab 09/17/17 1007 09/18/17 0927  WBC 7.6 6.1  NEUTROABS  --  5.5  HGB 10.6* 11.2*  HCT 33.1* 34.7*  MCV 93.0 94.3  PLT 196 199   Basic Metabolic Panel: Recent Labs  Lab 09/17/17 1007 09/18/17 0927 09/19/17 0706 09/20/17 1130 09/22/17 0242  NA 136 136 139 140 135  K 3.7 3.8 3.7 4.5 4.4  CL 104 108 112* 107 107  CO2 21* 16* 19* 22 18*  GLUCOSE 107* 140* 112* 153*  137*  BUN 23* 20 23* 27* 33*  CREATININE 1.08* 0.85 0.86 1.01* 1.02*  CALCIUM 9.3 8.5* 8.7* 9.5 8.9   GFR: Estimated Creatinine Clearance: 24.8 mL/min (A) (by C-G formula based on SCr of 1.02 mg/dL (H)). Liver Function Tests: Recent Labs  Lab 09/18/17 0927  AST 28  ALT 24  ALKPHOS 75  BILITOT 0.6  PROT 6.3*  ALBUMIN 3.2*   No results for input(s): LIPASE, AMYLASE in the last 168 hours. No results for input(s): AMMONIA in the last 168 hours. Coagulation Profile: No results for  input(s): INR, PROTIME in the last 168 hours. Cardiac Enzymes: No results for input(s): CKTOTAL, CKMB, CKMBINDEX, TROPONINI in the last 168 hours. BNP (last 3 results) No results for input(s): PROBNP in the last 8760 hours. HbA1C: No results for input(s): HGBA1C in the last 72 hours. CBG: Recent Labs  Lab 09/17/17 1054  GLUCAP 108*   Lipid Profile: No results for input(s): CHOL, HDL, LDLCALC, TRIG, CHOLHDL, LDLDIRECT in the last 72 hours. Thyroid Function Tests: No results for input(s): TSH, T4TOTAL, FREET4, T3FREE, THYROIDAB in the last 72 hours. Anemia Panel: No results for input(s): VITAMINB12, FOLATE, FERRITIN, TIBC, IRON, RETICCTPCT in the last 72 hours. Sepsis Labs: Recent Labs  Lab 09/17/17 1607 09/17/17 1627 09/18/17 0927 09/19/17 0706  PROCALCITON  --  0.26 0.21 0.13  LATICACIDVEN 0.7  --   --   --     Recent Results (from the past 240 hour(s))  MRSA PCR Screening     Status: None   Collection Time: 09/17/17  8:58 PM  Result Value Ref Range Status   MRSA by PCR NEGATIVE NEGATIVE Final    Comment:        The GeneXpert MRSA Assay (FDA approved for NASAL specimens only), is one component of a comprehensive MRSA colonization surveillance program. It is not intended to diagnose MRSA infection nor to guide or monitor treatment for MRSA infections.   Respiratory Panel by PCR     Status: Abnormal   Collection Time: 09/18/17  4:54 AM  Result Value Ref Range Status   Adenovirus NOT DETECTED NOT DETECTED Final   Coronavirus 229E NOT DETECTED NOT DETECTED Final   Coronavirus HKU1 NOT DETECTED NOT DETECTED Final   Coronavirus NL63 NOT DETECTED NOT DETECTED Final   Coronavirus OC43 NOT DETECTED NOT DETECTED Final   Metapneumovirus NOT DETECTED NOT DETECTED Final   Rhinovirus / Enterovirus DETECTED (A) NOT DETECTED Final   Influenza A NOT DETECTED NOT DETECTED Final   Influenza B NOT DETECTED NOT DETECTED Final   Parainfluenza Virus 1 NOT DETECTED NOT DETECTED  Final   Parainfluenza Virus 2 NOT DETECTED NOT DETECTED Final   Parainfluenza Virus 3 NOT DETECTED NOT DETECTED Final   Parainfluenza Virus 4 NOT DETECTED NOT DETECTED Final   Respiratory Syncytial Virus NOT DETECTED NOT DETECTED Final   Bordetella pertussis NOT DETECTED NOT DETECTED Final   Chlamydophila pneumoniae NOT DETECTED NOT DETECTED Final   Mycoplasma pneumoniae NOT DETECTED NOT DETECTED Final         Radiology Studies: No results found.      Scheduled Meds: . aspirin  81 mg Oral Daily  . budesonide (PULMICORT) nebulizer solution  0.25 mg Nebulization BID  . calcitonin (salmon)  1 spray Alternating Nares Daily  . calcium carbonate  1 tablet Oral Q breakfast  . enoxaparin (LOVENOX) injection  30 mg Subcutaneous Q24H  . feeding supplement  1 Container Oral TID BM  . guaiFENesin  600 mg  Oral BID  . haloperidol lactate  0.5 mg Intravenous Once  . hydrALAZINE  25 mg Oral BID  . ipratropium-albuterol  3 mL Nebulization TID  . ketotifen  2 drop Both Eyes BID  . methylPREDNISolone (SOLU-MEDROL) injection  60 mg Intravenous Q12H  . metoprolol succinate  50 mg Oral Daily  . polyethylene glycol  17 g Oral BID  . senna-docusate  1 tablet Oral BID   Continuous Infusions: . ampicillin-sulbactam (UNASYN) IV Stopped (09/22/17 0930)     LOS: 5 days    Time spent: 35 minutes,     Alba Cory, MD [facility] Pager [phone]  If 7PM-7AM, please contact night-coverage [email or web address] Password [email or web address] 09/22/2017, 3:21 PM

## 2017-09-23 MED ORDER — HYDRALAZINE HCL 25 MG PO TABS
25.0000 mg | ORAL_TABLET | Freq: Three times a day (TID) | ORAL | Status: DC
  Administered 2017-09-23 – 2017-09-24 (×3): 25 mg via ORAL
  Filled 2017-09-23 (×2): qty 1

## 2017-09-23 MED ORDER — BISACODYL 5 MG PO TBEC
5.0000 mg | DELAYED_RELEASE_TABLET | Freq: Once | ORAL | Status: AC
  Administered 2017-09-23: 5 mg via ORAL
  Filled 2017-09-23: qty 1

## 2017-09-23 NOTE — Progress Notes (Signed)
PROGRESS NOTE    Katherine Kirby  UJW:[id number] DOB: 02-21-1922 DOA: 09/17/2017 PCP: Lewis Moccasinewey, Elizabeth R, MD    Brief Narrative: Brief summary   84 y.o.femalewith medical history significant forhypertension, osteoporosis with history of prior pelvic fracture as well as spinal compression fractures, and normocytic anemia. In talking with the patient's family this past Saturday on 1/12 patient had episode of diarrhea and emesis but improved after administration of Tylenol and Kaopectate. On 1/13 developed congestion and coughing but cough was nonproductive and low-grade fevers. Today while the patient was sitting at breakfast she fainted and then vomited. Upon awakening the nurse noticed that the patient was somewhat groggy and was unable to swallow initially but this improved after patient returned to baseline. Since this episode patient has become more congested and there was some concern over possible aspiration event.      Assessment & Plan:   Principal Problem:   HCAP (healthcare-associated pneumonia) Active Problems:   Abnormal urinalysis   Respiratory insufficiency   HTN (hypertension)   Acute kidney injury (HCC)   Normocytic anemia  1-Acute hypoxic respiratory failure; related to aspiration PNA, bronchitis and HF exacerbation.  Positive Rhinovirus.  -will schedule nebulizer. Continue with  solumedrol.  -Continue with IV antibiotics.  -Continue with  guaifenesin.  -received one dose of lasix.  -Repeated chest x ray no pulmonary edema.  -continue with pul;micort.  Slowly improving.   2-syncope; ECHO normal EF.  On telemetry.  EKG sinus rhythm.   3-possible UTI; on IV antibiotics.   Metabolic acidosis; improved.   HTN (hypertension). Continue with metoprolol daily .  PRN hydralazine.  will schedule hydralazine to controlled BP.  Change hydralazine to TID.   Normocytic anemia. Hgbstable    DVT prophylaxis: Code Status: DNR Family Communication;  discussed with son  Disposition Plan:   Consultants:   none   Procedures:   ECHO; hypertrophic cardiomyopathy.   Antimicrobials:  unasyn   Subjective: Slowly improving. Report cough less frequent.  She had medium size BM  Objective: Vitals:   09/23/17 0556 09/23/17 0801 09/23/17 1400 09/23/17 1425  BP: (!) 187/85  (!) 151/74   Pulse: 83  90   Resp: 15  (!) 22   Temp: 97.8 F (36.6 C)  (!) 97.4 F (36.3 C)   TempSrc: Oral  Oral   SpO2: 94% 95% 95% 97%  Weight:      Height:        Intake/Output Summary (Last 24 hours) at 09/23/2017 1505 Last data filed at 09/23/2017 1029 Gross per 24 hour  Intake 360 ml  Output 250 ml  Net 110 ml   Filed Weights   09/17/17 1003 09/17/17 2036  Weight: 49.9 kg (110 lb) 53.4 kg (117 lb 11.6 oz)    Examination:  General exam: NAD Respiratory system: Diffuse bilateral ronchus.  Cardiovascular system: S 1, S 2 RRR Gastrointestinal system; BS present,. Soft  Central nervous system: non focal.  Extremities: symmetric power.     Data Reviewed: I have personally reviewed following labs and imaging studies  CBC: Recent Labs  Lab 09/17/17 1007 09/18/17 0927  WBC 7.6 6.1  NEUTROABS  --  5.5  HGB 10.6* 11.2*  HCT 33.1* 34.7*  MCV 93.0 94.3  PLT 196 199   Basic Metabolic Panel: Recent Labs  Lab 09/17/17 1007 09/18/17 0927 09/19/17 0706 09/20/17 1130 09/22/17 0242  NA 136 136 139 140 135  K 3.7 3.8 3.7 4.5 4.4  CL 104 108 112* 107 107  CO2 21*  16* 19* 22 18*  GLUCOSE 107* 140* 112* 153* 137*  BUN 23* 20 23* 27* 33*  CREATININE 1.08* 0.85 0.86 1.01* 1.02*  CALCIUM 9.3 8.5* 8.7* 9.5 8.9   GFR: Estimated Creatinine Clearance: 24.8 mL/min (A) (by C-G formula based on SCr of 1.02 mg/dL (H)). Liver Function Tests: Recent Labs  Lab 09/18/17 0927  AST 28  ALT 24  ALKPHOS 75  BILITOT 0.6  PROT 6.3*  ALBUMIN 3.2*   No results for input(s): LIPASE, AMYLASE in the last 168 hours. No results for input(s): AMMONIA  in the last 168 hours. Coagulation Profile: No results for input(s): INR, PROTIME in the last 168 hours. Cardiac Enzymes: No results for input(s): CKTOTAL, CKMB, CKMBINDEX, TROPONINI in the last 168 hours. BNP (last 3 results) No results for input(s): PROBNP in the last 8760 hours. HbA1C: No results for input(s): HGBA1C in the last 72 hours. CBG: Recent Labs  Lab 09/17/17 1054  GLUCAP 108*   Lipid Profile: No results for input(s): CHOL, HDL, LDLCALC, TRIG, CHOLHDL, LDLDIRECT in the last 72 hours. Thyroid Function Tests: No results for input(s): TSH, T4TOTAL, FREET4, T3FREE, THYROIDAB in the last 72 hours. Anemia Panel: No results for input(s): VITAMINB12, FOLATE, FERRITIN, TIBC, IRON, RETICCTPCT in the last 72 hours. Sepsis Labs: Recent Labs  Lab 09/17/17 1607 09/17/17 1627 09/18/17 0927 09/19/17 0706  PROCALCITON  --  0.26 0.21 0.13  LATICACIDVEN 0.7  --   --   --     Recent Results (from the past 240 hour(s))  MRSA PCR Screening     Status: None   Collection Time: 09/17/17  8:58 PM  Result Value Ref Range Status   MRSA by PCR NEGATIVE NEGATIVE Final    Comment:        The GeneXpert MRSA Assay (FDA approved for NASAL specimens only), is one component of a comprehensive MRSA colonization surveillance program. It is not intended to diagnose MRSA infection nor to guide or monitor treatment for MRSA infections.   Respiratory Panel by PCR     Status: Abnormal   Collection Time: 09/18/17  4:54 AM  Result Value Ref Range Status   Adenovirus NOT DETECTED NOT DETECTED Final   Coronavirus 229E NOT DETECTED NOT DETECTED Final   Coronavirus HKU1 NOT DETECTED NOT DETECTED Final   Coronavirus NL63 NOT DETECTED NOT DETECTED Final   Coronavirus OC43 NOT DETECTED NOT DETECTED Final   Metapneumovirus NOT DETECTED NOT DETECTED Final   Rhinovirus / Enterovirus DETECTED (A) NOT DETECTED Final   Influenza A NOT DETECTED NOT DETECTED Final   Influenza B NOT DETECTED NOT DETECTED  Final   Parainfluenza Virus 1 NOT DETECTED NOT DETECTED Final   Parainfluenza Virus 2 NOT DETECTED NOT DETECTED Final   Parainfluenza Virus 3 NOT DETECTED NOT DETECTED Final   Parainfluenza Virus 4 NOT DETECTED NOT DETECTED Final   Respiratory Syncytial Virus NOT DETECTED NOT DETECTED Final   Bordetella pertussis NOT DETECTED NOT DETECTED Final   Chlamydophila pneumoniae NOT DETECTED NOT DETECTED Final   Mycoplasma pneumoniae NOT DETECTED NOT DETECTED Final         Radiology Studies: No results found.      Scheduled Meds: . aspirin  81 mg Oral Daily  . bisacodyl  5 mg Oral Once  . budesonide (PULMICORT) nebulizer solution  0.25 mg Nebulization BID  . calcitonin (salmon)  1 spray Alternating Nares Daily  . calcium carbonate  1 tablet Oral Q breakfast  . enoxaparin (LOVENOX) injection  30 mg  Subcutaneous Q24H  . feeding supplement  1 Container Oral TID BM  . guaiFENesin  600 mg Oral BID  . haloperidol lactate  0.5 mg Intravenous Once  . hydrALAZINE  25 mg Oral Q8H  . ipratropium-albuterol  3 mL Nebulization TID  . ketotifen  2 drop Both Eyes BID  . methylPREDNISolone (SOLU-MEDROL) injection  60 mg Intravenous Q12H  . metoprolol succinate  50 mg Oral Daily  . polyethylene glycol  17 g Oral BID  . senna-docusate  1 tablet Oral BID   Continuous Infusions: . ampicillin-sulbactam (UNASYN) IV Stopped (09/23/17 0841)     LOS: 6 days    Time spent: 35 minutes,     Alba Cory, MD [facility] Pager [phone]  If 7PM-7AM, please contact night-coverage [email or web address] Password [email or web address] 09/23/2017, 3:05 PM

## 2017-09-24 LAB — BASIC METABOLIC PANEL
Anion gap: 13 (ref 5–15)
BUN: 31 mg/dL — ABNORMAL HIGH (ref 6–20)
CO2: 17 mmol/L — ABNORMAL LOW (ref 22–32)
Calcium: 9.4 mg/dL (ref 8.9–10.3)
Chloride: 104 mmol/L (ref 101–111)
Creatinine, Ser: 1.02 mg/dL — ABNORMAL HIGH (ref 0.44–1.00)
GFR calc Af Amer: 53 mL/min — ABNORMAL LOW (ref >60)
GFR, EST NON AFRICAN AMERICAN: 46 mL/min — AB (ref >60)
GLUCOSE: 85 mg/dL (ref 65–99)
POTASSIUM: 4.4 mmol/L (ref 3.5–5.1)
Sodium: 134 mmol/L — ABNORMAL LOW (ref 135–145)

## 2017-09-24 MED ORDER — PREDNISONE 20 MG PO TABS: 40.0000 mg | ORAL_TABLET | Freq: Every day | ORAL | 0 refills | Status: AC

## 2017-09-24 MED ORDER — BISACODYL 5 MG PO TBEC: 10.0000 mg | DELAYED_RELEASE_TABLET | Freq: Every day | ORAL | 0 refills | Status: AC | PRN

## 2017-09-24 MED ORDER — SENNOSIDES-DOCUSATE SODIUM 8.6-50 MG PO TABS: 1.0000 | ORAL_TABLET | Freq: Two times a day (BID) | ORAL | 0 refills | Status: DC

## 2017-09-24 MED ORDER — SENNOSIDES-DOCUSATE SODIUM 8.6-50 MG PO TABS: 1.0000 | ORAL_TABLET | Freq: Every evening | ORAL | 0 refills | Status: DC | PRN

## 2017-09-24 MED ORDER — GUAIFENESIN ER 600 MG PO TB12: 600.0000 mg | ORAL_TABLET | Freq: Two times a day (BID) | ORAL | 0 refills | Status: DC

## 2017-09-24 MED ORDER — SODIUM BICARBONATE 650 MG PO TABS: 650.0000 mg | ORAL_TABLET | Freq: Two times a day (BID) | ORAL | 0 refills | Status: DC

## 2017-09-24 MED ORDER — POLYETHYLENE GLYCOL 3350 17 G PO PACK: 17.0000 g | PACK | Freq: Two times a day (BID) | ORAL | 0 refills | Status: DC

## 2017-09-24 MED ORDER — POLYETHYLENE GLYCOL 3350 17 G PO PACK: 17.0000 g | PACK | Freq: Every day | ORAL | 0 refills | Status: DC | PRN

## 2017-09-24 MED ORDER — SODIUM BICARBONATE 650 MG PO TABS: 650.0000 mg | ORAL_TABLET | Freq: Two times a day (BID) | ORAL | Status: DC

## 2017-09-24 MED ORDER — HYDRALAZINE HCL 25 MG PO TABS: 25.0000 mg | ORAL_TABLET | Freq: Three times a day (TID) | ORAL | 0 refills | Status: DC

## 2017-09-24 MED ORDER — IPRATROPIUM-ALBUTEROL 0.5-2.5 (3) MG/3ML IN SOLN: 3.0000 mL | Freq: Three times a day (TID) | RESPIRATORY_TRACT | 0 refills | Status: DC

## 2017-09-24 MED ORDER — METOPROLOL SUCCINATE ER 50 MG PO TB24: 50.0000 mg | ORAL_TABLET | Freq: Every day | ORAL | 0 refills | Status: AC

## 2017-09-24 MED ORDER — IPRATROPIUM-ALBUTEROL 0.5-2.5 (3) MG/3ML IN SOLN: 3.0000 mL | Freq: Two times a day (BID) | RESPIRATORY_TRACT | Status: DC

## 2017-09-24 NOTE — Discharge Summary (Addendum)
Physician Discharge Summary  Tatym Schermer ZOX:[id number] DOB: 1921/12/31 DOA: 09/17/2017  PCP: Lewis Moccasin, MD  Admit date: 09/17/2017 Discharge date: 09/24/2017  Admitted From: ALF Disposition:  ALF  Recommendations for Outpatient Follow-up:  1. Follow up with PCP in 1-2 weeks 2. Please obtain BMP/CBC in one week 3. Isolation for 2 to 3 weeks    Home Health: yes  Discharge Condition: Stable.  CODE STATUS: DNR Diet recommendation: Heart Healthy  Brief/Interim Summary:  Brief Narrative: Brief summary  84 y.o.femalewith medical history significant forhypertension, osteoporosis with history of prior pelvic fracture as well as spinal compression fractures, and normocytic anemia. In talking with the patient's family this past Saturday on 1/12 patient had episode of diarrhea and emesis but improved after administration of Tylenol and Kaopectate. On 1/13 developed congestion and coughing but cough was nonproductive and low-grade fevers. Today while the patient was sitting at breakfast she fainted and then vomited. Upon awakening the nurse noticed that the patient was somewhat groggy and was unable to swallow initially but this improved after patient returned to baseline. Since this episode patient has become more congested and there was some concern over possible aspiration event.     Assessment & Plan:   Principal Problem:   HCAP (healthcare-associated pneumonia) Active Problems:   Abnormal urinalysis   Respiratory insufficiency   HTN (hypertension)   Acute kidney injury (HCC)   Normocytic anemia  1-Acute hypoxic respiratory failure; related to aspiration PNA, Acute bronchitis related to Rhinovirus.  And acute on chronic  Diastolic HF exacerbation.  Positive Rhinovirus.  -Treated with schedule nebulizer and solumedrol. Discharge on nebulizer and prednisone.  -treated with 7 days IV antibiotics. finished tx.  -Continue with  guaifenesin.  -received one  dose of lasix.  -Repeated chest x ray no pulmonary edema.  -continue with pul;micort.  Improved.   2-syncope; ECHO normal EF.  On telemetry.  EKG sinus rhythm.   3-Possible UTI; finished treatment.   Metabolic acidosis; fluctuate. Start low sodium bicarb tablet.   HTN (hypertension). Continue with metoprolol daily .  PRN hydralazine.  will schedule hydralazine to controlled BP.  Change hydralazine to TID.   Normocytic anemia.Hgbstable   CKD stage III; cr one year ago at 1.10.  Stable today at 1.02   Discharge Diagnoses:  Principal Problem:   HCAP (healthcare-associated pneumonia) Active Problems:   Abnormal urinalysis   Respiratory insufficiency   HTN (hypertension)   Acute kidney injury (HCC)   Normocytic anemia    Discharge Instructions  Discharge Instructions    Diet - low sodium heart healthy   Complete by:  As directed    Increase activity slowly   Complete by:  As directed      Allergies as of 09/24/2017   No Known Allergies     Medication List    STOP taking these medications   oxyCODONE 5 MG immediate release tablet Commonly known as:  Oxy IR/ROXICODONE     TAKE these medications   acetaminophen 325 MG tablet Commonly known as:  TYLENOL Take 650 mg by mouth every 6 (six) hours as needed (breakthrough pain).   aspirin 81 MG chewable tablet Chew 81 mg by mouth daily.   azelastine 0.05 % ophthalmic solution Commonly known as:  OPTIVAR Apply 1 drop to eye every 12 (twelve) hours as needed (swelling or itching).   bisacodyl 5 MG EC tablet Commonly known as:  DULCOLAX Take 2 tablets (10 mg total) by mouth daily as needed for moderate constipation.  calcitonin (salmon) 200 UNIT/ACT nasal spray Commonly known as:  MIACALCIN/FORTICAL Place 1 spray into alternate nostrils daily.   calcium carbonate 1500 (600 Ca) MG Tabs tablet Commonly known as:  OSCAL Take 1,500 mg by mouth daily.   diclofenac sodium 1 % Gel Commonly known as:   VOLTAREN Apply 2 g topically 4 (four) times daily as needed (shoulder pain).   guaiFENesin 600 MG 12 hr tablet Commonly known as:  MUCINEX Take 1 tablet (600 mg total) by mouth 2 (two) times daily.   hydrALAZINE 25 MG tablet Commonly known as:  APRESOLINE Take 1 tablet (25 mg total) by mouth every 8 (eight) hours.   ipratropium-albuterol 0.5-2.5 (3) MG/3ML Soln Commonly known as:  DUONEB Take 3 mLs by nebulization 3 (three) times daily.   metoprolol succinate 50 MG 24 hr tablet Commonly known as:  TOPROL-XL Take 1 tablet (50 mg total) by mouth daily. Take with or immediately following a meal. Start taking on:  09/25/2017 What changed:    when to take this  additional instructions   polyethylene glycol packet Commonly known as:  MIRALAX / GLYCOLAX Take 17 g by mouth daily as needed.   predniSONE 20 MG tablet Commonly known as:  DELTASONE Take 2 tablets (40 mg total) by mouth daily for 5 days.   senna-docusate 8.6-50 MG tablet Commonly known as:  Senokot-S Take 1 tablet by mouth at bedtime as needed for mild constipation.   sodium bicarbonate 650 MG tablet Take 1 tablet (650 mg total) by mouth 2 (two) times daily.   VITAMIN B 12 PO 100 mg  Take 1 tablet by mouth daily.            Durable Medical Equipment  (From admission, onward)        Start     Ordered   09/21/17 1038  For home use only DME Nebulizer machine  Once    Question:  Patient needs a nebulizer to treat with the following condition  Answer:  Bronchitis   09/21/17 1037      No Known Allergies  Consultations: none  Procedures/Studies: Dg Chest 2 View  Result Date: 09/20/2017 CLINICAL DATA:  Cough and wheezing. EXAM: CHEST  2 VIEW COMPARISON:  CT 09/17/2017.  Chest x-ray 09/17/2017. FINDINGS: Mediastinum hilar structures normal. Stable cardiomegaly with normal pulmonary vascularity. Mild right base subsegmental atelectasis and/or scarring again noted. No pleural effusion or pneumothorax.  Interposition of the colon under the right hemidiaphragm present. No acute bony abnormality. IMPRESSION: Stable mild right base atelectasis and/or scarring again and elevation right hemidiaphragm again noted. No acute abnormality identified. 2.  Stable cardiomegaly. Electronically Signed   By: Maisie Fus  Register   On: 09/20/2017 12:56   Dg Chest 2 View  Result Date: 09/17/2017 CLINICAL DATA:  Cough and chest pain. EXAM: CHEST  2 VIEW COMPARISON:  Chest x-ray dated November 25, 2015. MRI thoracic spine dated October 20, 2015. FINDINGS: The cardiomediastinal silhouette is normal in size. Atherosclerotic calcification of the aortic arch. Normal pulmonary vascularity. Elevation of the right hemidiaphragm. No focal consolidation, pleural effusion, or pneumothorax. New, age-indeterminate moderate compression deformity of T11. Unchanged mild compression deformities of T3, and the superior endplates of T4 and L1. IMPRESSION: 1. No active cardiopulmonary disease. Elevation of the right hemidiaphragm. 2. New age-indeterminate, moderate compression deformity of T11. Correlate with point tenderness. 3.  Aortic atherosclerosis (ICD10-I70.0). Electronically Signed   By: Obie Dredge M.D.   On: 09/17/2017 10:39   Ct Chest Wo Contrast  Result  Date: 09/17/2017 CLINICAL DATA:  On 1/13 developed congestion and coughing but cough was nonproductive and low-grade fevers. Today while the patient was sitting at breakfast she fainted and then vomited. Upon awakening the nurse noticed that the patient was so.*comment was truncated* EXAM: CT CHEST WITHOUT CONTRAST TECHNIQUE: Multidetector CT imaging of the chest was performed following the standard protocol without IV contrast. COMPARISON:  Chest radiograph 1149 FINDINGS: Cardiovascular: Coronary artery calcification and aortic atherosclerotic calcification. Mediastinum/Nodes: No axillary supraclavicular adenopathy. No mediastinal hilar adenopathy. No pericardial fluid Lungs/Pleura:  Bronchial wall thickening in lower lobes. No airspace consolidation. No pulmonary edema. No pneumothorax or nodularity. Upper Abdomen: Limited view of the liver, kidneys, pancreas are unremarkable. Normal adrenal glands. Musculoskeletal: Compression fracture at T11. No peri spinal hematoma to suggest acute fracture. IMPRESSION: 1. Bronchial thickening in lower lobes suggest bronchitis. 2. No evidence pneumonia. 3. Compression fracture at T11 appears remote or subacute Aortic Atherosclerosis (ICD10-I70.0). Electronically Signed   By: Genevive BiStewart  Edmunds M.D.   On: 09/17/2017 17:47      Subjective: She is breathing well, cough improved.   Discharge Exam: Vitals:   09/24/17 0514 09/24/17 0817  BP: (!) 165/92   Pulse: 96   Resp: 16   Temp: 98 F (36.7 C)   SpO2: 97% 99%   Vitals:   09/23/17 2125 09/23/17 2151 09/24/17 0514 09/24/17 0817  BP: (!) 158/91  (!) 165/92   Pulse: 83 90 96   Resp: 14 20 16    Temp: 98.1 F (36.7 C)  98 F (36.7 C)   TempSrc: Oral  Oral   SpO2: 97% 98% 97% 99%  Weight:      Height:        General: Pt is alert, awake, not in acute distress Cardiovascular: RRR, S1/S2 +, no rubs, no gallops Respiratory: less ronchus, less wheezing.  Abdominal: Soft, NT, ND, bowel sounds + Extremities: no edema, no cyanosis    The results of significant diagnostics from this hospitalization (including imaging, microbiology, ancillary and laboratory) are listed below for reference.     Microbiology: Recent Results (from the past 240 hour(s))  MRSA PCR Screening     Status: None   Collection Time: 09/17/17  8:58 PM  Result Value Ref Range Status   MRSA by PCR NEGATIVE NEGATIVE Final    Comment:        The GeneXpert MRSA Assay (FDA approved for NASAL specimens only), is one component of a comprehensive MRSA colonization surveillance program. It is not intended to diagnose MRSA infection nor to guide or monitor treatment for MRSA infections.   Respiratory Panel by  PCR     Status: Abnormal   Collection Time: 09/18/17  4:54 AM  Result Value Ref Range Status   Adenovirus NOT DETECTED NOT DETECTED Final   Coronavirus 229E NOT DETECTED NOT DETECTED Final   Coronavirus HKU1 NOT DETECTED NOT DETECTED Final   Coronavirus NL63 NOT DETECTED NOT DETECTED Final   Coronavirus OC43 NOT DETECTED NOT DETECTED Final   Metapneumovirus NOT DETECTED NOT DETECTED Final   Rhinovirus / Enterovirus DETECTED (A) NOT DETECTED Final   Influenza A NOT DETECTED NOT DETECTED Final   Influenza B NOT DETECTED NOT DETECTED Final   Parainfluenza Virus 1 NOT DETECTED NOT DETECTED Final   Parainfluenza Virus 2 NOT DETECTED NOT DETECTED Final   Parainfluenza Virus 3 NOT DETECTED NOT DETECTED Final   Parainfluenza Virus 4 NOT DETECTED NOT DETECTED Final   Respiratory Syncytial Virus NOT DETECTED NOT DETECTED Final  Bordetella pertussis NOT DETECTED NOT DETECTED Final   Chlamydophila pneumoniae NOT DETECTED NOT DETECTED Final   Mycoplasma pneumoniae NOT DETECTED NOT DETECTED Final     Labs: BNP (last 3 results) Recent Labs    09/17/17 1619  BNP 1,223.5*   Basic Metabolic Panel: Recent Labs  Lab 09/18/17 0927 09/19/17 0706 09/20/17 1130 09/22/17 0242 09/24/17 1139  NA 136 139 140 135 134*  K 3.8 3.7 4.5 4.4 4.4  CL 108 112* 107 107 104  CO2 16* 19* 22 18* 17*  GLUCOSE 140* 112* 153* 137* 85  BUN 20 23* 27* 33* 31*  CREATININE 0.85 0.86 1.01* 1.02* 1.02*  CALCIUM 8.5* 8.7* 9.5 8.9 9.4   Liver Function Tests: Recent Labs  Lab 09/18/17 0927  AST 28  ALT 24  ALKPHOS 75  BILITOT 0.6  PROT 6.3*  ALBUMIN 3.2*   No results for input(s): LIPASE, AMYLASE in the last 168 hours. No results for input(s): AMMONIA in the last 168 hours. CBC: Recent Labs  Lab 09/18/17 0927  WBC 6.1  NEUTROABS 5.5  HGB 11.2*  HCT 34.7*  MCV 94.3  PLT 199   Cardiac Enzymes: No results for input(s): CKTOTAL, CKMB, CKMBINDEX, TROPONINI in the last 168 hours. BNP: Invalid  input(s): POCBNP CBG: No results for input(s): GLUCAP in the last 168 hours. D-Dimer No results for input(s): DDIMER in the last 72 hours. Hgb A1c No results for input(s): HGBA1C in the last 72 hours. Lipid Profile No results for input(s): CHOL, HDL, LDLCALC, TRIG, CHOLHDL, LDLDIRECT in the last 72 hours. Thyroid function studies No results for input(s): TSH, T4TOTAL, T3FREE, THYROIDAB in the last 72 hours.  Invalid input(s): FREET3 Anemia work up No results for input(s): VITAMINB12, FOLATE, FERRITIN, TIBC, IRON, RETICCTPCT in the last 72 hours. Urinalysis    Component Value Date/Time   COLORURINE YELLOW 09/17/2017 1005   APPEARANCEUR HAZY (A) 09/17/2017 1005   LABSPEC 1.011 09/17/2017 1005   PHURINE 6.0 09/17/2017 1005   GLUCOSEU NEGATIVE 09/17/2017 1005   HGBUR SMALL (A) 09/17/2017 1005   BILIRUBINUR NEGATIVE 09/17/2017 1005   KETONESUR NEGATIVE 09/17/2017 1005   PROTEINUR NEGATIVE 09/17/2017 1005   NITRITE NEGATIVE 09/17/2017 1005   LEUKOCYTESUR MODERATE (A) 09/17/2017 1005   Sepsis Labs Invalid input(s): PROCALCITONIN,  WBC,  LACTICIDVEN Microbiology Recent Results (from the past 240 hour(s))  MRSA PCR Screening     Status: None   Collection Time: 09/17/17  8:58 PM  Result Value Ref Range Status   MRSA by PCR NEGATIVE NEGATIVE Final    Comment:        The GeneXpert MRSA Assay (FDA approved for NASAL specimens only), is one component of a comprehensive MRSA colonization surveillance program. It is not intended to diagnose MRSA infection nor to guide or monitor treatment for MRSA infections.   Respiratory Panel by PCR     Status: Abnormal   Collection Time: 09/18/17  4:54 AM  Result Value Ref Range Status   Adenovirus NOT DETECTED NOT DETECTED Final   Coronavirus 229E NOT DETECTED NOT DETECTED Final   Coronavirus HKU1 NOT DETECTED NOT DETECTED Final   Coronavirus NL63 NOT DETECTED NOT DETECTED Final   Coronavirus OC43 NOT DETECTED NOT DETECTED Final    Metapneumovirus NOT DETECTED NOT DETECTED Final   Rhinovirus / Enterovirus DETECTED (A) NOT DETECTED Final   Influenza A NOT DETECTED NOT DETECTED Final   Influenza B NOT DETECTED NOT DETECTED Final   Parainfluenza Virus 1 NOT DETECTED NOT DETECTED Final  Parainfluenza Virus 2 NOT DETECTED NOT DETECTED Final   Parainfluenza Virus 3 NOT DETECTED NOT DETECTED Final   Parainfluenza Virus 4 NOT DETECTED NOT DETECTED Final   Respiratory Syncytial Virus NOT DETECTED NOT DETECTED Final   Bordetella pertussis NOT DETECTED NOT DETECTED Final   Chlamydophila pneumoniae NOT DETECTED NOT DETECTED Final   Mycoplasma pneumoniae NOT DETECTED NOT DETECTED Final     Time coordinating discharge: Over 30 minutes  SIGNED:   Alba Cory, MD  [facility] 09/24/2017, 2:19 PM Pager [phone]  If 7PM-7AM, please contact night-coverage [email or web address] Password [email or web address]

## 2017-09-24 NOTE — [place] FL2 (Signed)
[place] MEDICAID FL2 LEVEL OF CARE SCREENING TOOL     IDENTIFICATION  Patient Name: Katherine Kirby Birthdate: 1921-10-13 Sex: female Admission Date (Current Location): 09/17/2017  [facility] [place]   Facility and Address:  [facility] [place], [address], [place], [place] [address]      Provider Number: [id number]  Attending Physician Name and Address:  Alba Coryegalado, Belkys A, MD  Relative Name and Phone Number:       Current Level of Care: Hospital Recommended Level of Care: Assisted Living Facility Prior Approval Number:    Date Approved/Denied:   PASRR Number:    Discharge Plan: Other (Comment)    Current Diagnoses: Patient Active Problem List   Diagnosis Date Noted  . Abnormal urinalysis 09/17/2017  . Respiratory insufficiency 09/17/2017  . HTN (hypertension) 09/17/2017  . Acute kidney injury (HCC) 09/17/2017  . Normocytic anemia 09/17/2017  . HCAP (healthcare-associated pneumonia) 09/17/2017  . Compression fracture of lumbar spine, non-traumatic (HCC) 10/18/2015  . Lumbar compression fracture (HCC) 10/18/2015  . Spinal stenosis 10/18/2015  . Difficulty walking 10/18/2015  . UTI (lower urinary tract infection) 10/18/2015  . AKI (acute kidney injury) (HCC) 10/18/2015  . Hypertension 10/18/2015  . Hip pain   . Essential hypertension     Orientation RESPIRATION BLADDER Height & Weight     Self, Situation, Place    Incontinent, External catheter Weight: 117 lb 11.6 oz (53.4 kg) Height:  5' (152.4 cm)  BEHAVIORAL SYMPTOMS/MOOD NEUROLOGICAL BOWEL NUTRITION STATUS      Continent Diet(heart healthy; low sodium)  AMBULATORY STATUS COMMUNICATION OF NEEDS Skin   Limited Assist Verbally Normal                       Personal Care Assistance Level of Assistance  Bathing, Feeding, Dressing Bathing Assistance: Limited assistance Feeding assistance: Independent Dressing Assistance: Limited assistance     Functional  Limitations Info  Sight, Hearing Sight Info: Impaired Hearing Info: Impaired      SPECIAL CARE FACTORS FREQUENCY  PT (By licensed PT), OT (By licensed OT)     PT Frequency: 3/wk with home health OT Frequency: 3/wk with home health            Contractures      Additional Factors Info  Isolation Precautions Code Status Info: DNR Allergies Info: NKA     Isolation Precautions Info: Rhinovirus     Current Medications (09/24/2017):  This is the current hospital active medication list Current Facility-Administered Medications  Medication Dose Route Frequency Provider Last Rate Last Dose  . albuterol (PROVENTIL) (2.5 MG/3ML) 0.083% nebulizer solution 2.5 mg  2.5 mg Nebulization Q2H PRN Jonah BlueYates, Jennifer, MD   2.5 mg at 09/22/17 1520  . ampicillin-sulbactam (UNASYN) 1.5 g in sodium chloride 0.9 % 50 mL IVPB  1.5 g Intravenous Q12H Regalado, Belkys A, MD   Stopped at 09/24/17 [id number]  . aspirin chewable tablet 81 mg  81 mg Oral Daily Russella DarEllis, Allison L, NP   81 mg at 09/24/17 0850  . budesonide (PULMICORT) nebulizer solution 0.25 mg  0.25 mg Nebulization BID Regalado, Belkys A, MD   0.25 mg at 09/24/17 0817  . calcitonin (salmon) (MIACALCIN/FORTICAL) nasal spray 1 spray  1 spray Alternating Nares Daily Russella DarEllis, Allison L, NP   1 spray at 09/23/17 1024  . calcium carbonate (OS-CAL - dosed in mg of elemental calcium) tablet 500 mg of elemental calcium  1 tablet Oral Q breakfast Buriev, Ulugbek  N, MD   500 mg of elemental calcium at 09/24/17 0851  . diclofenac sodium (VOLTAREN) 1 % transdermal gel 2 g  2 g Topical QID PRN Russella Dar, NP      . enoxaparin (LOVENOX) injection 30 mg  30 mg Subcutaneous Q24H Regalado, Belkys A, MD   30 mg at 09/23/17 1422  . feeding supplement (BOOST / RESOURCE BREEZE) liquid 1 Container  1 Container Oral TID BM Regalado, Belkys A, MD   1 Container at 09/24/17 0852  . guaiFENesin (MUCINEX) 12 hr tablet 600 mg  600 mg Oral BID Regalado, Belkys A, MD   600 mg at  09/24/17 0851  . haloperidol lactate (HALDOL) injection 0.5 mg  0.5 mg Intravenous Once Kirby-Graham, Beather Arbour, NP      . hydrALAZINE (APRESOLINE) injection 5 mg  5 mg Intravenous Q6H PRN Leda Gauze, NP   5 mg at 09/23/17 0659  . hydrALAZINE (APRESOLINE) tablet 25 mg  25 mg Oral Q8H Regalado, Belkys A, MD   25 mg at 09/24/17 0522  . ipratropium-albuterol (DUONEB) 0.5-2.5 (3) MG/3ML nebulizer solution 3 mL  3 mL Nebulization BID Regalado, Belkys A, MD      . ketotifen (ZADITOR) 0.025 % ophthalmic solution 2 drop  2 drop Both Eyes BID Russella Dar, NP   2 drop at 09/24/17 0851  . LORazepam (ATIVAN) injection 0.25 mg  0.25 mg Intravenous Once PRN Kirby-Graham, Beather Arbour, NP      . methylPREDNISolone sodium succinate (SOLU-MEDROL) 125 mg/2 mL injection 60 mg  60 mg Intravenous Q12H Regalado, Belkys A, MD   60 mg at 09/24/17 1158  . metoprolol succinate (TOPROL-XL) 24 hr tablet 50 mg  50 mg Oral Daily Leda Gauze, NP   50 mg at 09/24/17 0851  . polyethylene glycol (MIRALAX / GLYCOLAX) packet 17 g  17 g Oral BID Regalado, Belkys A, MD   17 g at 09/24/17 0850  . senna-docusate (Senokot-S) tablet 1 tablet  1 tablet Oral BID Regalado, Belkys A, MD   1 tablet at 09/24/17 0851  . sodium bicarbonate tablet 650 mg  650 mg Oral BID Regalado, Belkys A, MD         Discharge Medications: Please see discharge summary for a list of discharge medications.  Relevant Imaging Results:  Relevant Lab Results:   Additional Information SSN: [id number], [place], [place]

## 2017-09-24 NOTE — Progress Notes (Signed)
Patient discharge instructions and prescriptions given to son and daughter in law. Patient discharged to [facility] via wheelchair.

## 2017-09-24 NOTE — Progress Notes (Signed)
CSW sent DC summary and fl2 facility and informed of DC today  Burna SisJenna H. Dayona Shaheen, LCSW Clinical Social Worker [phone]

## 2017-09-26 DIAGNOSIS — M4800 Spinal stenosis, site unspecified: Secondary | ICD-10-CM | POA: Diagnosis not present

## 2017-09-26 DIAGNOSIS — M810 Age-related osteoporosis without current pathological fracture: Secondary | ICD-10-CM | POA: Diagnosis not present

## 2017-09-26 DIAGNOSIS — N183 Chronic kidney disease, stage 3 (moderate): Secondary | ICD-10-CM | POA: Diagnosis not present

## 2017-09-26 DIAGNOSIS — J690 Pneumonitis due to inhalation of food and vomit: Secondary | ICD-10-CM | POA: Diagnosis not present

## 2017-09-26 DIAGNOSIS — I129 Hypertensive chronic kidney disease with stage 1 through stage 4 chronic kidney disease, or unspecified chronic kidney disease: Secondary | ICD-10-CM | POA: Diagnosis not present

## 2017-09-26 DIAGNOSIS — D649 Anemia, unspecified: Secondary | ICD-10-CM | POA: Diagnosis not present

## 2017-09-27 DIAGNOSIS — M810 Age-related osteoporosis without current pathological fracture: Secondary | ICD-10-CM | POA: Diagnosis not present

## 2017-09-27 DIAGNOSIS — I129 Hypertensive chronic kidney disease with stage 1 through stage 4 chronic kidney disease, or unspecified chronic kidney disease: Secondary | ICD-10-CM | POA: Diagnosis not present

## 2017-09-27 DIAGNOSIS — D649 Anemia, unspecified: Secondary | ICD-10-CM | POA: Diagnosis not present

## 2017-09-27 DIAGNOSIS — M4800 Spinal stenosis, site unspecified: Secondary | ICD-10-CM | POA: Diagnosis not present

## 2017-09-27 DIAGNOSIS — J690 Pneumonitis due to inhalation of food and vomit: Secondary | ICD-10-CM | POA: Diagnosis not present

## 2017-09-27 DIAGNOSIS — N183 Chronic kidney disease, stage 3 (moderate): Secondary | ICD-10-CM | POA: Diagnosis not present

## 2017-09-28 DIAGNOSIS — D649 Anemia, unspecified: Secondary | ICD-10-CM | POA: Diagnosis not present

## 2017-09-28 DIAGNOSIS — R7989 Other specified abnormal findings of blood chemistry: Secondary | ICD-10-CM | POA: Diagnosis not present

## 2017-09-28 DIAGNOSIS — J690 Pneumonitis due to inhalation of food and vomit: Secondary | ICD-10-CM | POA: Diagnosis not present

## 2017-09-28 DIAGNOSIS — I129 Hypertensive chronic kidney disease with stage 1 through stage 4 chronic kidney disease, or unspecified chronic kidney disease: Secondary | ICD-10-CM | POA: Diagnosis not present

## 2017-09-28 DIAGNOSIS — M810 Age-related osteoporosis without current pathological fracture: Secondary | ICD-10-CM | POA: Diagnosis not present

## 2017-09-28 DIAGNOSIS — N183 Chronic kidney disease, stage 3 (moderate): Secondary | ICD-10-CM | POA: Diagnosis not present

## 2017-09-28 DIAGNOSIS — M4800 Spinal stenosis, site unspecified: Secondary | ICD-10-CM | POA: Diagnosis not present

## 2017-10-01 DIAGNOSIS — I10 Essential (primary) hypertension: Secondary | ICD-10-CM | POA: Diagnosis not present

## 2017-10-01 DIAGNOSIS — Z6822 Body mass index (BMI) 22.0-22.9, adult: Secondary | ICD-10-CM | POA: Diagnosis not present

## 2017-10-01 DIAGNOSIS — J189 Pneumonia, unspecified organism: Secondary | ICD-10-CM | POA: Diagnosis not present

## 2017-10-01 DIAGNOSIS — I509 Heart failure, unspecified: Secondary | ICD-10-CM | POA: Diagnosis not present

## 2017-10-02 DIAGNOSIS — D649 Anemia, unspecified: Secondary | ICD-10-CM | POA: Diagnosis not present

## 2017-10-02 DIAGNOSIS — M4800 Spinal stenosis, site unspecified: Secondary | ICD-10-CM | POA: Diagnosis not present

## 2017-10-02 DIAGNOSIS — M810 Age-related osteoporosis without current pathological fracture: Secondary | ICD-10-CM | POA: Diagnosis not present

## 2017-10-02 DIAGNOSIS — I129 Hypertensive chronic kidney disease with stage 1 through stage 4 chronic kidney disease, or unspecified chronic kidney disease: Secondary | ICD-10-CM | POA: Diagnosis not present

## 2017-10-02 DIAGNOSIS — J690 Pneumonitis due to inhalation of food and vomit: Secondary | ICD-10-CM | POA: Diagnosis not present

## 2017-10-02 DIAGNOSIS — N183 Chronic kidney disease, stage 3 (moderate): Secondary | ICD-10-CM | POA: Diagnosis not present

## 2017-10-03 DIAGNOSIS — D649 Anemia, unspecified: Secondary | ICD-10-CM | POA: Diagnosis not present

## 2017-10-03 DIAGNOSIS — N183 Chronic kidney disease, stage 3 (moderate): Secondary | ICD-10-CM | POA: Diagnosis not present

## 2017-10-03 DIAGNOSIS — I129 Hypertensive chronic kidney disease with stage 1 through stage 4 chronic kidney disease, or unspecified chronic kidney disease: Secondary | ICD-10-CM | POA: Diagnosis not present

## 2017-10-03 DIAGNOSIS — M810 Age-related osteoporosis without current pathological fracture: Secondary | ICD-10-CM | POA: Diagnosis not present

## 2017-10-03 DIAGNOSIS — M4800 Spinal stenosis, site unspecified: Secondary | ICD-10-CM | POA: Diagnosis not present

## 2017-10-03 DIAGNOSIS — J690 Pneumonitis due to inhalation of food and vomit: Secondary | ICD-10-CM | POA: Diagnosis not present

## 2017-10-04 ENCOUNTER — Other Ambulatory Visit: Payer: Self-pay | Admitting: Family Medicine

## 2017-10-04 ENCOUNTER — Ambulatory Visit
Admission: RE | Admit: 2017-10-04 | Discharge: 2017-10-04 | Disposition: A | Discharge status: Home / Self Care | Payer: Medicare Other | Source: Ambulatory Visit | Attending: Family Medicine | Admitting: Family Medicine

## 2017-10-04 DIAGNOSIS — R0602 Shortness of breath: Secondary | ICD-10-CM

## 2017-10-04 DIAGNOSIS — Z6822 Body mass index (BMI) 22.0-22.9, adult: Secondary | ICD-10-CM | POA: Diagnosis not present

## 2017-10-04 DIAGNOSIS — R29810 Facial weakness: Secondary | ICD-10-CM

## 2017-10-04 DIAGNOSIS — N390 Urinary tract infection, site not specified: Secondary | ICD-10-CM | POA: Diagnosis not present

## 2017-10-04 DIAGNOSIS — H538 Other visual disturbances: Secondary | ICD-10-CM | POA: Diagnosis not present

## 2017-10-04 DIAGNOSIS — J189 Pneumonia, unspecified organism: Secondary | ICD-10-CM | POA: Diagnosis not present

## 2017-10-04 DIAGNOSIS — R509 Fever, unspecified: Secondary | ICD-10-CM | POA: Diagnosis not present

## 2017-10-04 DIAGNOSIS — E871 Hypo-osmolality and hyponatremia: Secondary | ICD-10-CM | POA: Diagnosis not present

## 2017-10-04 DIAGNOSIS — R05 Cough: Secondary | ICD-10-CM | POA: Diagnosis not present

## 2017-10-05 DIAGNOSIS — Z6823 Body mass index (BMI) 23.0-23.9, adult: Secondary | ICD-10-CM | POA: Diagnosis not present

## 2017-10-05 DIAGNOSIS — E871 Hypo-osmolality and hyponatremia: Secondary | ICD-10-CM | POA: Diagnosis not present

## 2017-10-05 DIAGNOSIS — M810 Age-related osteoporosis without current pathological fracture: Secondary | ICD-10-CM | POA: Diagnosis not present

## 2017-10-05 DIAGNOSIS — N183 Chronic kidney disease, stage 3 (moderate): Secondary | ICD-10-CM | POA: Diagnosis not present

## 2017-10-05 DIAGNOSIS — I129 Hypertensive chronic kidney disease with stage 1 through stage 4 chronic kidney disease, or unspecified chronic kidney disease: Secondary | ICD-10-CM | POA: Diagnosis not present

## 2017-10-05 DIAGNOSIS — M4800 Spinal stenosis, site unspecified: Secondary | ICD-10-CM | POA: Diagnosis not present

## 2017-10-05 DIAGNOSIS — N390 Urinary tract infection, site not specified: Secondary | ICD-10-CM | POA: Diagnosis not present

## 2017-10-05 DIAGNOSIS — R509 Fever, unspecified: Secondary | ICD-10-CM | POA: Diagnosis not present

## 2017-10-05 DIAGNOSIS — D649 Anemia, unspecified: Secondary | ICD-10-CM | POA: Diagnosis not present

## 2017-10-05 DIAGNOSIS — J690 Pneumonitis due to inhalation of food and vomit: Secondary | ICD-10-CM | POA: Diagnosis not present

## 2017-10-06 DIAGNOSIS — J690 Pneumonitis due to inhalation of food and vomit: Secondary | ICD-10-CM | POA: Diagnosis not present

## 2017-10-06 DIAGNOSIS — I129 Hypertensive chronic kidney disease with stage 1 through stage 4 chronic kidney disease, or unspecified chronic kidney disease: Secondary | ICD-10-CM | POA: Diagnosis not present

## 2017-10-06 DIAGNOSIS — D649 Anemia, unspecified: Secondary | ICD-10-CM | POA: Diagnosis not present

## 2017-10-06 DIAGNOSIS — M4800 Spinal stenosis, site unspecified: Secondary | ICD-10-CM | POA: Diagnosis not present

## 2017-10-06 DIAGNOSIS — M810 Age-related osteoporosis without current pathological fracture: Secondary | ICD-10-CM | POA: Diagnosis not present

## 2017-10-06 DIAGNOSIS — N183 Chronic kidney disease, stage 3 (moderate): Secondary | ICD-10-CM | POA: Diagnosis not present

## 2017-10-08 DIAGNOSIS — M4800 Spinal stenosis, site unspecified: Secondary | ICD-10-CM | POA: Diagnosis not present

## 2017-10-08 DIAGNOSIS — N183 Chronic kidney disease, stage 3 (moderate): Secondary | ICD-10-CM | POA: Diagnosis not present

## 2017-10-08 DIAGNOSIS — M810 Age-related osteoporosis without current pathological fracture: Secondary | ICD-10-CM | POA: Diagnosis not present

## 2017-10-08 DIAGNOSIS — D649 Anemia, unspecified: Secondary | ICD-10-CM | POA: Diagnosis not present

## 2017-10-08 DIAGNOSIS — I129 Hypertensive chronic kidney disease with stage 1 through stage 4 chronic kidney disease, or unspecified chronic kidney disease: Secondary | ICD-10-CM | POA: Diagnosis not present

## 2017-10-08 DIAGNOSIS — J690 Pneumonitis due to inhalation of food and vomit: Secondary | ICD-10-CM | POA: Diagnosis not present

## 2017-10-09 DIAGNOSIS — M810 Age-related osteoporosis without current pathological fracture: Secondary | ICD-10-CM | POA: Diagnosis not present

## 2017-10-09 DIAGNOSIS — J690 Pneumonitis due to inhalation of food and vomit: Secondary | ICD-10-CM | POA: Diagnosis not present

## 2017-10-09 DIAGNOSIS — D649 Anemia, unspecified: Secondary | ICD-10-CM | POA: Diagnosis not present

## 2017-10-09 DIAGNOSIS — M4800 Spinal stenosis, site unspecified: Secondary | ICD-10-CM | POA: Diagnosis not present

## 2017-10-09 DIAGNOSIS — N183 Chronic kidney disease, stage 3 (moderate): Secondary | ICD-10-CM | POA: Diagnosis not present

## 2017-10-09 DIAGNOSIS — I129 Hypertensive chronic kidney disease with stage 1 through stage 4 chronic kidney disease, or unspecified chronic kidney disease: Secondary | ICD-10-CM | POA: Diagnosis not present

## 2017-10-10 DIAGNOSIS — J690 Pneumonitis due to inhalation of food and vomit: Secondary | ICD-10-CM | POA: Diagnosis not present

## 2017-10-10 DIAGNOSIS — M4800 Spinal stenosis, site unspecified: Secondary | ICD-10-CM | POA: Diagnosis not present

## 2017-10-10 DIAGNOSIS — M810 Age-related osteoporosis without current pathological fracture: Secondary | ICD-10-CM | POA: Diagnosis not present

## 2017-10-10 DIAGNOSIS — D649 Anemia, unspecified: Secondary | ICD-10-CM | POA: Diagnosis not present

## 2017-10-10 DIAGNOSIS — N183 Chronic kidney disease, stage 3 (moderate): Secondary | ICD-10-CM | POA: Diagnosis not present

## 2017-10-10 DIAGNOSIS — I129 Hypertensive chronic kidney disease with stage 1 through stage 4 chronic kidney disease, or unspecified chronic kidney disease: Secondary | ICD-10-CM | POA: Diagnosis not present

## 2017-10-11 DIAGNOSIS — J690 Pneumonitis due to inhalation of food and vomit: Secondary | ICD-10-CM | POA: Diagnosis not present

## 2017-10-11 DIAGNOSIS — M4800 Spinal stenosis, site unspecified: Secondary | ICD-10-CM | POA: Diagnosis not present

## 2017-10-11 DIAGNOSIS — D649 Anemia, unspecified: Secondary | ICD-10-CM | POA: Diagnosis not present

## 2017-10-11 DIAGNOSIS — M810 Age-related osteoporosis without current pathological fracture: Secondary | ICD-10-CM | POA: Diagnosis not present

## 2017-10-11 DIAGNOSIS — R7989 Other specified abnormal findings of blood chemistry: Secondary | ICD-10-CM | POA: Diagnosis not present

## 2017-10-11 DIAGNOSIS — N183 Chronic kidney disease, stage 3 (moderate): Secondary | ICD-10-CM | POA: Diagnosis not present

## 2017-10-11 DIAGNOSIS — I129 Hypertensive chronic kidney disease with stage 1 through stage 4 chronic kidney disease, or unspecified chronic kidney disease: Secondary | ICD-10-CM | POA: Diagnosis not present

## 2017-10-16 DIAGNOSIS — M4800 Spinal stenosis, site unspecified: Secondary | ICD-10-CM | POA: Diagnosis not present

## 2017-10-16 DIAGNOSIS — I129 Hypertensive chronic kidney disease with stage 1 through stage 4 chronic kidney disease, or unspecified chronic kidney disease: Secondary | ICD-10-CM | POA: Diagnosis not present

## 2017-10-16 DIAGNOSIS — J690 Pneumonitis due to inhalation of food and vomit: Secondary | ICD-10-CM | POA: Diagnosis not present

## 2017-10-16 DIAGNOSIS — N183 Chronic kidney disease, stage 3 (moderate): Secondary | ICD-10-CM | POA: Diagnosis not present

## 2017-10-16 DIAGNOSIS — M810 Age-related osteoporosis without current pathological fracture: Secondary | ICD-10-CM | POA: Diagnosis not present

## 2017-10-16 DIAGNOSIS — D649 Anemia, unspecified: Secondary | ICD-10-CM | POA: Diagnosis not present

## 2017-10-17 DIAGNOSIS — M810 Age-related osteoporosis without current pathological fracture: Secondary | ICD-10-CM | POA: Diagnosis not present

## 2017-10-17 DIAGNOSIS — J690 Pneumonitis due to inhalation of food and vomit: Secondary | ICD-10-CM | POA: Diagnosis not present

## 2017-10-17 DIAGNOSIS — M4800 Spinal stenosis, site unspecified: Secondary | ICD-10-CM | POA: Diagnosis not present

## 2017-10-17 DIAGNOSIS — I129 Hypertensive chronic kidney disease with stage 1 through stage 4 chronic kidney disease, or unspecified chronic kidney disease: Secondary | ICD-10-CM | POA: Diagnosis not present

## 2017-10-17 DIAGNOSIS — N183 Chronic kidney disease, stage 3 (moderate): Secondary | ICD-10-CM | POA: Diagnosis not present

## 2017-10-17 DIAGNOSIS — D649 Anemia, unspecified: Secondary | ICD-10-CM | POA: Diagnosis not present

## 2017-10-18 DIAGNOSIS — J690 Pneumonitis due to inhalation of food and vomit: Secondary | ICD-10-CM | POA: Diagnosis not present

## 2017-10-18 DIAGNOSIS — M4800 Spinal stenosis, site unspecified: Secondary | ICD-10-CM | POA: Diagnosis not present

## 2017-10-18 DIAGNOSIS — M810 Age-related osteoporosis without current pathological fracture: Secondary | ICD-10-CM | POA: Diagnosis not present

## 2017-10-18 DIAGNOSIS — I129 Hypertensive chronic kidney disease with stage 1 through stage 4 chronic kidney disease, or unspecified chronic kidney disease: Secondary | ICD-10-CM | POA: Diagnosis not present

## 2017-10-18 DIAGNOSIS — D649 Anemia, unspecified: Secondary | ICD-10-CM | POA: Diagnosis not present

## 2017-10-18 DIAGNOSIS — N183 Chronic kidney disease, stage 3 (moderate): Secondary | ICD-10-CM | POA: Diagnosis not present

## 2017-10-22 DIAGNOSIS — J329 Chronic sinusitis, unspecified: Secondary | ICD-10-CM | POA: Diagnosis not present

## 2017-10-22 DIAGNOSIS — Z6822 Body mass index (BMI) 22.0-22.9, adult: Secondary | ICD-10-CM | POA: Diagnosis not present

## 2017-10-22 DIAGNOSIS — I10 Essential (primary) hypertension: Secondary | ICD-10-CM | POA: Diagnosis not present

## 2017-10-22 DIAGNOSIS — N390 Urinary tract infection, site not specified: Secondary | ICD-10-CM | POA: Diagnosis not present

## 2017-10-23 DIAGNOSIS — N183 Chronic kidney disease, stage 3 (moderate): Secondary | ICD-10-CM | POA: Diagnosis not present

## 2017-10-23 DIAGNOSIS — M810 Age-related osteoporosis without current pathological fracture: Secondary | ICD-10-CM | POA: Diagnosis not present

## 2017-10-23 DIAGNOSIS — M4800 Spinal stenosis, site unspecified: Secondary | ICD-10-CM | POA: Diagnosis not present

## 2017-10-23 DIAGNOSIS — J690 Pneumonitis due to inhalation of food and vomit: Secondary | ICD-10-CM | POA: Diagnosis not present

## 2017-10-23 DIAGNOSIS — I129 Hypertensive chronic kidney disease with stage 1 through stage 4 chronic kidney disease, or unspecified chronic kidney disease: Secondary | ICD-10-CM | POA: Diagnosis not present

## 2017-10-23 DIAGNOSIS — D649 Anemia, unspecified: Secondary | ICD-10-CM | POA: Diagnosis not present

## 2017-10-25 DIAGNOSIS — M810 Age-related osteoporosis without current pathological fracture: Secondary | ICD-10-CM | POA: Diagnosis not present

## 2017-10-25 DIAGNOSIS — N183 Chronic kidney disease, stage 3 (moderate): Secondary | ICD-10-CM | POA: Diagnosis not present

## 2017-10-25 DIAGNOSIS — I129 Hypertensive chronic kidney disease with stage 1 through stage 4 chronic kidney disease, or unspecified chronic kidney disease: Secondary | ICD-10-CM | POA: Diagnosis not present

## 2017-10-25 DIAGNOSIS — D649 Anemia, unspecified: Secondary | ICD-10-CM | POA: Diagnosis not present

## 2017-10-25 DIAGNOSIS — J690 Pneumonitis due to inhalation of food and vomit: Secondary | ICD-10-CM | POA: Diagnosis not present

## 2017-10-25 DIAGNOSIS — M4800 Spinal stenosis, site unspecified: Secondary | ICD-10-CM | POA: Diagnosis not present

## 2017-10-29 DIAGNOSIS — N183 Chronic kidney disease, stage 3 (moderate): Secondary | ICD-10-CM | POA: Diagnosis not present

## 2017-10-29 DIAGNOSIS — I129 Hypertensive chronic kidney disease with stage 1 through stage 4 chronic kidney disease, or unspecified chronic kidney disease: Secondary | ICD-10-CM | POA: Diagnosis not present

## 2017-10-29 DIAGNOSIS — M810 Age-related osteoporosis without current pathological fracture: Secondary | ICD-10-CM | POA: Diagnosis not present

## 2017-10-29 DIAGNOSIS — M4800 Spinal stenosis, site unspecified: Secondary | ICD-10-CM | POA: Diagnosis not present

## 2017-10-29 DIAGNOSIS — J690 Pneumonitis due to inhalation of food and vomit: Secondary | ICD-10-CM | POA: Diagnosis not present

## 2017-10-29 DIAGNOSIS — D649 Anemia, unspecified: Secondary | ICD-10-CM | POA: Diagnosis not present

## 2017-11-03 ENCOUNTER — Emergency Department (HOSPITAL_COMMUNITY): Payer: Medicare Other

## 2017-11-03 ENCOUNTER — Encounter (HOSPITAL_COMMUNITY): Payer: Self-pay | Admitting: Nurse Practitioner

## 2017-11-03 ENCOUNTER — Other Ambulatory Visit: Payer: Self-pay

## 2017-11-03 ENCOUNTER — Inpatient Hospital Stay (HOSPITAL_COMMUNITY)
Admission: EM | Admit: 2017-11-03 | Discharge: 2017-11-05 | DRG: 689 | Disposition: A | Discharge status: Nursing Home (Includes ICF) | Payer: Medicare Other | Attending: Internal Medicine | Admitting: Internal Medicine

## 2017-11-03 DIAGNOSIS — N183 Chronic kidney disease, stage 3 unspecified: Secondary | ICD-10-CM | POA: Diagnosis present

## 2017-11-03 DIAGNOSIS — R509 Fever, unspecified: Secondary | ICD-10-CM

## 2017-11-03 DIAGNOSIS — Z7982 Long term (current) use of aspirin: Secondary | ICD-10-CM

## 2017-11-03 DIAGNOSIS — D638 Anemia in other chronic diseases classified elsewhere: Secondary | ICD-10-CM | POA: Diagnosis present

## 2017-11-03 DIAGNOSIS — D649 Anemia, unspecified: Secondary | ICD-10-CM | POA: Diagnosis present

## 2017-11-03 DIAGNOSIS — M4856XA Collapsed vertebra, not elsewhere classified, lumbar region, initial encounter for fracture: Secondary | ICD-10-CM | POA: Diagnosis present

## 2017-11-03 DIAGNOSIS — K5900 Constipation, unspecified: Secondary | ICD-10-CM | POA: Diagnosis not present

## 2017-11-03 DIAGNOSIS — M4854XA Collapsed vertebra, not elsewhere classified, thoracic region, initial encounter for fracture: Secondary | ICD-10-CM | POA: Diagnosis present

## 2017-11-03 DIAGNOSIS — F0390 Unspecified dementia without behavioral disturbance: Secondary | ICD-10-CM | POA: Diagnosis not present

## 2017-11-03 DIAGNOSIS — R1084 Generalized abdominal pain: Secondary | ICD-10-CM | POA: Diagnosis not present

## 2017-11-03 DIAGNOSIS — M810 Age-related osteoporosis without current pathological fracture: Secondary | ICD-10-CM | POA: Diagnosis present

## 2017-11-03 DIAGNOSIS — B9689 Other specified bacterial agents as the cause of diseases classified elsewhere: Secondary | ICD-10-CM | POA: Diagnosis present

## 2017-11-03 DIAGNOSIS — N3000 Acute cystitis without hematuria: Secondary | ICD-10-CM | POA: Diagnosis not present

## 2017-11-03 DIAGNOSIS — Z79899 Other long term (current) drug therapy: Secondary | ICD-10-CM

## 2017-11-03 DIAGNOSIS — E876 Hypokalemia: Secondary | ICD-10-CM | POA: Diagnosis present

## 2017-11-03 DIAGNOSIS — I129 Hypertensive chronic kidney disease with stage 1 through stage 4 chronic kidney disease, or unspecified chronic kidney disease: Secondary | ICD-10-CM | POA: Diagnosis present

## 2017-11-03 DIAGNOSIS — N390 Urinary tract infection, site not specified: Secondary | ICD-10-CM | POA: Diagnosis not present

## 2017-11-03 DIAGNOSIS — S22000A Wedge compression fracture of unspecified thoracic vertebra, initial encounter for closed fracture: Secondary | ICD-10-CM | POA: Diagnosis present

## 2017-11-03 DIAGNOSIS — Z96642 Presence of left artificial hip joint: Secondary | ICD-10-CM | POA: Diagnosis present

## 2017-11-03 DIAGNOSIS — G9341 Metabolic encephalopathy: Secondary | ICD-10-CM | POA: Diagnosis present

## 2017-11-03 DIAGNOSIS — R079 Chest pain, unspecified: Secondary | ICD-10-CM | POA: Diagnosis not present

## 2017-11-03 DIAGNOSIS — F03A0 Unspecified dementia, mild, without behavioral disturbance, psychotic disturbance, mood disturbance, and anxiety: Secondary | ICD-10-CM | POA: Diagnosis present

## 2017-11-03 DIAGNOSIS — Z66 Do not resuscitate: Secondary | ICD-10-CM | POA: Diagnosis present

## 2017-11-03 DIAGNOSIS — K5730 Diverticulosis of large intestine without perforation or abscess without bleeding: Secondary | ICD-10-CM | POA: Diagnosis not present

## 2017-11-03 DIAGNOSIS — R410 Disorientation, unspecified: Secondary | ICD-10-CM

## 2017-11-03 DIAGNOSIS — I10 Essential (primary) hypertension: Secondary | ICD-10-CM | POA: Diagnosis present

## 2017-11-03 HISTORY — DX: Chronic kidney disease, stage 3 (moderate): N18.3

## 2017-11-03 HISTORY — DX: Wedge compression fracture of unspecified thoracic vertebra, initial encounter for closed fracture: S22.000A

## 2017-11-03 HISTORY — DX: Chronic kidney disease, stage 3 unspecified: N18.30

## 2017-11-03 HISTORY — DX: Wedge compression fracture of unspecified lumbar vertebra, initial encounter for closed fracture: S32.000A

## 2017-11-03 HISTORY — DX: Unspecified dementia, unspecified severity, without behavioral disturbance, psychotic disturbance, mood disturbance, and anxiety: F03.90

## 2017-11-03 LAB — COMPREHENSIVE METABOLIC PANEL
ALBUMIN: 3 g/dL — AB (ref 3.5–5.0)
ALK PHOS: 58 U/L (ref 38–126)
ALT: 10 U/L — AB (ref 14–54)
AST: 22 U/L (ref 15–41)
Anion gap: 12 (ref 5–15)
BILIRUBIN TOTAL: 0.9 mg/dL (ref 0.3–1.2)
BUN: 16 mg/dL (ref 6–20)
CALCIUM: 8.7 mg/dL — AB (ref 8.9–10.3)
CO2: 20 mmol/L — ABNORMAL LOW (ref 22–32)
CREATININE: 0.98 mg/dL (ref 0.44–1.00)
Chloride: 101 mmol/L (ref 101–111)
GFR calc Af Amer: 56 mL/min — ABNORMAL LOW (ref >60)
GFR calc non Af Amer: 48 mL/min — ABNORMAL LOW (ref >60)
GLUCOSE: 116 mg/dL — AB (ref 65–99)
Potassium: 3.6 mmol/L (ref 3.5–5.1)
Sodium: 133 mmol/L — ABNORMAL LOW (ref 135–145)
TOTAL PROTEIN: 5.6 g/dL — AB (ref 6.5–8.1)

## 2017-11-03 LAB — URINALYSIS, ROUTINE W REFLEX MICROSCOPIC
Bilirubin Urine: NEGATIVE
Glucose, UA: NEGATIVE mg/dL
KETONES UR: NEGATIVE mg/dL
Nitrite: NEGATIVE
PH: 5 (ref 5.0–8.0)
Protein, ur: NEGATIVE mg/dL
SQUAMOUS EPITHELIAL / LPF: NONE SEEN
Specific Gravity, Urine: 1.011 (ref 1.005–1.030)

## 2017-11-03 LAB — CBC WITH DIFFERENTIAL/PLATELET
BASOS ABS: 0 10*3/uL (ref 0.0–0.1)
Basophils Relative: 0 %
EOS PCT: 0 %
Eosinophils Absolute: 0 10*3/uL (ref 0.0–0.7)
HCT: 26.9 % — ABNORMAL LOW (ref 36.0–46.0)
Hemoglobin: 8.9 g/dL — ABNORMAL LOW (ref 12.0–15.0)
LYMPHS PCT: 9 %
Lymphs Abs: 0.6 10*3/uL — ABNORMAL LOW (ref 0.7–4.0)
MCH: 30 pg (ref 26.0–34.0)
MCHC: 33.1 g/dL (ref 30.0–36.0)
MCV: 90.6 fL (ref 78.0–100.0)
MONO ABS: 0.6 10*3/uL (ref 0.1–1.0)
Monocytes Relative: 8 %
Neutro Abs: 6.1 10*3/uL (ref 1.7–7.7)
Neutrophils Relative %: 83 %
PLATELETS: 199 10*3/uL (ref 150–400)
RBC: 2.97 MIL/uL — ABNORMAL LOW (ref 3.87–5.11)
RDW: 15 % (ref 11.5–15.5)
WBC: 7.4 10*3/uL (ref 4.0–10.5)

## 2017-11-03 LAB — I-STAT CG4 LACTIC ACID, ED
LACTIC ACID, VENOUS: 0.73 mmol/L (ref 0.5–1.9)
Lactic Acid, Venous: 1.14 mmol/L (ref 0.5–1.9)

## 2017-11-03 LAB — INFLUENZA PANEL BY PCR (TYPE A & B)
Influenza A By PCR: NEGATIVE
Influenza B By PCR: NEGATIVE

## 2017-11-03 LAB — POC OCCULT BLOOD, ED: FECAL OCCULT BLD: NEGATIVE

## 2017-11-03 MED ORDER — ACETAMINOPHEN 325 MG PO TABS: 650.0000 mg | ORAL_TABLET | Freq: Four times a day (QID) | ORAL | Status: DC | PRN

## 2017-11-03 MED ORDER — CALCITONIN (SALMON) 200 UNIT/ACT NA SOLN
1.0000 | Freq: Every day | NASAL | Status: DC
  Administered 2017-11-04 – 2017-11-05 (×2): 1 via NASAL
  Filled 2017-11-03 (×2): qty 3.7

## 2017-11-03 MED ORDER — ACETAMINOPHEN 650 MG RE SUPP: 650.0000 mg | Freq: Four times a day (QID) | RECTAL | Status: DC | PRN

## 2017-11-03 MED ORDER — ONDANSETRON HCL 4 MG PO TABS: 4.0000 mg | ORAL_TABLET | Freq: Four times a day (QID) | ORAL | Status: DC | PRN

## 2017-11-03 MED ORDER — ONDANSETRON HCL 4 MG/2ML IJ SOLN: 4.0000 mg | Freq: Four times a day (QID) | INTRAMUSCULAR | Status: DC | PRN

## 2017-11-03 MED ORDER — FLEET ENEMA 7-19 GM/118ML RE ENEM: 1.0000 | ENEMA | Freq: Every day | RECTAL | Status: DC | PRN

## 2017-11-03 MED ORDER — METOPROLOL TARTRATE 5 MG/5ML IV SOLN
5.0000 mg | Freq: Four times a day (QID) | INTRAVENOUS | Status: DC
  Administered 2017-11-03 – 2017-11-05 (×6): 5 mg via INTRAVENOUS
  Filled 2017-11-03 (×7): qty 5

## 2017-11-03 MED ORDER — SODIUM CHLORIDE 0.9 % IV BOLUS (SEPSIS)
500.0000 mL | Freq: Once | INTRAVENOUS | Status: AC
  Administered 2017-11-03: 500 mL via INTRAVENOUS

## 2017-11-03 MED ORDER — SODIUM CHLORIDE 0.9 % IV SOLN
1.0000 g | Freq: Once | INTRAVENOUS | Status: AC
  Administered 2017-11-03: 1 g via INTRAVENOUS
  Filled 2017-11-03: qty 10

## 2017-11-03 MED ORDER — SODIUM CHLORIDE 0.9% FLUSH: 3.0000 mL | Freq: Two times a day (BID) | INTRAVENOUS | Status: DC

## 2017-11-03 MED ORDER — POLYETHYLENE GLYCOL 3350 17 G PO PACK: 17.0000 g | PACK | Freq: Every day | ORAL | Status: DC | PRN

## 2017-11-03 MED ORDER — HYDRALAZINE HCL 20 MG/ML IJ SOLN: 10.0000 mg | INTRAMUSCULAR | Status: DC | PRN

## 2017-11-03 MED ORDER — IPRATROPIUM-ALBUTEROL 0.5-2.5 (3) MG/3ML IN SOLN: 3.0000 mL | Freq: Four times a day (QID) | RESPIRATORY_TRACT | Status: DC | PRN

## 2017-11-03 MED ORDER — ASPIRIN 81 MG PO CHEW
81.0000 mg | CHEWABLE_TABLET | Freq: Every day | ORAL | Status: DC
  Administered 2017-11-03 – 2017-11-05 (×3): 81 mg via ORAL
  Filled 2017-11-03 (×3): qty 1

## 2017-11-03 MED ORDER — BISACODYL 10 MG RE SUPP
10.0000 mg | Freq: Once | RECTAL | Status: AC
  Administered 2017-11-03: 10 mg via RECTAL
  Filled 2017-11-03: qty 1

## 2017-11-03 MED ORDER — SODIUM CHLORIDE 0.9 % IV SOLN
1.0000 g | INTRAVENOUS | Status: DC
  Administered 2017-11-04: 1 g via INTRAVENOUS
  Filled 2017-11-03 (×2): qty 10

## 2017-11-03 MED ORDER — ENOXAPARIN SODIUM 30 MG/0.3ML ~~LOC~~ SOLN
30.0000 mg | SUBCUTANEOUS | Status: DC
  Administered 2017-11-03 – 2017-11-04 (×2): 30 mg via SUBCUTANEOUS
  Filled 2017-11-03 (×2): qty 0.3

## 2017-11-03 MED ORDER — SODIUM CHLORIDE 0.9 % IV SOLN
INTRAVENOUS | Status: DC
  Administered 2017-11-03 – 2017-11-05 (×3): via INTRAVENOUS

## 2017-11-03 MED ORDER — IOPAMIDOL (ISOVUE-300) INJECTION 61%
INTRAVENOUS | Status: AC
  Administered 2017-11-03: 80 mL via INTRAVENOUS
  Filled 2017-11-03: qty 100

## 2017-11-03 MED ORDER — SENNOSIDES-DOCUSATE SODIUM 8.6-50 MG PO TABS: 1.0000 | ORAL_TABLET | Freq: Every evening | ORAL | Status: DC | PRN

## 2017-11-03 MED ORDER — ACETAMINOPHEN 500 MG PO TABS
1000.0000 mg | ORAL_TABLET | Freq: Once | ORAL | Status: AC
  Administered 2017-11-03: 1000 mg via ORAL
  Filled 2017-11-03: qty 2

## 2017-11-03 MED ORDER — HYDRALAZINE HCL 25 MG PO TABS
25.0000 mg | ORAL_TABLET | Freq: Two times a day (BID) | ORAL | Status: DC
  Administered 2017-11-03 – 2017-11-05 (×4): 25 mg via ORAL
  Filled 2017-11-03 (×5): qty 1

## 2017-11-03 NOTE — ED Provider Notes (Signed)
[facility] EMERGENCY DEPARTMENT Provider Note   CSN: [id number] Arrival date & time: 11/03/17  0944     History   Chief Complaint Chief Complaint  Patient presents with  . Chest Pain    HPI Lyndsie Wallman is a 84 y.o. female.  Patient is a 84 year old female with a history of hypertension and dementia who lives in an assisted living facility.  She presents with reported chest pain.  She had been complaining to the staff that she was having some chest pain since this morning.  She was recently admitted last month for pneumonia ( per chart review, tested postitive for rhinovirus).  She was noted here in the emergency department to have a fever.  The family has not really noted any coughing.  No vomiting or diarrhea.  History is limited due to the patient's dementia.  Family states that she has baseline confusion but seems more withdrawn and less communicative today.      Past Medical History:  Diagnosis Date  . Anemia   . Hypertension   . Pelvis fracture [facility])     Patient Active Problem List   Diagnosis Date Noted  . Acute metabolic encephalopathy 11/03/2017  . Abnormal urinalysis 09/17/2017  . Respiratory insufficiency 09/17/2017  . HTN (hypertension) 09/17/2017  . Acute kidney injury (HCC) 09/17/2017  . Normocytic anemia 09/17/2017  . HCAP (healthcare-associated pneumonia) 09/17/2017  . Compression fracture of lumbar spine, non-traumatic (HCC) 10/18/2015  . Lumbar compression fracture (HCC) 10/18/2015  . Spinal stenosis 10/18/2015  . Difficulty walking 10/18/2015  . UTI (lower urinary tract infection) 10/18/2015  . AKI (acute kidney injury) (HCC) 10/18/2015  . Hypertension 10/18/2015  . Hip pain   . Essential hypertension     Past Surgical History:  Procedure Laterality Date  . TOTAL HIP ARTHROPLASTY Left 2007    OB History    No data available       Home Medications    Prior to Admission medications   Medication Sig Start Date End  Date Taking? Authorizing Provider  albuterol (PROVENTIL) (2.5 MG/3ML) 0.083% nebulizer solution Take 2.5 mg by nebulization 3 (three) times daily.   Yes [provider]  aspirin 81 MG chewable tablet Chew 81 mg by mouth daily.   Yes [provider]  bisacodyl (DULCOLAX) 5 MG EC tablet Take 2 tablets (10 mg total) by mouth daily as needed for moderate constipation. 09/24/17  Yes Regalado, Belkys A, MD  calcitonin, salmon, (MIACALCIN/FORTICAL) 200 UNIT/ACT nasal spray Place 1 spray into alternate nostrils See admin instructions. Spray 1 spray into Right nostril every other day then alternating with left nostril   Yes [provider]  Cyanocobalamin (VITAMIN B 12) 100 MCG LOZG Take 100 mcg by mouth daily.    Yes [provider]  hydrALAZINE (APRESOLINE) 25 MG tablet Take 1 tablet (25 mg total) by mouth every 8 (eight) hours. Patient taking differently: Take 25 mg by mouth 2 (two) times daily.  09/24/17  Yes Regalado, Belkys A, MD  ipratropium-albuterol (DUONEB) 0.5-2.5 (3) MG/3ML SOLN Take 3 mLs by nebulization 3 (three) times daily. 09/24/17  Yes Regalado, Belkys A, MD  metoprolol succinate (TOPROL-XL) 50 MG 24 hr tablet Take 1 tablet (50 mg total) by mouth daily. Take with or immediately following a meal. 09/25/17  Yes Regalado, Belkys A, MD  polyethylene glycol (MIRALAX / GLYCOLAX) packet Take 17 g by mouth daily as needed. 09/24/17  Yes Regalado, Belkys A, MD  senna-docusate (SENOKOT-S) 8.6-50 MG tablet Take  1 tablet by mouth at bedtime as needed for mild constipation. 09/24/17  Yes Regalado, Belkys A, MD  guaiFENesin (MUCINEX) 600 MG 12 hr tablet Take 1 tablet (600 mg total) by mouth 2 (two) times daily. Patient not taking: Reported on 11/03/2017 09/24/17   Regalado, Jon Billings A, MD  sodium bicarbonate 650 MG tablet Take 1 tablet (650 mg total) by mouth 2 (two) times daily. Patient not taking: Reported on 11/03/2017 09/24/17   Alba Cory, MD    Family History No  family history on file.  Social History Social History   Tobacco Use  . Smoking status: Never Smoker  . Smokeless tobacco: Never Used  Substance Use Topics  . Alcohol use: Yes    Comment: ocasional  . Drug use: No     Allergies   Patient has no known allergies.   Review of Systems Review of Systems  Unable to perform ROS: Dementia     Physical Exam Updated Vital Signs BP (!) 152/75   Pulse 87   Temp (!) 101 F (38.3 C) (Rectal)   Resp 13   SpO2 95%   Physical Exam  Constitutional: She appears well-developed and well-nourished.  HENT:  Head: Normocephalic and atraumatic.  Eyes: Pupils are equal, round, and reactive to light.  Neck: Normal range of motion. Neck supple.  Cardiovascular: Normal rate, regular rhythm and normal heart sounds.  Pulmonary/Chest: Effort normal and breath sounds normal. No respiratory distress. She has no wheezes. She has no rales. She exhibits no tenderness.  Abdominal: Soft. Bowel sounds are normal. There is no tenderness. There is no rebound and no guarding.  Musculoskeletal: Normal range of motion. She exhibits no edema.  Lymphadenopathy:    She has no cervical adenopathy.  Neurological: She is alert.  Patient is alert with eyes open and will answer simple questions but is not oriented.  She is moving all extremities symmetrically without focal deficits  Skin: Skin is warm and dry. No rash noted.  Psychiatric: She has a normal mood and affect.     ED Treatments / Results  Labs (all labs ordered are listed, but only abnormal results are displayed) Labs Reviewed  COMPREHENSIVE METABOLIC PANEL - Abnormal; Notable for the following components:      Result Value   Sodium 133 (*)    CO2 20 (*)    Glucose, Bld 116 (*)    Calcium 8.7 (*)    Total Protein 5.6 (*)    Albumin 3.0 (*)    ALT 10 (*)    GFR calc non Af Amer 48 (*)    GFR calc Af Amer 56 (*)    All other components within normal limits  CBC WITH DIFFERENTIAL/PLATELET -  Abnormal; Notable for the following components:   RBC 2.97 (*)    Hemoglobin 8.9 (*)    HCT 26.9 (*)    Lymphs Abs 0.6 (*)    All other components within normal limits  URINALYSIS, ROUTINE W REFLEX MICROSCOPIC - Abnormal; Notable for the following components:   Hgb urine dipstick SMALL (*)    Leukocytes, UA TRACE (*)    Bacteria, UA MANY (*)    All other components within normal limits  CULTURE, BLOOD (ROUTINE X 2)  CULTURE, BLOOD (ROUTINE X 2)  URINE CULTURE  INFLUENZA PANEL BY PCR (TYPE A & B)  I-STAT CG4 LACTIC ACID, ED  I-STAT CG4 LACTIC ACID, ED  POC OCCULT BLOOD, ED    EKG  EKG Interpretation  Date/Time:  Saturday November 03 2017 09:53:13 EST Ventricular Rate:  98 PR Interval:    QRS Duration: 87 QT Interval:  366 QTC Calculation: 468 R Axis:   -36 Text Interpretation:  Sinus rhythm Probable left atrial enlargement Left axis deviation since last tracing no significant change Confirmed by Rolan Bucco [phone]) on 11/03/2017 9:58:36 AM       Radiology Ct Abdomen Pelvis W Contrast  Result Date: 11/03/2017 CLINICAL DATA:  Patient awoke approx 2am with chest pain and then at breakfast staff noticed she was not feeling good. Initial B/P was 250/110 and reverified at 225/108. Complains now of epigastric pain. EXAM: CT ABDOMEN AND PELVIS WITH CONTRAST TECHNIQUE: Multidetector CT imaging of the abdomen and pelvis was performed using the standard protocol following bolus administration of intravenous contrast. CONTRAST:  80mL ISOVUE-300 IOPAMIDOL (ISOVUE-300) INJECTION 61% COMPARISON:  Current acute abdominal series. FINDINGS: Lower chest: No acute abnormality. Hepatobiliary: No focal liver abnormality is seen. No gallstones, gallbladder wall thickening, or biliary dilatation. Pancreas: Unremarkable. No pancreatic ductal dilatation or surrounding inflammatory changes. Spleen: Normal in size without focal abnormality. Adrenals/Urinary Tract: No adrenal masses. Bilateral renal cortical  thinning. Several small low-density right renal masses consistent with cysts. No other renal lesions. No stones. No hydronephrosis. Ureters are normal in course and in caliber. Bladder is unremarkable. Stomach/Bowel: Stomach is unremarkable. There is a diverticulum from the medial aspect of the duodenum at the junction of the second and third portions. Small bowel is normal in caliber. No wall thickening or adjacent inflammation. Rectum is distended with stool. No rectal wall thickening or adjacent inflammation. There are multiple colonic diverticula. No diverticulitis or other colon inflammatory process. Normal appendix not discretely seen. No evidence of appendicitis. Vascular/Lymphatic: Aortic atherosclerosis. No pathologically enlarged lymph nodes. Reproductive: Uterus and bilateral adnexa are unremarkable. Other: No abdominal wall hernia.  No ascites. Musculoskeletal: Moderate fracture of L3. Moderate fracture of T11. T11 fracture is stable from a chest CT dated 09/17/2017. L3 fracture is new since a lumbar MRI dated 10/20/2015. No other fractures. No osteoblastic or osteolytic lesions. Left hip arthroplasty appears well seated and aligned. IMPRESSION: 1. Moderate fracture of L3 new since of lumbar spine MRI dated 10/20/2015, but of unclear chronicity. There is no associated edema or hemorrhage, which suggests that this is chronic. Old compression fracture of T11. 2. No acute findings within the abdomen or pelvis. 3. Increased stool causes moderate distention of the rectum. 4. Multiple colonic diverticula without evidence of diverticulitis. 5. Aortic atherosclerosis. Electronically Signed   By: Amie Portland M.D.   On: 11/03/2017 14:09   Dg Abd Acute W/chest  Result Date: 11/03/2017 CLINICAL DATA:  patient reports she awoke approx 2am with generalized abdominal pain. Fever in the ED this AM. Denies nausea, SOB, cough, fever, or constipation. Hx of HTN. Non-smoker EXAM: DG ABDOMEN ACUTE W/ 1V CHEST  COMPARISON:  10/04/2017 FINDINGS: There is no bowel dilation to suggest obstruction or significant generalized adynamic ileus. No free air or significant air-fluid levels on the erect view. Soft tissues are not well-defined. There is no convincing renal or ureteral stone. There are scattered aortic, iliac and branch vessel vascular calcifications. Soft tissues are otherwise unremarkable. Cardiac silhouette is normal size. No mediastinal or hilar masses. Clear lungs. Elevation of right hemidiaphragm is similar to the prior exam. Skeletal structures are demineralized. There has been a previous total left hip arthroplasty. IMPRESSION: 1. No acute findings in the abdomen or pelvis. No evidence of bowel obstruction or free air. 2. No  acute cardiopulmonary disease. Electronically Signed   By: Amie Portlandavid  Ormond M.D.   On: 11/03/2017 11:01    Procedures Procedures (including critical care time)  Medications Ordered in ED Medications  cefTRIAXone (ROCEPHIN) 1 g in sodium chloride 0.9 % 100 mL IVPB (not administered)  0.9 %  sodium chloride infusion (not administered)  cefTRIAXone (ROCEPHIN) 1 g in sodium chloride 0.9 % 100 mL IVPB (not administered)  acetaminophen (TYLENOL) tablet 1,000 mg (1,000 mg Oral Given 11/03/17 1140)  sodium chloride 0.9 % bolus 500 mL (500 mLs Intravenous New Bag/Given 11/03/17 1203)  iopamidol (ISOVUE-300) 61 % injection (80 mLs Intravenous Contrast Given 11/03/17 1324)     Initial Impression / Assessment and Plan / ED Course  I have reviewed the triage vital signs and the nursing notes.  Pertinent labs & imaging results that were available during my care of the patient were reviewed by me and considered in my medical decision making (see chart for details).     Patient is a 84 year old female who presents with fever up to 101 and altered mental status.  She has evidence of urinary tract infection but otherwise I do not find any infectious etiology.  Her influenza still pending.  She  was complaining of some abdominal pain and a CT scan was performed which was negative for acute abnormality.  Her chest x-ray does not show any evidence of pneumonia.  Her lactate is normal with no other suggestions of sepsis.  She was given antibiotics for urinary tract infection.  Her hemoglobin is slightly lower than her prior values.  Her Hemoccult was performed which was negative for occult blood.  She was given IV fluids.  She still less oriented and less active than normal.  She is sleeping most of the time she is been here in the emergency department.  I will consult hospitalist for admission.  I spoke with Revonda StandardAllison who will admit the pt.  Final Clinical Impressions(s) / ED Diagnoses   Final diagnoses:  Disorientation  Acute cystitis without hematuria  Fever, unspecified fever cause    ED Discharge Orders    None       Rolan BuccoBelfi, Daenerys Buttram, MD 11/03/17 1447

## 2017-11-03 NOTE — ED Triage Notes (Signed)
Per EMS patient awoke approx 2am with chest pain and then at breakfast staff noticed she was not feeling good. Initial B/P was 250/110 and reverified at 225/108. 3 nitro given and 324 ASA with pain being reduced to "better".

## 2017-11-03 NOTE — Progress Notes (Signed)
Pt admitted to 5 west room 17. Tele placed. VS taken. Assessment charted.   Family at bedside.   All questions/ concerns addressed.   Will continue to monitor.

## 2017-11-03 NOTE — ED Notes (Signed)
Pt using a Purewick 

## 2017-11-03 NOTE — H&P (Addendum)
History and Physical    Katherine ShihRachel Maund ZOX:[id number] DOB: 1921/11/04 DOA: 11/03/2017  **Will admit patient based on the expectation that the patient will need hospitalization/ hospital care that crosses at least 2 midnights  PCP: Lewis Moccasinewey, Elizabeth R, MD   Attending physician: Melynda RippleHobbs  Patient coming from/Resides with: Assisted living facility  Chief Complaint: Altered mentation  HPI: Katherine Kirby is a 84 y.o. female with medical history significant for mild dementia, hypertension, osteoporosis, history of thoracic and lumbar compression fractures as well as history of pelvic fracture, normocytic anemia, and stage III chronic kidney disease.  Patient was recently discharged on 121/19 after an admission for acute respiratory failure in the context of rhinovirus, syncopal episode and likely UTI.  According to family at bedside patient has not done well since this recent discharge.  She has been treated for sinus infection, another UTI, and was treated for influenza as well.  Today the patient got up and walked to the dining room and was complaining of some sort of discomfort possibly in the chest.  Patient's blood pressure was markedly elevated at that time 250/110 and repeat blood pressure was 225/108.  Staff were reluctant to give patient her normal antihypertensive medications but she was given a full dose aspirin and 3 nitroglycerin with apparent resolution in pain.  Because of the symptoms she was transported to the ER.  Family reports that patient has had subtle alteration in mentation beginning this past Thursday and has worsened as of today.  In the ER patient had a low-grade temperature of 100.1 F orally.  Urinalysis was abnormal and concerning for UTI so empiric dose of Rocephin was given.  Lactic acid normal.  No leukocytosis.  Hemoglobin slightly lower than normal at 8.9 and occult blood negative.  CT the abdomen and pelvis revealed rectum distended with stool, moderate fracture of L3  new since MRI from 2017 but without associated edema or hemorrhage suggesting that this is chronic in nature.  Patient will be admitted with metabolic encephalopathy in the context of recurrent UTI and underlying dementia.  ED Course:  Vital Signs: BP (!) 152/75   Pulse 87   Temp (!) 101 F (38.3 C) (Rectal)   Resp 13   SpO2 95%  DG abd/chest: Neg CT abd/pelvis: As above Lab data: Sodium 133, potassium 3.6, chloride 101, CO2 20, glucose 116, BUN 16, creatinine 0.98, albumin 3.0, lactic acid normal x2 collections, white count 7400 with neutrophils 83% and absolute neutrophils 6.1%, hemoglobin 8.9, MCV normal, platelets 199,000.  Urinalysis abnormal with small amount of hemoglobin, trace leukocytes, many bacteria, 6-30 WBCs, blood cultures obtained in the ER, urine culture pending collection.  FOB negative Medications and treatments: 101 g x1, normal saline bolus 500 cc x1, Rocephin 1 g dose ordered but pending administration  Review of Systems:  In addition to the HPI above,  No Headache, changes with Vision or hearing, new weakness, tingling, numbness in any extremity, dizziness, dysarthria or word finding difficulty, gait disturbance or imbalance, tremors or seizure activity No problems swallowing food or Liquids, indigestion/reflux, choking or coughing while eating, abdominal pain with or after eating No Cough or Shortness of Breath, palpitations, orthopnea or DOE No Abdominal pain, N/V, melena,hematochezia, dark tarry stools No dysuria, malodorous urine, hematuria or flank pain No new skin rashes, lesions, masses or bruises, No new joint pains, aches, swelling or redness No recent unintentional weight gain or loss No polyuria, polydypsia or polyphagia   Past Medical History:  Diagnosis Date  .  Anemia   . CKD (chronic kidney disease) stage 3, GFR 30-59 ml/min (HCC)   . Dementia   . Hypertension   . Lumbar compression fracture (HCC)   . Pelvis fracture (HCC)   . Thoracic  compression fracture [facility])     Past Surgical History:  Procedure Laterality Date  . TOTAL HIP ARTHROPLASTY Left 2007    Social History   Socioeconomic History  . Marital status: Widowed    Spouse name: Not on file  . Number of children: Not on file  . Years of education: Not on file  . Highest education level: Not on file  Social Needs  . Financial resource strain: Not on file  . Food insecurity - worry: Not on file  . Food insecurity - inability: Not on file  . Transportation needs - medical: Not on file  . Transportation needs - non-medical: Not on file  Occupational History  . Not on file  Tobacco Use  . Smoking status: Never Smoker  . Smokeless tobacco: Never Used  Substance and Sexual Activity  . Alcohol use: Yes    Comment: ocasional  . Drug use: No  . Sexual activity: Not on file  Other Topics Concern  . Not on file  Social History Narrative  . Not on file    Mobility: Rolling walker Work history: Not obtained   No Known Allergies  Family history reviewed and not pertinent to current admission findings and diagnosis  Prior to Admission medications   Medication Sig Start Date End Date Taking? Authorizing Provider  albuterol (PROVENTIL) (2.5 MG/3ML) 0.083% nebulizer solution Take 2.5 mg by nebulization 3 (three) times daily.   Yes [provider]  aspirin 81 MG chewable tablet Chew 81 mg by mouth daily.   Yes [provider]  bisacodyl (DULCOLAX) 5 MG EC tablet Take 2 tablets (10 mg total) by mouth daily as needed for moderate constipation. 09/24/17  Yes Regalado, Belkys A, MD  calcitonin, salmon, (MIACALCIN/FORTICAL) 200 UNIT/ACT nasal spray Place 1 spray into alternate nostrils See admin instructions. Spray 1 spray into Right nostril every other day then alternating with left nostril   Yes [provider]  Cyanocobalamin (VITAMIN B 12) 100 MCG LOZG Take 100 mcg by mouth daily.    Yes [provider]  hydrALAZINE  (APRESOLINE) 25 MG tablet Take 1 tablet (25 mg total) by mouth every 8 (eight) hours. Patient taking differently: Take 25 mg by mouth 2 (two) times daily.  09/24/17  Yes Regalado, Belkys A, MD  ipratropium-albuterol (DUONEB) 0.5-2.5 (3) MG/3ML SOLN Take 3 mLs by nebulization 3 (three) times daily. 09/24/17  Yes Regalado, Belkys A, MD  metoprolol succinate (TOPROL-XL) 50 MG 24 hr tablet Take 1 tablet (50 mg total) by mouth daily. Take with or immediately following a meal. 09/25/17  Yes Regalado, Belkys A, MD  polyethylene glycol (MIRALAX / GLYCOLAX) packet Take 17 g by mouth daily as needed. 09/24/17  Yes Regalado, Belkys A, MD  senna-docusate (SENOKOT-S) 8.6-50 MG tablet Take 1 tablet by mouth at bedtime as needed for mild constipation. 09/24/17  Yes Regalado, Belkys A, MD  guaiFENesin (MUCINEX) 600 MG 12 hr tablet Take 1 tablet (600 mg total) by mouth 2 (two) times daily. Patient not taking: Reported on 11/03/2017 09/24/17   Regalado, Jon Billings A, MD  sodium bicarbonate 650 MG tablet Take 1 tablet (650 mg total) by mouth 2 (two) times daily. Patient not taking: Reported on 11/03/2017 09/24/17   Alba Cory, MD  Physical Exam: Vitals:   11/03/17 1145 11/03/17 1200 11/03/17 1215 11/03/17 1230  BP: (!) 184/86 (!) 163/94 (!) 150/80 (!) 152/75  Pulse: 93 91 92 87  Resp: 20 (!) 22 15 13   Temp:      TempSrc:      SpO2: 94% 97% 96% 95%      Constitutional: NAD, calm, comfortable Eyes: PERRL, lids and conjunctivae normal ENMT: Mucous membranes are moist. Posterior pharynx clear of any exudate or lesions.Normal dentition.  Neck: normal, supple, no masses, no thyromegaly Respiratory: clear to auscultation bilaterally, no wheezing, no crackles. Normal respiratory effort. No accessory muscle use.  Cardiovascular: Regular rate and rhythm, no murmurs / rubs / gallops. No extremity edema. 2+ pedal pulses. No carotid bruits.  Abdomen: Subtle suprapubic tenderness no guarding or rebound, no masses palpated.  No hepatosplenomegaly. Bowel sounds positive.  Musculoskeletal: no clubbing / cyanosis. No joint deformity upper and lower extremities. Good ROM, no contractures. Normal muscle tone.  Skin: no rashes, lesions, ulcers. No induration Neurologic: CN 2-12 grossly intact. Sensation intact, DTR normal. Strength 5/5 x all 4 extremities.  Psychiatric:  Alert and oriented x name only. Normal mood.    Labs on Admission: I have personally reviewed following labs and imaging studies  CBC: Recent Labs  Lab 11/03/17 1011  WBC 7.4  NEUTROABS 6.1  HGB 8.9*  HCT 26.9*  MCV 90.6  PLT 199   Basic Metabolic Panel: Recent Labs  Lab 11/03/17 1011  NA 133*  K 3.6  CL 101  CO2 20*  GLUCOSE 116*  BUN 16  CREATININE 0.98  CALCIUM 8.7*   GFR: CrCl cannot be calculated (Unknown ideal weight.). Liver Function Tests: Recent Labs  Lab 11/03/17 1011  AST 22  ALT 10*  ALKPHOS 58  BILITOT 0.9  PROT 5.6*  ALBUMIN 3.0*   No results for input(s): LIPASE, AMYLASE in the last 168 hours. No results for input(s): AMMONIA in the last 168 hours. Coagulation Profile: No results for input(s): INR, PROTIME in the last 168 hours. Cardiac Enzymes: No results for input(s): CKTOTAL, CKMB, CKMBINDEX, TROPONINI in the last 168 hours. BNP (last 3 results) No results for input(s): PROBNP in the last 8760 hours. HbA1C: No results for input(s): HGBA1C in the last 72 hours. CBG: No results for input(s): GLUCAP in the last 168 hours. Lipid Profile: No results for input(s): CHOL, HDL, LDLCALC, TRIG, CHOLHDL, LDLDIRECT in the last 72 hours. Thyroid Function Tests: No results for input(s): TSH, T4TOTAL, FREET4, T3FREE, THYROIDAB in the last 72 hours. Anemia Panel: No results for input(s): VITAMINB12, FOLATE, FERRITIN, TIBC, IRON, RETICCTPCT in the last 72 hours. Urine analysis:    Component Value Date/Time   COLORURINE YELLOW 11/03/2017 1117   APPEARANCEUR CLEAR 11/03/2017 1117   LABSPEC 1.011 11/03/2017  1117   PHURINE 5.0 11/03/2017 1117   GLUCOSEU NEGATIVE 11/03/2017 1117   HGBUR SMALL (A) 11/03/2017 1117   BILIRUBINUR NEGATIVE 11/03/2017 1117   KETONESUR NEGATIVE 11/03/2017 1117   PROTEINUR NEGATIVE 11/03/2017 1117   NITRITE NEGATIVE 11/03/2017 1117   LEUKOCYTESUR TRACE (A) 11/03/2017 1117   Sepsis Labs: @LABRCNTIP (procalcitonin:4,lacticidven:4) )No results found for this or any previous visit (from the past 240 hour(s)).   Radiological Exams on Admission: Ct Abdomen Pelvis W Contrast  Result Date: 11/03/2017 CLINICAL DATA:  Patient awoke approx 2am with chest pain and then at breakfast staff noticed she was not feeling good. Initial B/P was 250/110 and reverified at 225/108. Complains now of epigastric pain. EXAM: CT ABDOMEN AND  PELVIS WITH CONTRAST TECHNIQUE: Multidetector CT imaging of the abdomen and pelvis was performed using the standard protocol following bolus administration of intravenous contrast. CONTRAST:  80mL ISOVUE-300 IOPAMIDOL (ISOVUE-300) INJECTION 61% COMPARISON:  Current acute abdominal series. FINDINGS: Lower chest: No acute abnormality. Hepatobiliary: No focal liver abnormality is seen. No gallstones, gallbladder wall thickening, or biliary dilatation. Pancreas: Unremarkable. No pancreatic ductal dilatation or surrounding inflammatory changes. Spleen: Normal in size without focal abnormality. Adrenals/Urinary Tract: No adrenal masses. Bilateral renal cortical thinning. Several small low-density right renal masses consistent with cysts. No other renal lesions. No stones. No hydronephrosis. Ureters are normal in course and in caliber. Bladder is unremarkable. Stomach/Bowel: Stomach is unremarkable. There is a diverticulum from the medial aspect of the duodenum at the junction of the second and third portions. Small bowel is normal in caliber. No wall thickening or adjacent inflammation. Rectum is distended with stool. No rectal wall thickening or adjacent inflammation. There  are multiple colonic diverticula. No diverticulitis or other colon inflammatory process. Normal appendix not discretely seen. No evidence of appendicitis. Vascular/Lymphatic: Aortic atherosclerosis. No pathologically enlarged lymph nodes. Reproductive: Uterus and bilateral adnexa are unremarkable. Other: No abdominal wall hernia.  No ascites. Musculoskeletal: Moderate fracture of L3. Moderate fracture of T11. T11 fracture is stable from a chest CT dated 09/17/2017. L3 fracture is new since a lumbar MRI dated 10/20/2015. No other fractures. No osteoblastic or osteolytic lesions. Left hip arthroplasty appears well seated and aligned. IMPRESSION: 1. Moderate fracture of L3 new since of lumbar spine MRI dated 10/20/2015, but of unclear chronicity. There is no associated edema or hemorrhage, which suggests that this is chronic. Old compression fracture of T11. 2. No acute findings within the abdomen or pelvis. 3. Increased stool causes moderate distention of the rectum. 4. Multiple colonic diverticula without evidence of diverticulitis. 5. Aortic atherosclerosis. Electronically Signed   By: Amie Portland M.D.   On: 11/03/2017 14:09   Dg Abd Acute W/chest  Result Date: 11/03/2017 CLINICAL DATA:  patient reports she awoke approx 2am with generalized abdominal pain. Fever in the ED this AM. Denies nausea, SOB, cough, fever, or constipation. Hx of HTN. Non-smoker EXAM: DG ABDOMEN ACUTE W/ 1V CHEST COMPARISON:  10/04/2017 FINDINGS: There is no bowel dilation to suggest obstruction or significant generalized adynamic ileus. No free air or significant air-fluid levels on the erect view. Soft tissues are not well-defined. There is no convincing renal or ureteral stone. There are scattered aortic, iliac and branch vessel vascular calcifications. Soft tissues are otherwise unremarkable. Cardiac silhouette is normal size. No mediastinal or hilar masses. Clear lungs. Elevation of right hemidiaphragm is similar to the prior exam.  Skeletal structures are demineralized. There has been a previous total left hip arthroplasty. IMPRESSION: 1. No acute findings in the abdomen or pelvis. No evidence of bowel obstruction or free air. 2. No acute cardiopulmonary disease. Electronically Signed   By: Amie Portland M.D.   On: 11/03/2017 11:01    EKG: (Independently reviewed) sinus rhythm with ventricular rate 98 bpm, QTC 468 ms, normal R wave rotation, no acute ischemic changes  Assessment/Plan Principal Problem:   Acute metabolic encephalopathy/  Mild dementia -Since with acute altered mentation in the context of known dementia and recurrent UTI -Symptoms improving with administration of IV fluids -Treat underlying causes -Neurological checks every 4 hours  Active Problems:   UTI (urinary tract infection) -Patient previously treated for pansensitive E. coli UTI in 2017 -During previous admissions despite orders for cultures these were never  obtained -Patient has also been treated at the facility for presumed UTI in the past 30 days -Continue empiric Rocephin -Urine culture ordered by EDP and pending collection; follow-up on blood cultures -CT abdomen/pelvis does not offer explanation for recurrent UTIs noting no hydronephrosis or evidence of pyelonephritis on scan -Chronic poor PO intake (esp fluids)- gentle IVF hydration     Constipation -Patient presents with rectal constipation -On a variety of laxative and constipation preventative meds at facility but these are primarily prn -Dulcolax suppository now and can consider fleets enema if no response -Continue MiraLAX daily -Consider changing Senokot at bedtime from prn scheduled -Consider changing preadmission Dulcolax to scheduled    Anemia -Recent hemoglobin around 10.6 -Current hemoglobin 8.9 -FOB negative -Follow CBC    High blood pressure -Was markedly elevated according to nursing facility documentation -Was given sublingual nitroglycerin secondary to  associated chest pain in patient no longer experiencing chest pain -Suspect chest pain secondary to poorly controlled blood pressure -EKG unremarkable    CKD (chronic kidney disease) stage 3, GFR 30-59 ml/min  -Renal function stable and at baseline    Thoracic and lumbar compression fractures -No reports of back pain    **Additional lab, imaging and/or diagnostic evaluation at discretion of supervising physician  DVT prophylaxis: Lovenox Code Status: DNR Family Communication: Family at bedside Disposition Plan: Assisted living facility Consults called: None    ELLIS,ALLISON L. ANP-BC [facility] Pager [phone]   If 7PM-7AM, please contact night-coverage [email or web address] Password [email or web address]  11/03/2017, 3:10 PM

## 2017-11-03 NOTE — ED Notes (Signed)
Patient eating dinner at this time. Unable to get temperature.

## 2017-11-04 ENCOUNTER — Encounter (HOSPITAL_COMMUNITY): Payer: Self-pay

## 2017-11-04 DIAGNOSIS — G9341 Metabolic encephalopathy: Secondary | ICD-10-CM

## 2017-11-04 DIAGNOSIS — N3000 Acute cystitis without hematuria: Secondary | ICD-10-CM

## 2017-11-04 LAB — BASIC METABOLIC PANEL
ANION GAP: 10 (ref 5–15)
BUN: 14 mg/dL (ref 6–20)
CHLORIDE: 106 mmol/L (ref 101–111)
CO2: 19 mmol/L — ABNORMAL LOW (ref 22–32)
Calcium: 8.2 mg/dL — ABNORMAL LOW (ref 8.9–10.3)
Creatinine, Ser: 0.94 mg/dL (ref 0.44–1.00)
GFR calc Af Amer: 59 mL/min — ABNORMAL LOW (ref >60)
GFR calc non Af Amer: 51 mL/min — ABNORMAL LOW (ref >60)
GLUCOSE: 92 mg/dL (ref 65–99)
Potassium: 2.9 mmol/L — ABNORMAL LOW (ref 3.5–5.1)
SODIUM: 135 mmol/L (ref 135–145)

## 2017-11-04 LAB — MRSA PCR SCREENING: MRSA BY PCR: NEGATIVE

## 2017-11-04 LAB — CBC
HCT: 25.7 % — ABNORMAL LOW (ref 36.0–46.0)
HEMOGLOBIN: 8.4 g/dL — AB (ref 12.0–15.0)
MCH: 29.8 pg (ref 26.0–34.0)
MCHC: 32.7 g/dL (ref 30.0–36.0)
MCV: 91.1 fL (ref 78.0–100.0)
Platelets: 195 10*3/uL (ref 150–400)
RBC: 2.82 MIL/uL — ABNORMAL LOW (ref 3.87–5.11)
RDW: 14.7 % (ref 11.5–15.5)
WBC: 4.2 10*3/uL (ref 4.0–10.5)

## 2017-11-04 MED ORDER — POTASSIUM CHLORIDE CRYS ER 20 MEQ PO TBCR
40.0000 meq | EXTENDED_RELEASE_TABLET | ORAL | Status: AC
  Administered 2017-11-04 (×2): 40 meq via ORAL
  Filled 2017-11-04 (×2): qty 2

## 2017-11-04 NOTE — Evaluation (Signed)
Occupational Therapy Evaluation Patient Details Name: Katherine Kirby MRN: [id number] DOB: Jun 21, 1922 Today's Date: 11/04/2017    History of Present Illness 84 y.o. female with medical history significant for mild dementia, hypertension, osteoporosis, history of thoracic and lumbar compression fractures as well as history of pelvic fracture, normocytic anemia, and stage III chronic kidney disease. Pt admitted for acute metabolic encephalopathy superimposed on dementia, likely due to UTI.    Clinical Impression   Pt admitted with the above diagnoses and presents with below problem list. Pt will benefit from continued acute OT to address the below listed deficits and maximize independence with basic ADLs prior to d/c back to ALF. PTA pt was mod I with functional mobility/transfers, assist from ALF staff with bathing/dressing. Pt currently min guard for functional mobility, mostly min guard to min A with functional transfers, mod A with bathing/dressing. Pt reports pt's cognition has improved from yesterday but not fully back to baseline. Family reporting several medical issues recently which they feel have contributed to on overall decline in cognition. "We're wondering if this(cognition) is her new baseline.) Will follow acutely.      Follow Up Recommendations  No OT follow up;Supervision/Assistance - 24 hour    Equipment Recommendations  None recommended by OT    Recommendations for Other Services       Precautions / Restrictions Precautions Precautions: Fall Precaution Comments: watch sats Restrictions Weight Bearing Restrictions: No      Mobility Bed Mobility Overal bed mobility: Needs Assistance Bed Mobility: Supine to Sit;Sit to Supine     Supine to sit: HOB elevated;Min guard;Min assist Sit to supine: Min guard   General bed mobility comments: +rail, increased time and effort. multimodal cues for task initiation  Transfers Overall transfer level: Needs  assistance Equipment used: Rolling walker (2 wheeled) Transfers: Sit to/from Stand Sit to Stand: Min guard;Min assist         General transfer comment: verbal cues for hand placement, min A to steady on initial stand    Balance Overall balance assessment: Needs assistance Sitting-balance support: No upper extremity supported;Feet supported Sitting balance-Leahy Scale: Good     Standing balance support: Bilateral upper extremity supported;During functional activity Standing balance-Leahy Scale: Poor Standing balance comment: heavy reliance on RW                           ADL either performed or assessed with clinical judgement   ADL Overall ADL's : Needs assistance/impaired Eating/Feeding: Set up;Sitting   Grooming: Standing;Moderate assistance;Min guard;Oral care;Brushing hair Grooming Details (indicate cue type and reason): cueing and increased time for task initiation. assist for task completion with combing hair (cognition) Upper Body Bathing: Moderate assistance;Sitting   Lower Body Bathing: Moderate assistance;Sit to/from stand   Upper Body Dressing : Moderate assistance;Sitting   Lower Body Dressing: Moderate assistance;Sit to/from stand   Toilet Transfer: Ambulation;Minimal assistance;BSC;RW   Toileting- Clothing Manipulation and Hygiene: Minimal assistance;Sitting/lateral lean;Sit to/from stand   Tub/ Engineer, structural: Walk-in shower;Minimal assistance;3 in Actuary   Functional mobility during ADLs: Min guard;Rolling walker General ADL Comments: Pt completed in-room functional mobility and 2 grooming tasks standing at sink.     Vision         Perception     Praxis      Pertinent Vitals/Pain Pain Assessment: Faces Faces Pain Scale: No hurt     Hand Dominance     Extremity/Trunk Assessment Upper Extremity Assessment Upper Extremity Assessment: Generalized weakness  Lower Extremity Assessment Lower Extremity Assessment: Defer  to PT evaluation   Cervical / Trunk Assessment Cervical / Trunk Assessment: Kyphotic   Communication Communication Communication: No difficulties   Cognition Arousal/Alertness: Awake/alert Behavior During Therapy: WFL for tasks assessed/performed Overall Cognitive Status: Impaired/Different from baseline Area of Impairment: Orientation;Safety/judgement;Memory;Problem solving;Following commands                 Orientation Level: Disoriented to;Situation;Time;Place   Memory: Decreased short-term memory Following Commands: Follows one step commands with increased time;Follows multi-step commands inconsistently Safety/Judgement: Decreased awareness of safety   Problem Solving: Slow processing;Difficulty sequencing General Comments: Family present to confirm dementia/confusion at baseline but confusion gradually worsening.  Multimodal cues to initiate/change task.   General Comments       Exercises     Shoulder Instructions      Home Living Family/patient expects to be discharged to:: Assisted living                             Home Equipment: Walker - 4 wheels          Prior Functioning/Environment Level of Independence: Needs assistance  Gait / Transfers Assistance Needed: ambulates supervision with rollator. Supervision needed due to confusion. ADL's / Homemaking Assistance Needed: ALF staff assists with ADLs            OT Problem List: Impaired balance (sitting and/or standing);Decreased safety awareness;Decreased cognition;Decreased knowledge of use of DME or AE;Decreased knowledge of precautions;Cardiopulmonary status limiting activity      OT Treatment/Interventions: Self-care/ADL training;DME and/or AE instruction;Therapeutic activities;Patient/family education;Balance training    OT Goals(Current goals can be found in the care plan section) Acute Rehab OT Goals Patient Stated Goal: feel better OT Goal Formulation: With patient/family Time  For Goal Achievement: 11/18/17 Potential to Achieve Goals: Good ADL Goals Pt Will Perform Grooming: with min assist;standing;with mod assist Pt Will Transfer to Toilet: with supervision;ambulating Pt Will Perform Toileting - Clothing Manipulation and hygiene: with supervision;sit to/from stand Pt Will Perform Tub/Shower Transfer: with min guard assist;Shower transfer;ambulating;3 in 1;rolling walker  OT Frequency: Min 2X/week   Barriers to D/C:            Co-evaluation              AM-PAC PT "6 Clicks" Daily Activity     Outcome Measure Help from another person eating meals?: A Little Help from another person taking care of personal grooming?: A Little Help from another person toileting, which includes using toliet, bedpan, or urinal?: A Little Help from another person bathing (including washing, rinsing, drying)?: A Lot Help from another person to put on and taking off regular upper body clothing?: A Lot Help from another person to put on and taking off regular lower body clothing?: A Lot 6 Click Score: 15   End of Session Equipment Utilized During Treatment: Rolling walker  Activity Tolerance: Patient tolerated treatment well Patient left: in bed;with call bell/phone within reach;with bed alarm set;with family/visitor present  OT Visit Diagnosis: Unsteadiness on feet (R26.81);Muscle weakness (generalized) (M62.81);Other symptoms and signs involving cognitive function                Time: [id number] OT Time Calculation (min): 22 min Charges:  OT General Charges $OT Visit: 1 Visit OT Evaluation $OT Eval Low Complexity: 1 Low G-Codes:       Pilar GrammesMathews, Leiya Keesey H 11/04/2017, 4:38 PM

## 2017-11-04 NOTE — Progress Notes (Signed)
[facility]                                                                              Patient Demographics  Katherine Kirby, is a 84 y.o. female, DOB - 09/20/1921, WUJ:[id number]  Admit date - 11/03/2017   Admitting Physician Haydee Salter, MD  Outpatient Primary MD for the patient is Lewis Moccasin, MD  Outpatient specialists:   LOS - 1  days   Medical records reviewed and are as summarized below:    Chief Complaint  Patient presents with  . Chest Pain       Brief summary   Patient is a 84 year old female with dementia, hypertension. Process, history of thoracic and lumbar compression fracture, normocytic anemia, stage III CKD who was recently discharged on 09/24/17 after admission for acute respiratory failure in the context of rhinovirus, syncopal episode and likely UTI. Per family she has not done well since the recent discharge, has been treated for sinus infection, another UTI and influenza. Patient presented with altered mental status, BP was elevated in 200s at the facility. Family had noted subtle alteration in mentation beginning last week, 34 days ago. In the ED, patient had low-grade temp of 100.30F, UA concerning for UTI. CT of the abdomen and pelvis revealed rectum distended with stool moderate fracture of L3 new since MRI from 2017.   Assessment & Plan    Principal Problem:   Acute metabolic encephalopathy superimposed on dementia, likely due to UTI - Improving, continue gentle hydration, IV antibiotics  Active Problems:   UTI (urinary tract infection) - Follow urine culture and sensitivities, continue IV Rocephin, will adjust antibiotics according to the sensitivities    Constipation - CT abdomen showed rectum distended with stool, placed on Dulcolax supp, fleets enema if no improvement. - Continue MiraLAX, Senokot    Anemia - Normocytic, likely due to anemia of chronic disease, hemoglobin at baseline 9-10 - Hemoglobin 8.4, monitor  closely     CKD (chronic kidney disease) stage 3, GFR 30-59 ml/min (HCC) - Currently stable, no acute issues    Thoracic compression fracture (HCC), lumbar compression fractures - Currently no back pain   Essential Hypertension - BP currently stable   Code Status: DO NOT RESUSCITATE  DVT Prophylaxis:  Lovenox  Family Communication: Discussed in detail with the patient, all imaging results, lab results explained to the patient and daughter-in-law at the bedside   Disposition Plan: Possibly back to ALF in 24-48 hours pending urine culture results  Time Spent in minutes  35 minutes  Procedures:    Consultants:   NONE   Antimicrobials:  IV Rocephin    Medications  Scheduled Meds: . aspirin  81 mg Oral Daily  . calcitonin (salmon)  1 spray Alternating Nares Daily  . enoxaparin (LOVENOX) injection  30 mg Subcutaneous Q24H  . hydrALAZINE  25 mg Oral BID  . metoprolol tartrate  5 mg Intravenous Q6H  . sodium chloride flush  3 mL Intravenous Q12H   Continuous Infusions: . sodium chloride 75 mL/hr at 11/04/17 0504  . cefTRIAXone (ROCEPHIN)  IV     PRN Meds:.acetaminophen **OR** acetaminophen, hydrALAZINE,  ipratropium-albuterol, ondansetron **OR** ondansetron (ZOFRAN) IV, polyethylene glycol, senna-docusate, sodium phosphate   Antibiotics   Anti-infectives (From admission, onward)   Start     Dose/Rate Route Frequency Ordered Stop   11/04/17 1400  cefTRIAXone (ROCEPHIN) 1 g in sodium chloride 0.9 % 100 mL IVPB     1 g 200 mL/hr over 30 Minutes Intravenous Every 24 hours 11/03/17 1447     11/03/17 1430  cefTRIAXone (ROCEPHIN) 1 g in sodium chloride 0.9 % 100 mL IVPB     1 g 200 mL/hr over 30 Minutes Intravenous  Once 11/03/17 1416 11/03/17 1615        Subjective:   Katherine Kirby was seen and examined today.  Denies any specific complaints, has dementia, overall slowly improving, low-grade temp of 99.21F this morning. Patient denies dizziness, chest pain,  shortness of breath, abdominal pain, N/V/D/C, new weakness, numbess, tingling.  Objective:   Vitals:   11/03/17 1800 11/03/17 2137 11/04/17 0452 11/04/17 0500  BP: (!) 121/56 138/62 130/70   Pulse: 94 97 87   Resp:  18 18   Temp: 99.1 F (37.3 C) 98.6 F (37 C) 98.6 F (37 C)   TempSrc: Oral Oral Oral   SpO2: 96% 95% 95%   Weight:    54.4 kg (120 lb)    Intake/Output Summary (Last 24 hours) at 11/04/2017 1015 Last data filed at 11/04/2017 0608 Gross per 24 hour  Intake 991.25 ml  Output 950 ml  Net 41.25 ml     Wt Readings from Last 3 Encounters:  11/04/17 54.4 kg (120 lb)  09/17/17 53.4 kg (117 lb 11.6 oz)  07/22/16 49.9 kg (110 lb)     Exam  General: Alert and oriented x 2, self and person,   NAD  Eyes:   HEENT:  Atraumatic, normocephalic  Cardiovascular: S1 S2 auscultated,  Regular rate and rhythm.  Respiratory: Clear to auscultation bilaterally, no wheezing, rales or rhonchi  Gastrointestinal: Soft, nontender, nondistended, + bowel sounds  Ext: no pedal edema bilaterally  Neuro:no new deficits   Musculoskeletal: No digital cyanosis, clubbing  Skin: No rashes  Psych:    Data Reviewed:  I have personally reviewed following labs and imaging studies  Micro Results Recent Results (from the past 240 hour(s))  MRSA PCR Screening     Status: None   Collection Time: 11/03/17 10:48 PM  Result Value Ref Range Status   MRSA by PCR NEGATIVE NEGATIVE Final    Comment:        The GeneXpert MRSA Assay (FDA approved for NASAL specimens only), is one component of a comprehensive MRSA colonization surveillance program. It is not intended to diagnose MRSA infection nor to guide or monitor treatment for MRSA infections. Performed at [facility], [address] [place], [place] [address]     Radiology Reports Ct Abdomen Pelvis W Contrast  Result Date: 11/03/2017 CLINICAL DATA:  Patient awoke approx 2am with chest pain and then at breakfast staff  noticed she was not feeling good. Initial B/P was 250/110 and reverified at 225/108. Complains now of epigastric pain. EXAM: CT ABDOMEN AND PELVIS WITH CONTRAST TECHNIQUE: Multidetector CT imaging of the abdomen and pelvis was performed using the standard protocol following bolus administration of intravenous contrast. CONTRAST:  80mL ISOVUE-300 IOPAMIDOL (ISOVUE-300) INJECTION 61% COMPARISON:  Current acute abdominal series. FINDINGS: Lower chest: No acute abnormality. Hepatobiliary: No focal liver abnormality is seen. No gallstones, gallbladder wall thickening, or biliary dilatation. Pancreas: Unremarkable. No pancreatic ductal dilatation or  surrounding inflammatory changes. Spleen: Normal in size without focal abnormality. Adrenals/Urinary Tract: No adrenal masses. Bilateral renal cortical thinning. Several small low-density right renal masses consistent with cysts. No other renal lesions. No stones. No hydronephrosis. Ureters are normal in course and in caliber. Bladder is unremarkable. Stomach/Bowel: Stomach is unremarkable. There is a diverticulum from the medial aspect of the duodenum at the junction of the second and third portions. Small bowel is normal in caliber. No wall thickening or adjacent inflammation. Rectum is distended with stool. No rectal wall thickening or adjacent inflammation. There are multiple colonic diverticula. No diverticulitis or other colon inflammatory process. Normal appendix not discretely seen. No evidence of appendicitis. Vascular/Lymphatic: Aortic atherosclerosis. No pathologically enlarged lymph nodes. Reproductive: Uterus and bilateral adnexa are unremarkable. Other: No abdominal wall hernia.  No ascites. Musculoskeletal: Moderate fracture of L3. Moderate fracture of T11. T11 fracture is stable from a chest CT dated 09/17/2017. L3 fracture is new since a lumbar MRI dated 10/20/2015. No other fractures. No osteoblastic or osteolytic lesions. Left hip arthroplasty appears well  seated and aligned. IMPRESSION: 1. Moderate fracture of L3 new since of lumbar spine MRI dated 10/20/2015, but of unclear chronicity. There is no associated edema or hemorrhage, which suggests that this is chronic. Old compression fracture of T11. 2. No acute findings within the abdomen or pelvis. 3. Increased stool causes moderate distention of the rectum. 4. Multiple colonic diverticula without evidence of diverticulitis. 5. Aortic atherosclerosis. Electronically Signed   By: Amie Portland M.D.   On: 11/03/2017 14:09   Dg Abd Acute W/chest  Result Date: 11/03/2017 CLINICAL DATA:  patient reports she awoke approx 2am with generalized abdominal pain. Fever in the ED this AM. Denies nausea, SOB, cough, fever, or constipation. Hx of HTN. Non-smoker EXAM: DG ABDOMEN ACUTE W/ 1V CHEST COMPARISON:  10/04/2017 FINDINGS: There is no bowel dilation to suggest obstruction or significant generalized adynamic ileus. No free air or significant air-fluid levels on the erect view. Soft tissues are not well-defined. There is no convincing renal or ureteral stone. There are scattered aortic, iliac and branch vessel vascular calcifications. Soft tissues are otherwise unremarkable. Cardiac silhouette is normal size. No mediastinal or hilar masses. Clear lungs. Elevation of right hemidiaphragm is similar to the prior exam. Skeletal structures are demineralized. There has been a previous total left hip arthroplasty. IMPRESSION: 1. No acute findings in the abdomen or pelvis. No evidence of bowel obstruction or free air. 2. No acute cardiopulmonary disease. Electronically Signed   By: Amie Portland M.D.   On: 11/03/2017 11:01    Lab Data:  CBC: Recent Labs  Lab 11/03/17 1011 11/04/17 0542  WBC 7.4 4.2  NEUTROABS 6.1  --   HGB 8.9* 8.4*  HCT 26.9* 25.7*  MCV 90.6 91.1  PLT 199 195   Basic Metabolic Panel: Recent Labs  Lab 11/03/17 1011 11/04/17 0542  NA 133* 135  K 3.6 2.9*  CL 101 106  CO2 20* 19*  GLUCOSE  116* 92  BUN 16 14  CREATININE 0.98 0.94  CALCIUM 8.7* 8.2*   GFR: Estimated Creatinine Clearance: 26.9 mL/min (by C-G formula based on SCr of 0.94 mg/dL). Liver Function Tests: Recent Labs  Lab 11/03/17 1011  AST 22  ALT 10*  ALKPHOS 58  BILITOT 0.9  PROT 5.6*  ALBUMIN 3.0*   No results for input(s): LIPASE, AMYLASE in the last 168 hours. No results for input(s): AMMONIA in the last 168 hours. Coagulation Profile: No results for input(s): INR,  PROTIME in the last 168 hours. Cardiac Enzymes: No results for input(s): CKTOTAL, CKMB, CKMBINDEX, TROPONINI in the last 168 hours. BNP (last 3 results) No results for input(s): PROBNP in the last 8760 hours. HbA1C: No results for input(s): HGBA1C in the last 72 hours. CBG: No results for input(s): GLUCAP in the last 168 hours. Lipid Profile: No results for input(s): CHOL, HDL, LDLCALC, TRIG, CHOLHDL, LDLDIRECT in the last 72 hours. Thyroid Function Tests: No results for input(s): TSH, T4TOTAL, FREET4, T3FREE, THYROIDAB in the last 72 hours. Anemia Panel: No results for input(s): VITAMINB12, FOLATE, FERRITIN, TIBC, IRON, RETICCTPCT in the last 72 hours. Urine analysis:    Component Value Date/Time   COLORURINE YELLOW 11/03/2017 1117   APPEARANCEUR CLEAR 11/03/2017 1117   LABSPEC 1.011 11/03/2017 1117   PHURINE 5.0 11/03/2017 1117   GLUCOSEU NEGATIVE 11/03/2017 1117   HGBUR SMALL (A) 11/03/2017 1117   BILIRUBINUR NEGATIVE 11/03/2017 1117   KETONESUR NEGATIVE 11/03/2017 1117   PROTEINUR NEGATIVE 11/03/2017 1117   NITRITE NEGATIVE 11/03/2017 1117   LEUKOCYTESUR TRACE (A) 11/03/2017 1117     Delores Thelen M.D. [facility] 11/04/2017, 10:15 AM  Pager: [phone] Between 7am to 7pm - call Pager - [phone]  After 7pm go to [email or web address] - password [email or web address]  Call night coverage person covering after 7pm

## 2017-11-04 NOTE — Evaluation (Signed)
Physical Therapy Evaluation Patient Details Name: Katherine Kirby MRN: [id number] DOB: 02-12-1922 Today's Date: 11/04/2017   History of Present Illness  84 y.o. female with medical history significant for mild dementia, hypertension, osteoporosis, history of thoracic and lumbar compression fractures as well as history of pelvic fracture, normocytic anemia, and stage III chronic kidney disease. Pt admitted for acute metabolic encephalopathy superimposed on dementia, likely due to UTI.     Clinical Impression  Pt admitted with above diagnosis. Pt currently with functional limitations due to the deficits listed below (see PT Problem List). On eval, pt required min guard assist for bed mobility, transfers and ambulation 150 feet. Assist primarily needed due to confusion. Pt will benefit from skilled PT to increase their independence and safety with mobility to allow discharge to the venue listed below.  PT to follow acutely. No follow up services indicated. ALF staff able to provide supervision for all mobility.      Follow Up Recommendations No PT follow up;Supervision for mobility/OOB    Equipment Recommendations  None recommended by PT    Recommendations for Other Services       Precautions / Restrictions Precautions Precautions: Fall      Mobility  Bed Mobility   Bed Mobility: Supine to Sit;Sit to Supine     Supine to sit: HOB elevated;Min guard Sit to supine: Min guard   General bed mobility comments: +rail, increased time and effort  Transfers Overall transfer level: Needs assistance Equipment used: Rolling walker (2 wheeled) Transfers: Sit to/from Stand Sit to Stand: Min guard         General transfer comment: verbal cues for hand placement  Ambulation/Gait Ambulation/Gait assistance: Min guard Ambulation Distance (Feet): 150 Feet Assistive device: Rolling walker (2 wheeled) Gait Pattern/deviations: Step-through pattern;Decreased stride length Gait velocity:  decreased   General Gait Details: steady gait. Max HR 113 during ambulation.   Stairs            Wheelchair Mobility    Modified Rankin (Stroke Patients Only)       Balance Overall balance assessment: Needs assistance Sitting-balance support: No upper extremity supported;Feet supported Sitting balance-Leahy Scale: Good     Standing balance support: Bilateral upper extremity supported;During functional activity Standing balance-Leahy Scale: Poor Standing balance comment: heavy reliance on RW                             Pertinent Vitals/Pain Pain Assessment: Faces Faces Pain Scale: No hurt    Home Living Family/patient expects to be discharged to:: Assisted living               Home Equipment: Walker - 4 wheels      Prior Function Level of Independence: Needs assistance   Gait / Transfers Assistance Needed: ambulates supervision with rollator. Supervision needed due to confusion.  ADL's / Homemaking Assistance Needed: ALF staff assists with ADLs        Hand Dominance        Extremity/Trunk Assessment   Upper Extremity Assessment Upper Extremity Assessment: Generalized weakness    Lower Extremity Assessment Lower Extremity Assessment: Generalized weakness    Cervical / Trunk Assessment Cervical / Trunk Assessment: Kyphotic  Communication   Communication: No difficulties  Cognition Arousal/Alertness: Awake/alert Behavior During Therapy: WFL for tasks assessed/performed Overall Cognitive Status: Impaired/Different from baseline Area of Impairment: Orientation;Safety/judgement;Memory;Problem solving                 Orientation  Level: Disoriented to;Situation;Time;Place   Memory: Decreased short-term memory   Safety/Judgement: Decreased awareness of safety   Problem Solving: Slow processing;Difficulty sequencing General Comments: Family present to confirm dementia/confusion at baseline but confusion gradually worsening.        General Comments      Exercises     Assessment/Plan    PT Assessment Patient needs continued PT services  PT Problem List Decreased strength;Decreased mobility;Decreased activity tolerance;Decreased balance;Decreased safety awareness       PT Treatment Interventions Therapeutic activities;Gait training;Therapeutic exercise;Patient/family education;Balance training;Functional mobility training    PT Goals (Current goals can be found in the Care Plan section)  Acute Rehab PT Goals Patient Stated Goal: feel better PT Goal Formulation: With patient/family Time For Goal Achievement: 11/18/17 Potential to Achieve Goals: Fair    Frequency Min 3X/week   Barriers to discharge        Co-evaluation               AM-PAC PT "6 Clicks" Daily Activity  Outcome Measure Difficulty turning over in bed (including adjusting bedclothes, sheets and blankets)?: A Little Difficulty moving from lying on back to sitting on the side of the bed? : A Little Difficulty sitting down on and standing up from a chair with arms (e.g., wheelchair, bedside commode, etc,.)?: A Little Help needed moving to and from a bed to chair (including a wheelchair)?: A Little Help needed walking in hospital room?: A Little Help needed climbing 3-5 steps with a railing? : A Little 6 Click Score: 18    End of Session Equipment Utilized During Treatment: Gait belt Activity Tolerance: Patient tolerated treatment well Patient left: in bed;with call bell/phone within reach;with bed alarm set Nurse Communication: Mobility status PT Visit Diagnosis: Muscle weakness (generalized) (M62.81);Unsteadiness on feet (R26.81)    Time: [id number] PT Time Calculation (min) (ACUTE ONLY): 25 min   Charges:   PT Evaluation $PT Eval Low Complexity: 1 Low PT Treatments $Gait Training: 8-22 mins   PT G Codes:        Aida RaiderWendy Lloyd Ayo, PT  Office # [phone] Pager [phone]   Ilda FoilGarrow, Rickey Farrier Rene 11/04/2017, 3:08 PM

## 2017-11-05 DIAGNOSIS — K5900 Constipation, unspecified: Secondary | ICD-10-CM

## 2017-11-05 DIAGNOSIS — N183 Chronic kidney disease, stage 3 (moderate): Secondary | ICD-10-CM

## 2017-11-05 LAB — BASIC METABOLIC PANEL
ANION GAP: 10 (ref 5–15)
BUN: 9 mg/dL (ref 6–20)
CO2: 19 mmol/L — AB (ref 22–32)
Calcium: 8.3 mg/dL — ABNORMAL LOW (ref 8.9–10.3)
Chloride: 107 mmol/L (ref 101–111)
Creatinine, Ser: 0.84 mg/dL (ref 0.44–1.00)
GFR calc non Af Amer: 58 mL/min — ABNORMAL LOW (ref >60)
Glucose, Bld: 111 mg/dL — ABNORMAL HIGH (ref 65–99)
POTASSIUM: 4.1 mmol/L (ref 3.5–5.1)
Sodium: 136 mmol/L (ref 135–145)

## 2017-11-05 LAB — URINE CULTURE: Culture: >=100000 — AB

## 2017-11-05 LAB — MAGNESIUM: Magnesium: 1.4 mg/dL — ABNORMAL LOW (ref 1.7–2.4)

## 2017-11-05 MED ORDER — POLYETHYLENE GLYCOL 3350 17 G PO PACK: 17.0000 g | PACK | Freq: Every day | ORAL | 3 refills | Status: AC | PRN

## 2017-11-05 MED ORDER — CEFUROXIME AXETIL 500 MG PO TABS
500.0000 mg | ORAL_TABLET | Freq: Two times a day (BID) | ORAL | Status: DC
  Administered 2017-11-05: 500 mg via ORAL
  Filled 2017-11-05 (×3): qty 1

## 2017-11-05 MED ORDER — METOPROLOL SUCCINATE ER 50 MG PO TB24
50.0000 mg | ORAL_TABLET | Freq: Every day | ORAL | Status: DC
  Administered 2017-11-05: 50 mg via ORAL
  Filled 2017-11-05: qty 1

## 2017-11-05 MED ORDER — HYDRALAZINE HCL 25 MG PO TABS: 25.0000 mg | ORAL_TABLET | Freq: Three times a day (TID) | ORAL | Status: DC

## 2017-11-05 MED ORDER — CEFUROXIME AXETIL 500 MG PO TABS: 500.0000 mg | ORAL_TABLET | Freq: Two times a day (BID) | ORAL | 0 refills | Status: AC

## 2017-11-05 MED ORDER — SENNOSIDES-DOCUSATE SODIUM 8.6-50 MG PO TABS: 1.0000 | ORAL_TABLET | Freq: Every day | ORAL | 0 refills | Status: DC

## 2017-11-05 MED ORDER — MAGNESIUM SULFATE 50 % IJ SOLN
3.0000 g | Freq: Once | INTRAVENOUS | Status: DC
  Filled 2017-11-05: qty 6

## 2017-11-05 MED ORDER — MAGNESIUM OXIDE 400 (241.3 MG) MG PO TABS
400.0000 mg | ORAL_TABLET | Freq: Once | ORAL | Status: AC
  Administered 2017-11-05: 400 mg via ORAL
  Filled 2017-11-05: qty 1

## 2017-11-05 NOTE — Progress Notes (Signed)
Katherine Kirby to be D/C'd Nursing Home per MD order.  Discussed with the patient and all questions fully answered.  VSS, Skin clean, dry and intact without evidence of skin break down, no evidence of skin tears noted. IV catheter discontinued intact. Site without signs and symptoms of complications. Dressing and pressure applied.  An After Visit Summary was printed and given to the patient. Patient received prescription.  D/c education completed with patient/family including follow up instructions, medication list, d/c activities limitations if indicated, with other d/c instructions as indicated by MD - patient able to verbalize understanding, all questions fully answered.   Patient instructed to return to ED, call 911, or call MD for any changes in condition.   Patient escorted via WC, and D/C home via private auto.  Report called to Dotty at facility.  Casper HarrisonSamantha K Georganna Maxson 11/05/2017 3:13 PM

## 2017-11-05 NOTE — Progress Notes (Signed)
Patient will DC to: [facility] Anticipated DC date: 11/05/17 Family notified: Daughter in Development worker, communitylaw Transport by: car   Per MD patient ready for DC to [facility]. RN, patient, patient's family, and facility notified of DC. Discharge Summary and fl2 sent to facility. RN given number for report [phone]).   CSW signing off.  Cristobal GoldmannNadia Jeany Seville, LCSW Clinical Social Worker [phone]

## 2017-11-05 NOTE — [place] FL2 (Signed)
[place] MEDICAID FL2 LEVEL OF CARE SCREENING TOOL     IDENTIFICATION  Patient Name: Katherine Kirby Birthdate: 16-Jul-1922 Sex: female Admission Date (Current Location): 11/03/2017  [facility] and [place] Number:  Producer, television/film/video and Address:  [facility] [place], [address], [place], [place] [address]      Provider Number: [id number]  Attending Physician Name and Address:  Cathren Harsh, MD  Relative Name and Phone Number:  Lurena Joiner, daughter-in-law    Current Level of Care: Hospital Recommended Level of Care: Assisted Living Facility Prior Approval Number:    Date Approved/Denied:   PASRR Number:    Discharge Plan: Other (Comment)(ALF)    Current Diagnoses: Patient Active Problem List   Diagnosis Date Noted  . Acute metabolic encephalopathy 11/03/2017  . UTI (urinary tract infection) 11/03/2017  . Constipation 11/03/2017  . Anemia 11/03/2017  . CKD (chronic kidney disease) stage 3, GFR 30-59 ml/min (HCC) 11/03/2017  . Thoracic compression fracture (HCC) 11/03/2017  . High blood pressure 11/03/2017  . Mild dementia 11/03/2017  . Abnormal urinalysis 09/17/2017  . Respiratory insufficiency 09/17/2017  . HTN (hypertension) 09/17/2017  . Acute kidney injury (HCC) 09/17/2017  . Normocytic anemia 09/17/2017  . HCAP (healthcare-associated pneumonia) 09/17/2017  . Compression fracture of lumbar spine, non-traumatic (HCC) 10/18/2015  . Lumbar compression fracture (HCC) 10/18/2015  . Spinal stenosis 10/18/2015  . Difficulty walking 10/18/2015  . UTI (lower urinary tract infection) 10/18/2015  . AKI (acute kidney injury) (HCC) 10/18/2015  . Hypertension 10/18/2015  . Hip pain   . Essential hypertension     Orientation RESPIRATION BLADDER Height & Weight     Self  Normal Incontinent Weight: 53.1 kg (117 lb) Height:  5' (152.4 cm)  BEHAVIORAL SYMPTOMS/MOOD NEUROLOGICAL BOWEL NUTRITION STATUS      Continent Diet(Regular)  AMBULATORY STATUS  COMMUNICATION OF NEEDS Skin   Limited Assist Verbally Normal                       Personal Care Assistance Level of Assistance  Bathing, Feeding, Dressing Bathing Assistance: Maximum assistance Feeding assistance: Limited assistance Dressing Assistance: Limited assistance     Functional Limitations Info  Hearing, Sight Sight Info: Impaired Hearing Info: Impaired      SPECIAL CARE FACTORS FREQUENCY  PT (By licensed PT), OT (By licensed OT)     PT Frequency: 3/wk home health PT OT Frequency: 2/wk home health OT            Contractures      Additional Factors Info  Code Status, Allergies Code Status Info: DNR Allergies Info: NKA           Current Medications (11/05/2017):   Discharge Medications: TAKE these medications   albuterol (2.5 MG/3ML) 0.083% nebulizer solution Commonly known as:  PROVENTIL Take 2.5 mg by nebulization 3 (three) times daily.   aspirin 81 MG chewable tablet Chew 81 mg by mouth daily.   bisacodyl 5 MG EC tablet Commonly known as:  DULCOLAX Take 2 tablets (10 mg total) by mouth daily as needed for moderate constipation.   calcitonin (salmon) 200 UNIT/ACT nasal spray Commonly known as:  MIACALCIN/FORTICAL Place 1 spray into alternate nostrils See admin instructions. Spray 1 spray into Right nostril every other day then alternating with left nostril   cefUROXime 500 MG tablet Commonly known as:  CEFTIN Take 1 tablet (500 mg total) by mouth 2 (two) times daily with a meal for 5 days.  hydrALAZINE 25 MG tablet Commonly known as:  APRESOLINE Take 1 tablet (25 mg total) by mouth every 8 (eight) hours. What changed:  when to take this   ipratropium-albuterol 0.5-2.5 (3) MG/3ML Soln Commonly known as:  DUONEB Take 3 mLs by nebulization 3 (three) times daily.   metoprolol succinate 50 MG 24 hr tablet Commonly known as:  TOPROL-XL Take 1 tablet (50 mg total) by mouth daily. Take with or immediately following a meal.    polyethylene glycol packet Commonly known as:  MIRALAX / GLYCOLAX Take 17 g by mouth daily as needed. What changed:  Another medication with the same name was added. Make sure you understand how and when to take each.   polyethylene glycol packet Commonly known as:  MIRALAX Take 17 g by mouth daily as needed for moderate constipation (over the counter). What changed:  You were already taking a medication with the same name, and this prescription was added. Make sure you understand how and when to take each.   senna-docusate 8.6-50 MG tablet Commonly known as:  Senokot-S Take 1 tablet by mouth at bedtime. What changed:    when to take this  reasons to take this   Vitamin B 12 100 MCG Lozg Take 100 mcg by mouth daily.      Relevant Imaging Results:  Relevant Lab Results:   Additional Information SSN: [id number]  Vona Whiters S [place], [place]

## 2017-11-05 NOTE — Clinical Social Work Note (Signed)
Clinical Social Work Assessment  Patient Details  Name: Katherine Kirby MRN: [id number] Date of Birth: 11-29-1921  Date of referral:  11/05/17               Reason for consult:  Discharge Planning                Permission sought to share information with:  Facility Medical sales representativeContact Representative, Family Supports Permission granted to share information::  No  Name::     Katherine Kirby  Agency::  [facility]  Relationship::  Son/DIL  Contact Information:  [phone]  Housing/Transportation Living arrangements for the past 2 months:  Assisted Living Facility Source of Information:  Adult Children Patient Interpreter Needed:  None Criminal Activity/Legal Involvement Pertinent to Current Situation/Hospitalization:  No - Comment as needed Significant Relationships:  Adult Children Lives with:  Facility Resident Do you feel safe going back to the place where you live?  Yes Need for family participation in patient care:  Yes (Comment)  Care giving concerns:  CSW received consult regarding discharge planning. CSW spoke with patient's son and daughter-in-law at bedside. Patient resides at [facility] and they would like her to return there at discharge. CSW to continue to follow and assist with discharge planning needs.   Social Worker assessment / plan:  CSW spoke with patient's son concerning discharge plan.  Employment status:  Retired Health and safety inspectornsurance information:  Medicare PT Recommendations:  Home with Home Health Information / Referral to community resources:  Skilled Nursing Facility  Patient/Family's Response to care:  Patient's son reports understanding of discharge plan and understand that ALF has to approve paperwork before transfer. They will take patient by car.   Patient/Family's Understanding of and Emotional Response to Diagnosis, Current Treatment, and Prognosis:  Patient/family is realistic regarding therapy needs and expressed being hopeful for ALF placement. Patient's son and  daughter in law, Lurena JoinerRebecca, expressed understanding of CSW role and discharge process as well as medical condition. No questions/concerns about plan or treatment.    Emotional Assessment Appearance:  Appears stated age Attitude/Demeanor/Rapport:  Unable to Assess Affect (typically observed):  Unable to Assess Orientation:  Oriented to Self, Oriented to Place, Oriented to Situation Alcohol / Substance use:  Not Applicable Psych involvement (Current and /or in the community):  No (Comment)  Discharge Needs  Concerns to be addressed:  Care Coordination Readmission within the last 30 days:  Yes Current discharge risk:  None Barriers to Discharge:  No Barriers Identified   Mearl Latinadia S Tahjae Durr, LCSWA 11/05/2017, 11:50 AM

## 2017-11-05 NOTE — Discharge Summary (Addendum)
Physician Discharge Summary   Patient ID: Katherine Kirby MRN: [id number] DOB/AGE: 1922-02-17 84 y.o.  Admit date: 11/03/2017 Discharge date: 11/05/2017  Primary Care Physician:  Lewis Moccasin, MD   Recommendations for Outpatient Follow-up:  1. Follow up with PCP in 1-2 weeks 2. Please obtain BMP/CBC in one week  Home Health: None , back to ALF Equipment/Devices: none   Discharge Condition: stable  CODE STATUS: DNR   Diet recommendation: heart healthy    Discharge Diagnoses:    . Acute metabolic encephalopathy . UTI (urinary tract infection)- Enterobacter . Constipation . Anemia . CKD (chronic kidney disease) stage 3, GFR 30-59 ml/min (HCC) . Thoracic compression fracture (HCC) . Essential hypertension . Mild dementia Hypokalemia Hypomagnesemia  Consults:  None    Allergies:  No Known Allergies   DISCHARGE MEDICATIONS: Allergies as of 11/05/2017   No Known Allergies     Medication List    TAKE these medications   albuterol (2.5 MG/3ML) 0.083% nebulizer solution Commonly known as:  PROVENTIL Take 2.5 mg by nebulization 3 (three) times daily.   aspirin 81 MG chewable tablet Chew 81 mg by mouth daily.   bisacodyl 5 MG EC tablet Commonly known as:  DULCOLAX Take 2 tablets (10 mg total) by mouth daily as needed for moderate constipation.   calcitonin (salmon) 200 UNIT/ACT nasal spray Commonly known as:  MIACALCIN/FORTICAL Place 1 spray into alternate nostrils See admin instructions. Spray 1 spray into Right nostril every other day then alternating with left nostril   cefUROXime 500 MG tablet Commonly known as:  CEFTIN Take 1 tablet (500 mg total) by mouth 2 (two) times daily with a meal for 5 days.   hydrALAZINE 25 MG tablet Commonly known as:  APRESOLINE Take 1 tablet (25 mg total) by mouth every 8 (eight) hours. What changed:  when to take this   ipratropium-albuterol 0.5-2.5 (3) MG/3ML Soln Commonly known as:  DUONEB Take 3 mLs by nebulization  3 (three) times daily.   metoprolol succinate 50 MG 24 hr tablet Commonly known as:  TOPROL-XL Take 1 tablet (50 mg total) by mouth daily. Take with or immediately following a meal.   polyethylene glycol packet Commonly known as:  MIRALAX / GLYCOLAX Take 17 g by mouth daily as needed. What changed:  Another medication with the same name was added. Make sure you understand how and when to take each.   polyethylene glycol packet Commonly known as:  MIRALAX Take 17 g by mouth daily as needed for moderate constipation (over the counter). What changed:  You were already taking a medication with the same name, and this prescription was added. Make sure you understand how and when to take each.   senna-docusate 8.6-50 MG tablet Commonly known as:  Senokot-S Take 1 tablet by mouth at bedtime. What changed:    when to take this  reasons to take this   Vitamin B 12 100 MCG Lozg Take 100 mcg by mouth daily.        Brief H and P: For complete details please refer to admission H and P, but in brief Patient is a 84 year old female with dementia, hypertension. Process, history of thoracic and lumbar compression fracture, normocytic anemia, stage III CKD who was recently discharged on 09/24/17 after admission for acute respiratory failure in the context of rhinovirus, syncopal episode and likely UTI. Per family she has not done well since the recent discharge, has been treated for sinus infection, another UTI and influenza. Patient presented with altered  mental status, BP was elevated in 200s at the facility. Family had noted subtle alteration in mentation beginning last week, 34 days ago. In the ED, patient had low-grade temp of 100.54F, UA concerning for UTI. CT of the abdomen and pelvis revealed rectum distended with stool moderate fracture of L3 new since MRI from 2017.  Hospital Course:     Acute metabolic encephalopathy superimposed on dementia, likely due to UTI -  currently at baseline,  patient was placed on IV antibiotics with Rocephin, IV fluids -Also has some sundowning in the evenings.       Enterobacter UTI (urinary tract infection) -  patient was placed on IV Rocephin, urine culture showed Enterobacter, transitioned to oral Ceftin for 5 more days to complete full course for 7 days    Constipation - CT abdomen showed rectum distended with stool, placed on Dulcolax supp, and received fleets enema - Continue MiraLAX as needed if no BM in over 24 hours, Senokot-s daily at bedtime or twice a day as needed    Anemia, normocytic -  likely due to anemia of chronic disease, hemoglobin at baseline 9-10 - Hemoglobin 8.4     CKD (chronic kidney disease) stage 3, GFR 30-59 ml/min (HCC) -  creatinine currently stable, no acute issues    Thoracic compression fracture (HCC), lumbar compression fractures - Currently no back pain. PT evaluation recommended to PT follow-up, back to baseline   Essential Hypertension - BP currently stable, continue Toprol-XL, hydralazine  Hypokalemia with hypomagnesemia Replaced    Day of Discharge S: Feeling better, no fevers or chills, at baseline per family members at the bedside. Had some sundowning in the evening yesterday.  BP 139/81   Pulse 80   Temp 98.1 F (36.7 C) (Oral)   Resp 18   Ht 5' (1.524 m)   Wt 53.1 kg (117 lb)   SpO2 100%   BMI 22.85 kg/m   Physical Exam: General: Alert and awake oriented, not in any acute distress. HEENT: anicteric sclera, pupils reactive to light and accommodation CVS: S1-S2 clear no murmur rubs or gallops Chest: clear to auscultation bilaterally, no wheezing rales or rhonchi Abdomen: soft nontender, nondistended, normal bowel sounds Extremities: no cyanosis, clubbing or edema noted bilaterally Neuro:no new deficits  The results of significant diagnostics from this hospitalization (including imaging, microbiology, ancillary and laboratory) are listed below for reference.       Procedures/Studies:  Ct Abdomen Pelvis W Contrast  Result Date: 11/03/2017 CLINICAL DATA:  Patient awoke approx 2am with chest pain and then at breakfast staff noticed she was not feeling good. Initial B/P was 250/110 and reverified at 225/108. Complains now of epigastric pain. EXAM: CT ABDOMEN AND PELVIS WITH CONTRAST TECHNIQUE: Multidetector CT imaging of the abdomen and pelvis was performed using the standard protocol following bolus administration of intravenous contrast. CONTRAST:  80mL ISOVUE-300 IOPAMIDOL (ISOVUE-300) INJECTION 61% COMPARISON:  Current acute abdominal series. FINDINGS: Lower chest: No acute abnormality. Hepatobiliary: No focal liver abnormality is seen. No gallstones, gallbladder wall thickening, or biliary dilatation. Pancreas: Unremarkable. No pancreatic ductal dilatation or surrounding inflammatory changes. Spleen: Normal in size without focal abnormality. Adrenals/Urinary Tract: No adrenal masses. Bilateral renal cortical thinning. Several small low-density right renal masses consistent with cysts. No other renal lesions. No stones. No hydronephrosis. Ureters are normal in course and in caliber. Bladder is unremarkable. Stomach/Bowel: Stomach is unremarkable. There is a diverticulum from the medial aspect of the duodenum at the junction of the second and third portions. Small  bowel is normal in caliber. No wall thickening or adjacent inflammation. Rectum is distended with stool. No rectal wall thickening or adjacent inflammation. There are multiple colonic diverticula. No diverticulitis or other colon inflammatory process. Normal appendix not discretely seen. No evidence of appendicitis. Vascular/Lymphatic: Aortic atherosclerosis. No pathologically enlarged lymph nodes. Reproductive: Uterus and bilateral adnexa are unremarkable. Other: No abdominal wall hernia.  No ascites. Musculoskeletal: Moderate fracture of L3. Moderate fracture of T11. T11 fracture is stable from a chest  CT dated 09/17/2017. L3 fracture is new since a lumbar MRI dated 10/20/2015. No other fractures. No osteoblastic or osteolytic lesions. Left hip arthroplasty appears well seated and aligned. IMPRESSION: 1. Moderate fracture of L3 new since of lumbar spine MRI dated 10/20/2015, but of unclear chronicity. There is no associated edema or hemorrhage, which suggests that this is chronic. Old compression fracture of T11. 2. No acute findings within the abdomen or pelvis. 3. Increased stool causes moderate distention of the rectum. 4. Multiple colonic diverticula without evidence of diverticulitis. 5. Aortic atherosclerosis. Electronically Signed   By: Amie Portland M.D.   On: 11/03/2017 14:09   Dg Abd Acute W/chest  Result Date: 11/03/2017 CLINICAL DATA:  patient reports she awoke approx 2am with generalized abdominal pain. Fever in the ED this AM. Denies nausea, SOB, cough, fever, or constipation. Hx of HTN. Non-smoker EXAM: DG ABDOMEN ACUTE W/ 1V CHEST COMPARISON:  10/04/2017 FINDINGS: There is no bowel dilation to suggest obstruction or significant generalized adynamic ileus. No free air or significant air-fluid levels on the erect view. Soft tissues are not well-defined. There is no convincing renal or ureteral stone. There are scattered aortic, iliac and branch vessel vascular calcifications. Soft tissues are otherwise unremarkable. Cardiac silhouette is normal size. No mediastinal or hilar masses. Clear lungs. Elevation of right hemidiaphragm is similar to the prior exam. Skeletal structures are demineralized. There has been a previous total left hip arthroplasty. IMPRESSION: 1. No acute findings in the abdomen or pelvis. No evidence of bowel obstruction or free air. 2. No acute cardiopulmonary disease. Electronically Signed   By: Amie Portland M.D.   On: 11/03/2017 11:01      LAB RESULTS: Basic Metabolic Panel: Recent Labs  Lab 11/04/17 0542 11/05/17 0557  NA 135 136  K 2.9* 4.1  CL 106 107  CO2  19* 19*  GLUCOSE 92 111*  BUN 14 9  CREATININE 0.94 0.84  CALCIUM 8.2* 8.3*  MG  --  1.4*   Liver Function Tests: Recent Labs  Lab 11/03/17 1011  AST 22  ALT 10*  ALKPHOS 58  BILITOT 0.9  PROT 5.6*  ALBUMIN 3.0*   No results for input(s): LIPASE, AMYLASE in the last 168 hours. No results for input(s): AMMONIA in the last 168 hours. CBC: Recent Labs  Lab 11/03/17 1011 11/04/17 0542  WBC 7.4 4.2  NEUTROABS 6.1  --   HGB 8.9* 8.4*  HCT 26.9* 25.7*  MCV 90.6 91.1  PLT 199 195   Cardiac Enzymes: No results for input(s): CKTOTAL, CKMB, CKMBINDEX, TROPONINI in the last 168 hours. BNP: Invalid input(s): POCBNP CBG: No results for input(s): GLUCAP in the last 168 hours.    Disposition and Follow-up: Discharge Instructions    Diet - low sodium heart healthy   Complete by:  As directed    Increase activity slowly   Complete by:  As directed        DISPOSITION: Back to assisted living facility  DISCHARGE FOLLOW-UP Follow-up Information  Lewis Moccasinewey, Elizabeth R, MD. Schedule an appointment as soon as possible for a visit in 2 week(s).   Specialty:  Family Medicine Contact information: [address] [place] [id number] [phone]            Time coordinating discharge:  35 minutes  Signed:   Thad Rangeripudeep Rai M.D. [facility] 11/05/2017, 11:47 AM Pager: [phone]

## 2017-11-08 LAB — CULTURE, BLOOD (ROUTINE X 2)
CULTURE: NO GROWTH
Culture: NO GROWTH
Special Requests: ADEQUATE
Special Requests: ADEQUATE

## 2017-11-09 DIAGNOSIS — I10 Essential (primary) hypertension: Secondary | ICD-10-CM | POA: Diagnosis not present

## 2017-11-09 DIAGNOSIS — N952 Postmenopausal atrophic vaginitis: Secondary | ICD-10-CM | POA: Diagnosis not present

## 2017-11-09 DIAGNOSIS — F0390 Unspecified dementia without behavioral disturbance: Secondary | ICD-10-CM | POA: Diagnosis not present

## 2017-11-09 DIAGNOSIS — Z6822 Body mass index (BMI) 22.0-22.9, adult: Secondary | ICD-10-CM | POA: Diagnosis not present

## 2017-11-09 DIAGNOSIS — N390 Urinary tract infection, site not specified: Secondary | ICD-10-CM | POA: Diagnosis not present

## 2017-11-12 DIAGNOSIS — I129 Hypertensive chronic kidney disease with stage 1 through stage 4 chronic kidney disease, or unspecified chronic kidney disease: Secondary | ICD-10-CM | POA: Diagnosis not present

## 2017-11-12 DIAGNOSIS — N183 Chronic kidney disease, stage 3 (moderate): Secondary | ICD-10-CM | POA: Diagnosis not present

## 2017-11-12 DIAGNOSIS — J690 Pneumonitis due to inhalation of food and vomit: Secondary | ICD-10-CM | POA: Diagnosis not present

## 2017-11-12 DIAGNOSIS — M810 Age-related osteoporosis without current pathological fracture: Secondary | ICD-10-CM | POA: Diagnosis not present

## 2017-11-12 DIAGNOSIS — D649 Anemia, unspecified: Secondary | ICD-10-CM | POA: Diagnosis not present

## 2017-11-12 DIAGNOSIS — M4800 Spinal stenosis, site unspecified: Secondary | ICD-10-CM | POA: Diagnosis not present

## 2017-11-15 DIAGNOSIS — I129 Hypertensive chronic kidney disease with stage 1 through stage 4 chronic kidney disease, or unspecified chronic kidney disease: Secondary | ICD-10-CM | POA: Diagnosis not present

## 2017-11-15 DIAGNOSIS — D649 Anemia, unspecified: Secondary | ICD-10-CM | POA: Diagnosis not present

## 2017-11-15 DIAGNOSIS — J690 Pneumonitis due to inhalation of food and vomit: Secondary | ICD-10-CM | POA: Diagnosis not present

## 2017-11-15 DIAGNOSIS — M4800 Spinal stenosis, site unspecified: Secondary | ICD-10-CM | POA: Diagnosis not present

## 2017-11-15 DIAGNOSIS — N183 Chronic kidney disease, stage 3 (moderate): Secondary | ICD-10-CM | POA: Diagnosis not present

## 2017-11-15 DIAGNOSIS — M810 Age-related osteoporosis without current pathological fracture: Secondary | ICD-10-CM | POA: Diagnosis not present

## 2017-11-19 DIAGNOSIS — M4800 Spinal stenosis, site unspecified: Secondary | ICD-10-CM | POA: Diagnosis not present

## 2017-11-19 DIAGNOSIS — J690 Pneumonitis due to inhalation of food and vomit: Secondary | ICD-10-CM | POA: Diagnosis not present

## 2017-11-19 DIAGNOSIS — D649 Anemia, unspecified: Secondary | ICD-10-CM | POA: Diagnosis not present

## 2017-11-19 DIAGNOSIS — I129 Hypertensive chronic kidney disease with stage 1 through stage 4 chronic kidney disease, or unspecified chronic kidney disease: Secondary | ICD-10-CM | POA: Diagnosis not present

## 2017-11-19 DIAGNOSIS — N183 Chronic kidney disease, stage 3 (moderate): Secondary | ICD-10-CM | POA: Diagnosis not present

## 2017-11-19 DIAGNOSIS — M810 Age-related osteoporosis without current pathological fracture: Secondary | ICD-10-CM | POA: Diagnosis not present

## 2017-11-22 DIAGNOSIS — M810 Age-related osteoporosis without current pathological fracture: Secondary | ICD-10-CM | POA: Diagnosis not present

## 2017-11-22 DIAGNOSIS — N183 Chronic kidney disease, stage 3 (moderate): Secondary | ICD-10-CM | POA: Diagnosis not present

## 2017-11-22 DIAGNOSIS — D649 Anemia, unspecified: Secondary | ICD-10-CM | POA: Diagnosis not present

## 2017-11-22 DIAGNOSIS — J690 Pneumonitis due to inhalation of food and vomit: Secondary | ICD-10-CM | POA: Diagnosis not present

## 2017-11-22 DIAGNOSIS — M4800 Spinal stenosis, site unspecified: Secondary | ICD-10-CM | POA: Diagnosis not present

## 2017-11-22 DIAGNOSIS — I129 Hypertensive chronic kidney disease with stage 1 through stage 4 chronic kidney disease, or unspecified chronic kidney disease: Secondary | ICD-10-CM | POA: Diagnosis not present

## 2017-11-25 DIAGNOSIS — N183 Chronic kidney disease, stage 3 (moderate): Secondary | ICD-10-CM | POA: Diagnosis not present

## 2017-11-25 DIAGNOSIS — F0390 Unspecified dementia without behavioral disturbance: Secondary | ICD-10-CM | POA: Diagnosis not present

## 2017-11-25 DIAGNOSIS — M4800 Spinal stenosis, site unspecified: Secondary | ICD-10-CM | POA: Diagnosis not present

## 2017-11-25 DIAGNOSIS — I129 Hypertensive chronic kidney disease with stage 1 through stage 4 chronic kidney disease, or unspecified chronic kidney disease: Secondary | ICD-10-CM | POA: Diagnosis not present

## 2017-11-25 DIAGNOSIS — D631 Anemia in chronic kidney disease: Secondary | ICD-10-CM | POA: Diagnosis not present

## 2017-11-25 DIAGNOSIS — M810 Age-related osteoporosis without current pathological fracture: Secondary | ICD-10-CM | POA: Diagnosis not present

## 2017-11-27 DIAGNOSIS — F0390 Unspecified dementia without behavioral disturbance: Secondary | ICD-10-CM | POA: Diagnosis not present

## 2017-11-27 DIAGNOSIS — I129 Hypertensive chronic kidney disease with stage 1 through stage 4 chronic kidney disease, or unspecified chronic kidney disease: Secondary | ICD-10-CM | POA: Diagnosis not present

## 2017-11-27 DIAGNOSIS — N183 Chronic kidney disease, stage 3 (moderate): Secondary | ICD-10-CM | POA: Diagnosis not present

## 2017-11-27 DIAGNOSIS — M4800 Spinal stenosis, site unspecified: Secondary | ICD-10-CM | POA: Diagnosis not present

## 2017-11-27 DIAGNOSIS — M810 Age-related osteoporosis without current pathological fracture: Secondary | ICD-10-CM | POA: Diagnosis not present

## 2017-11-27 DIAGNOSIS — D631 Anemia in chronic kidney disease: Secondary | ICD-10-CM | POA: Diagnosis not present

## 2017-11-29 DIAGNOSIS — M810 Age-related osteoporosis without current pathological fracture: Secondary | ICD-10-CM | POA: Diagnosis not present

## 2017-11-29 DIAGNOSIS — N183 Chronic kidney disease, stage 3 (moderate): Secondary | ICD-10-CM | POA: Diagnosis not present

## 2017-11-29 DIAGNOSIS — I129 Hypertensive chronic kidney disease with stage 1 through stage 4 chronic kidney disease, or unspecified chronic kidney disease: Secondary | ICD-10-CM | POA: Diagnosis not present

## 2017-11-29 DIAGNOSIS — D631 Anemia in chronic kidney disease: Secondary | ICD-10-CM | POA: Diagnosis not present

## 2017-11-29 DIAGNOSIS — F0390 Unspecified dementia without behavioral disturbance: Secondary | ICD-10-CM | POA: Diagnosis not present

## 2017-11-29 DIAGNOSIS — M4800 Spinal stenosis, site unspecified: Secondary | ICD-10-CM | POA: Diagnosis not present

## 2017-11-30 DIAGNOSIS — I10 Essential (primary) hypertension: Secondary | ICD-10-CM | POA: Diagnosis not present

## 2017-11-30 DIAGNOSIS — Z6822 Body mass index (BMI) 22.0-22.9, adult: Secondary | ICD-10-CM | POA: Diagnosis not present

## 2017-11-30 DIAGNOSIS — N390 Urinary tract infection, site not specified: Secondary | ICD-10-CM | POA: Diagnosis not present

## 2017-11-30 DIAGNOSIS — F0390 Unspecified dementia without behavioral disturbance: Secondary | ICD-10-CM | POA: Diagnosis not present

## 2017-12-05 DIAGNOSIS — N183 Chronic kidney disease, stage 3 (moderate): Secondary | ICD-10-CM | POA: Diagnosis not present

## 2017-12-05 DIAGNOSIS — I129 Hypertensive chronic kidney disease with stage 1 through stage 4 chronic kidney disease, or unspecified chronic kidney disease: Secondary | ICD-10-CM | POA: Diagnosis not present

## 2017-12-05 DIAGNOSIS — M810 Age-related osteoporosis without current pathological fracture: Secondary | ICD-10-CM | POA: Diagnosis not present

## 2017-12-05 DIAGNOSIS — D631 Anemia in chronic kidney disease: Secondary | ICD-10-CM | POA: Diagnosis not present

## 2017-12-05 DIAGNOSIS — F0390 Unspecified dementia without behavioral disturbance: Secondary | ICD-10-CM | POA: Diagnosis not present

## 2017-12-05 DIAGNOSIS — M4800 Spinal stenosis, site unspecified: Secondary | ICD-10-CM | POA: Diagnosis not present

## 2017-12-06 DIAGNOSIS — N952 Postmenopausal atrophic vaginitis: Secondary | ICD-10-CM | POA: Diagnosis not present

## 2017-12-06 DIAGNOSIS — B373 Candidiasis of vulva and vagina: Secondary | ICD-10-CM | POA: Diagnosis not present

## 2017-12-06 DIAGNOSIS — N760 Acute vaginitis: Secondary | ICD-10-CM | POA: Diagnosis not present

## 2017-12-06 DIAGNOSIS — F0390 Unspecified dementia without behavioral disturbance: Secondary | ICD-10-CM | POA: Diagnosis not present

## 2017-12-06 DIAGNOSIS — R197 Diarrhea, unspecified: Secondary | ICD-10-CM | POA: Diagnosis not present

## 2017-12-07 DIAGNOSIS — D631 Anemia in chronic kidney disease: Secondary | ICD-10-CM | POA: Diagnosis not present

## 2017-12-07 DIAGNOSIS — N183 Chronic kidney disease, stage 3 (moderate): Secondary | ICD-10-CM | POA: Diagnosis not present

## 2017-12-07 DIAGNOSIS — I129 Hypertensive chronic kidney disease with stage 1 through stage 4 chronic kidney disease, or unspecified chronic kidney disease: Secondary | ICD-10-CM | POA: Diagnosis not present

## 2017-12-07 DIAGNOSIS — F0390 Unspecified dementia without behavioral disturbance: Secondary | ICD-10-CM | POA: Diagnosis not present

## 2017-12-07 DIAGNOSIS — M810 Age-related osteoporosis without current pathological fracture: Secondary | ICD-10-CM | POA: Diagnosis not present

## 2017-12-07 DIAGNOSIS — M4800 Spinal stenosis, site unspecified: Secondary | ICD-10-CM | POA: Diagnosis not present

## 2017-12-10 DIAGNOSIS — M810 Age-related osteoporosis without current pathological fracture: Secondary | ICD-10-CM | POA: Diagnosis not present

## 2017-12-10 DIAGNOSIS — N183 Chronic kidney disease, stage 3 (moderate): Secondary | ICD-10-CM | POA: Diagnosis not present

## 2017-12-10 DIAGNOSIS — M4800 Spinal stenosis, site unspecified: Secondary | ICD-10-CM | POA: Diagnosis not present

## 2017-12-10 DIAGNOSIS — D631 Anemia in chronic kidney disease: Secondary | ICD-10-CM | POA: Diagnosis not present

## 2017-12-10 DIAGNOSIS — I129 Hypertensive chronic kidney disease with stage 1 through stage 4 chronic kidney disease, or unspecified chronic kidney disease: Secondary | ICD-10-CM | POA: Diagnosis not present

## 2017-12-10 DIAGNOSIS — F0390 Unspecified dementia without behavioral disturbance: Secondary | ICD-10-CM | POA: Diagnosis not present

## 2017-12-12 DIAGNOSIS — I129 Hypertensive chronic kidney disease with stage 1 through stage 4 chronic kidney disease, or unspecified chronic kidney disease: Secondary | ICD-10-CM | POA: Diagnosis not present

## 2017-12-12 DIAGNOSIS — D631 Anemia in chronic kidney disease: Secondary | ICD-10-CM | POA: Diagnosis not present

## 2017-12-12 DIAGNOSIS — N183 Chronic kidney disease, stage 3 (moderate): Secondary | ICD-10-CM | POA: Diagnosis not present

## 2017-12-13 DIAGNOSIS — F0390 Unspecified dementia without behavioral disturbance: Secondary | ICD-10-CM | POA: Diagnosis not present

## 2017-12-13 DIAGNOSIS — N183 Chronic kidney disease, stage 3 (moderate): Secondary | ICD-10-CM | POA: Diagnosis not present

## 2017-12-13 DIAGNOSIS — I129 Hypertensive chronic kidney disease with stage 1 through stage 4 chronic kidney disease, or unspecified chronic kidney disease: Secondary | ICD-10-CM | POA: Diagnosis not present

## 2017-12-13 DIAGNOSIS — M4800 Spinal stenosis, site unspecified: Secondary | ICD-10-CM | POA: Diagnosis not present

## 2017-12-13 DIAGNOSIS — D631 Anemia in chronic kidney disease: Secondary | ICD-10-CM | POA: Diagnosis not present

## 2017-12-13 DIAGNOSIS — M810 Age-related osteoporosis without current pathological fracture: Secondary | ICD-10-CM | POA: Diagnosis not present

## 2017-12-18 DIAGNOSIS — D631 Anemia in chronic kidney disease: Secondary | ICD-10-CM | POA: Diagnosis not present

## 2017-12-18 DIAGNOSIS — M810 Age-related osteoporosis without current pathological fracture: Secondary | ICD-10-CM | POA: Diagnosis not present

## 2017-12-18 DIAGNOSIS — N183 Chronic kidney disease, stage 3 (moderate): Secondary | ICD-10-CM | POA: Diagnosis not present

## 2017-12-18 DIAGNOSIS — F0390 Unspecified dementia without behavioral disturbance: Secondary | ICD-10-CM | POA: Diagnosis not present

## 2017-12-18 DIAGNOSIS — I129 Hypertensive chronic kidney disease with stage 1 through stage 4 chronic kidney disease, or unspecified chronic kidney disease: Secondary | ICD-10-CM | POA: Diagnosis not present

## 2017-12-18 DIAGNOSIS — M4800 Spinal stenosis, site unspecified: Secondary | ICD-10-CM | POA: Diagnosis not present

## 2017-12-20 DIAGNOSIS — I129 Hypertensive chronic kidney disease with stage 1 through stage 4 chronic kidney disease, or unspecified chronic kidney disease: Secondary | ICD-10-CM | POA: Diagnosis not present

## 2017-12-20 DIAGNOSIS — D631 Anemia in chronic kidney disease: Secondary | ICD-10-CM | POA: Diagnosis not present

## 2017-12-20 DIAGNOSIS — N183 Chronic kidney disease, stage 3 (moderate): Secondary | ICD-10-CM | POA: Diagnosis not present

## 2017-12-20 DIAGNOSIS — M4800 Spinal stenosis, site unspecified: Secondary | ICD-10-CM | POA: Diagnosis not present

## 2017-12-20 DIAGNOSIS — F0390 Unspecified dementia without behavioral disturbance: Secondary | ICD-10-CM | POA: Diagnosis not present

## 2017-12-20 DIAGNOSIS — M810 Age-related osteoporosis without current pathological fracture: Secondary | ICD-10-CM | POA: Diagnosis not present

## 2017-12-25 DIAGNOSIS — F0390 Unspecified dementia without behavioral disturbance: Secondary | ICD-10-CM | POA: Diagnosis not present

## 2017-12-25 DIAGNOSIS — N183 Chronic kidney disease, stage 3 (moderate): Secondary | ICD-10-CM | POA: Diagnosis not present

## 2017-12-25 DIAGNOSIS — M4800 Spinal stenosis, site unspecified: Secondary | ICD-10-CM | POA: Diagnosis not present

## 2017-12-25 DIAGNOSIS — D631 Anemia in chronic kidney disease: Secondary | ICD-10-CM | POA: Diagnosis not present

## 2017-12-25 DIAGNOSIS — M810 Age-related osteoporosis without current pathological fracture: Secondary | ICD-10-CM | POA: Diagnosis not present

## 2017-12-25 DIAGNOSIS — I129 Hypertensive chronic kidney disease with stage 1 through stage 4 chronic kidney disease, or unspecified chronic kidney disease: Secondary | ICD-10-CM | POA: Diagnosis not present

## 2017-12-26 DIAGNOSIS — M4800 Spinal stenosis, site unspecified: Secondary | ICD-10-CM | POA: Diagnosis not present

## 2017-12-26 DIAGNOSIS — M810 Age-related osteoporosis without current pathological fracture: Secondary | ICD-10-CM | POA: Diagnosis not present

## 2017-12-26 DIAGNOSIS — D631 Anemia in chronic kidney disease: Secondary | ICD-10-CM | POA: Diagnosis not present

## 2017-12-26 DIAGNOSIS — F0390 Unspecified dementia without behavioral disturbance: Secondary | ICD-10-CM | POA: Diagnosis not present

## 2017-12-26 DIAGNOSIS — I129 Hypertensive chronic kidney disease with stage 1 through stage 4 chronic kidney disease, or unspecified chronic kidney disease: Secondary | ICD-10-CM | POA: Diagnosis not present

## 2017-12-26 DIAGNOSIS — N183 Chronic kidney disease, stage 3 (moderate): Secondary | ICD-10-CM | POA: Diagnosis not present

## 2017-12-27 DIAGNOSIS — M4800 Spinal stenosis, site unspecified: Secondary | ICD-10-CM | POA: Diagnosis not present

## 2017-12-27 DIAGNOSIS — I129 Hypertensive chronic kidney disease with stage 1 through stage 4 chronic kidney disease, or unspecified chronic kidney disease: Secondary | ICD-10-CM | POA: Diagnosis not present

## 2017-12-27 DIAGNOSIS — M810 Age-related osteoporosis without current pathological fracture: Secondary | ICD-10-CM | POA: Diagnosis not present

## 2017-12-27 DIAGNOSIS — F0390 Unspecified dementia without behavioral disturbance: Secondary | ICD-10-CM | POA: Diagnosis not present

## 2017-12-27 DIAGNOSIS — N183 Chronic kidney disease, stage 3 (moderate): Secondary | ICD-10-CM | POA: Diagnosis not present

## 2017-12-27 DIAGNOSIS — D631 Anemia in chronic kidney disease: Secondary | ICD-10-CM | POA: Diagnosis not present

## 2018-01-01 DIAGNOSIS — M810 Age-related osteoporosis without current pathological fracture: Secondary | ICD-10-CM | POA: Diagnosis not present

## 2018-01-01 DIAGNOSIS — F0390 Unspecified dementia without behavioral disturbance: Secondary | ICD-10-CM | POA: Diagnosis not present

## 2018-01-01 DIAGNOSIS — N183 Chronic kidney disease, stage 3 (moderate): Secondary | ICD-10-CM | POA: Diagnosis not present

## 2018-01-01 DIAGNOSIS — D631 Anemia in chronic kidney disease: Secondary | ICD-10-CM | POA: Diagnosis not present

## 2018-01-01 DIAGNOSIS — M4800 Spinal stenosis, site unspecified: Secondary | ICD-10-CM | POA: Diagnosis not present

## 2018-01-01 DIAGNOSIS — I129 Hypertensive chronic kidney disease with stage 1 through stage 4 chronic kidney disease, or unspecified chronic kidney disease: Secondary | ICD-10-CM | POA: Diagnosis not present

## 2018-01-08 DIAGNOSIS — B351 Tinea unguium: Secondary | ICD-10-CM | POA: Diagnosis not present

## 2018-01-08 DIAGNOSIS — F0390 Unspecified dementia without behavioral disturbance: Secondary | ICD-10-CM | POA: Diagnosis not present

## 2018-01-08 DIAGNOSIS — M4800 Spinal stenosis, site unspecified: Secondary | ICD-10-CM | POA: Diagnosis not present

## 2018-01-08 DIAGNOSIS — N183 Chronic kidney disease, stage 3 (moderate): Secondary | ICD-10-CM | POA: Diagnosis not present

## 2018-01-08 DIAGNOSIS — M810 Age-related osteoporosis without current pathological fracture: Secondary | ICD-10-CM | POA: Diagnosis not present

## 2018-01-08 DIAGNOSIS — D631 Anemia in chronic kidney disease: Secondary | ICD-10-CM | POA: Diagnosis not present

## 2018-01-08 DIAGNOSIS — M2010 Hallux valgus (acquired), unspecified foot: Secondary | ICD-10-CM | POA: Diagnosis not present

## 2018-01-08 DIAGNOSIS — L603 Nail dystrophy: Secondary | ICD-10-CM | POA: Diagnosis not present

## 2018-01-08 DIAGNOSIS — Q845 Enlarged and hypertrophic nails: Secondary | ICD-10-CM | POA: Diagnosis not present

## 2018-01-08 DIAGNOSIS — L853 Xerosis cutis: Secondary | ICD-10-CM | POA: Diagnosis not present

## 2018-01-08 DIAGNOSIS — I129 Hypertensive chronic kidney disease with stage 1 through stage 4 chronic kidney disease, or unspecified chronic kidney disease: Secondary | ICD-10-CM | POA: Diagnosis not present

## 2018-01-08 DIAGNOSIS — I739 Peripheral vascular disease, unspecified: Secondary | ICD-10-CM | POA: Diagnosis not present

## 2018-01-10 DIAGNOSIS — I129 Hypertensive chronic kidney disease with stage 1 through stage 4 chronic kidney disease, or unspecified chronic kidney disease: Secondary | ICD-10-CM | POA: Diagnosis not present

## 2018-01-10 DIAGNOSIS — F0390 Unspecified dementia without behavioral disturbance: Secondary | ICD-10-CM | POA: Diagnosis not present

## 2018-01-10 DIAGNOSIS — M4800 Spinal stenosis, site unspecified: Secondary | ICD-10-CM | POA: Diagnosis not present

## 2018-01-10 DIAGNOSIS — N183 Chronic kidney disease, stage 3 (moderate): Secondary | ICD-10-CM | POA: Diagnosis not present

## 2018-01-10 DIAGNOSIS — D631 Anemia in chronic kidney disease: Secondary | ICD-10-CM | POA: Diagnosis not present

## 2018-01-10 DIAGNOSIS — M810 Age-related osteoporosis without current pathological fracture: Secondary | ICD-10-CM | POA: Diagnosis not present

## 2018-01-30 DIAGNOSIS — I10 Essential (primary) hypertension: Secondary | ICD-10-CM | POA: Diagnosis not present

## 2018-01-30 DIAGNOSIS — B373 Candidiasis of vulva and vagina: Secondary | ICD-10-CM | POA: Diagnosis not present

## 2018-01-30 DIAGNOSIS — Z6822 Body mass index (BMI) 22.0-22.9, adult: Secondary | ICD-10-CM | POA: Diagnosis not present

## 2018-01-30 DIAGNOSIS — N390 Urinary tract infection, site not specified: Secondary | ICD-10-CM | POA: Diagnosis not present

## 2018-04-03 ENCOUNTER — Inpatient Hospital Stay (HOSPITAL_COMMUNITY): Payer: Medicare Other

## 2018-04-03 ENCOUNTER — Emergency Department (HOSPITAL_COMMUNITY): Payer: Medicare Other

## 2018-04-03 ENCOUNTER — Other Ambulatory Visit: Payer: Self-pay

## 2018-04-03 ENCOUNTER — Encounter (HOSPITAL_COMMUNITY): Payer: Self-pay | Admitting: *Deleted

## 2018-04-03 ENCOUNTER — Inpatient Hospital Stay (HOSPITAL_COMMUNITY)
Admission: EM | Admit: 2018-04-03 | Discharge: 2018-04-05 | DRG: 689 | Disposition: A | Discharge status: Nursing Home (Includes ICF) | Payer: Medicare Other | Attending: Internal Medicine | Admitting: Internal Medicine

## 2018-04-03 DIAGNOSIS — N183 Chronic kidney disease, stage 3 unspecified: Secondary | ICD-10-CM | POA: Diagnosis present

## 2018-04-03 DIAGNOSIS — R262 Difficulty in walking, not elsewhere classified: Secondary | ICD-10-CM | POA: Diagnosis present

## 2018-04-03 DIAGNOSIS — Z79899 Other long term (current) drug therapy: Secondary | ICD-10-CM

## 2018-04-03 DIAGNOSIS — G9341 Metabolic encephalopathy: Secondary | ICD-10-CM | POA: Diagnosis present

## 2018-04-03 DIAGNOSIS — E860 Dehydration: Secondary | ICD-10-CM | POA: Diagnosis present

## 2018-04-03 DIAGNOSIS — I10 Essential (primary) hypertension: Secondary | ICD-10-CM | POA: Diagnosis present

## 2018-04-03 DIAGNOSIS — I129 Hypertensive chronic kidney disease with stage 1 through stage 4 chronic kidney disease, or unspecified chronic kidney disease: Secondary | ICD-10-CM | POA: Diagnosis present

## 2018-04-03 DIAGNOSIS — Z96642 Presence of left artificial hip joint: Secondary | ICD-10-CM | POA: Diagnosis present

## 2018-04-03 DIAGNOSIS — G9340 Encephalopathy, unspecified: Secondary | ICD-10-CM | POA: Insufficient documentation

## 2018-04-03 DIAGNOSIS — Z79818 Long term (current) use of other agents affecting estrogen receptors and estrogen levels: Secondary | ICD-10-CM

## 2018-04-03 DIAGNOSIS — F05 Delirium due to known physiological condition: Secondary | ICD-10-CM | POA: Diagnosis present

## 2018-04-03 DIAGNOSIS — F03A0 Unspecified dementia, mild, without behavioral disturbance, psychotic disturbance, mood disturbance, and anxiety: Secondary | ICD-10-CM | POA: Diagnosis present

## 2018-04-03 DIAGNOSIS — J449 Chronic obstructive pulmonary disease, unspecified: Secondary | ICD-10-CM | POA: Diagnosis present

## 2018-04-03 DIAGNOSIS — R4182 Altered mental status, unspecified: Secondary | ICD-10-CM | POA: Diagnosis not present

## 2018-04-03 DIAGNOSIS — B961 Klebsiella pneumoniae [K. pneumoniae] as the cause of diseases classified elsewhere: Secondary | ICD-10-CM | POA: Diagnosis present

## 2018-04-03 DIAGNOSIS — R1111 Vomiting without nausea: Secondary | ICD-10-CM | POA: Diagnosis not present

## 2018-04-03 DIAGNOSIS — R0902 Hypoxemia: Secondary | ICD-10-CM | POA: Diagnosis not present

## 2018-04-03 DIAGNOSIS — M4856XA Collapsed vertebra, not elsewhere classified, lumbar region, initial encounter for fracture: Secondary | ICD-10-CM | POA: Diagnosis present

## 2018-04-03 DIAGNOSIS — Z1629 Resistance to other single specified antibiotic: Secondary | ICD-10-CM | POA: Diagnosis present

## 2018-04-03 DIAGNOSIS — D649 Anemia, unspecified: Secondary | ICD-10-CM | POA: Diagnosis present

## 2018-04-03 DIAGNOSIS — Z66 Do not resuscitate: Secondary | ICD-10-CM | POA: Diagnosis present

## 2018-04-03 DIAGNOSIS — D638 Anemia in other chronic diseases classified elsewhere: Secondary | ICD-10-CM | POA: Diagnosis present

## 2018-04-03 DIAGNOSIS — R5381 Other malaise: Secondary | ICD-10-CM | POA: Diagnosis present

## 2018-04-03 DIAGNOSIS — N179 Acute kidney failure, unspecified: Secondary | ICD-10-CM | POA: Diagnosis present

## 2018-04-03 DIAGNOSIS — F0390 Unspecified dementia without behavioral disturbance: Secondary | ICD-10-CM | POA: Diagnosis present

## 2018-04-03 DIAGNOSIS — Z515 Encounter for palliative care: Secondary | ICD-10-CM | POA: Diagnosis present

## 2018-04-03 DIAGNOSIS — Z7982 Long term (current) use of aspirin: Secondary | ICD-10-CM

## 2018-04-03 DIAGNOSIS — N390 Urinary tract infection, site not specified: Secondary | ICD-10-CM | POA: Diagnosis not present

## 2018-04-03 DIAGNOSIS — Z1611 Resistance to penicillins: Secondary | ICD-10-CM | POA: Diagnosis present

## 2018-04-03 DIAGNOSIS — R4020 Unspecified coma: Secondary | ICD-10-CM | POA: Diagnosis not present

## 2018-04-03 DIAGNOSIS — M4800 Spinal stenosis, site unspecified: Secondary | ICD-10-CM | POA: Diagnosis present

## 2018-04-03 LAB — CBC WITH DIFFERENTIAL/PLATELET
ABS IMMATURE GRANULOCYTES: 0 10*3/uL (ref 0.0–0.1)
BASOS PCT: 0 %
Basophils Absolute: 0 10*3/uL (ref 0.0–0.1)
Eosinophils Absolute: 0.2 10*3/uL (ref 0.0–0.7)
Eosinophils Relative: 4 %
HCT: 30.8 % — ABNORMAL LOW (ref 36.0–46.0)
HEMOGLOBIN: 9.8 g/dL — AB (ref 12.0–15.0)
Immature Granulocytes: 1 %
LYMPHS PCT: 14 %
Lymphs Abs: 0.7 10*3/uL (ref 0.7–4.0)
MCH: 30.6 pg (ref 26.0–34.0)
MCHC: 31.8 g/dL (ref 30.0–36.0)
MCV: 96.3 fL (ref 78.0–100.0)
MONO ABS: 0.8 10*3/uL (ref 0.1–1.0)
Monocytes Relative: 15 %
NEUTROS ABS: 3.4 10*3/uL (ref 1.7–7.7)
Neutrophils Relative %: 66 %
Platelets: 177 10*3/uL (ref 150–400)
RBC: 3.2 MIL/uL — ABNORMAL LOW (ref 3.87–5.11)
RDW: 12.8 % (ref 11.5–15.5)
WBC: 5.1 10*3/uL (ref 4.0–10.5)

## 2018-04-03 LAB — URINALYSIS, ROUTINE W REFLEX MICROSCOPIC
Bilirubin Urine: NEGATIVE
GLUCOSE, UA: NEGATIVE mg/dL
HGB URINE DIPSTICK: NEGATIVE
Ketones, ur: NEGATIVE mg/dL
NITRITE: POSITIVE — AB
Protein, ur: NEGATIVE mg/dL
Specific Gravity, Urine: 1.011 (ref 1.005–1.030)
pH: 5 (ref 5.0–8.0)

## 2018-04-03 LAB — COMPREHENSIVE METABOLIC PANEL
ALK PHOS: 51 U/L (ref 38–126)
ALT: 12 U/L (ref 0–44)
AST: 16 U/L (ref 15–41)
Albumin: 3.4 g/dL — ABNORMAL LOW (ref 3.5–5.0)
Anion gap: 8 (ref 5–15)
BUN: 35 mg/dL — ABNORMAL HIGH (ref 8–23)
CALCIUM: 9.4 mg/dL (ref 8.9–10.3)
CO2: 21 mmol/L — ABNORMAL LOW (ref 22–32)
CREATININE: 1.27 mg/dL — AB (ref 0.44–1.00)
Chloride: 107 mmol/L (ref 98–111)
GFR, EST AFRICAN AMERICAN: 41 mL/min — AB (ref >60)
GFR, EST NON AFRICAN AMERICAN: 35 mL/min — AB (ref >60)
Glucose, Bld: 95 mg/dL (ref 70–99)
Potassium: 4.8 mmol/L (ref 3.5–5.1)
Sodium: 136 mmol/L (ref 135–145)
Total Bilirubin: 0.5 mg/dL (ref 0.3–1.2)
Total Protein: 5.9 g/dL — ABNORMAL LOW (ref 6.5–8.1)

## 2018-04-03 LAB — TROPONIN I

## 2018-04-03 LAB — AMMONIA: Ammonia: 10 umol/L (ref 9–35)

## 2018-04-03 LAB — CBG MONITORING, ED: Glucose-Capillary: 95 mg/dL (ref 70–99)

## 2018-04-03 MED ORDER — ENOXAPARIN SODIUM 40 MG/0.4ML ~~LOC~~ SOLN: 40.0000 mg | SUBCUTANEOUS | Status: DC

## 2018-04-03 MED ORDER — NYSTATIN 100000 UNIT/GM EX CREA
1.0000 "application " | TOPICAL_CREAM | Freq: Every day | CUTANEOUS | Status: DC | PRN
  Filled 2018-04-03: qty 15

## 2018-04-03 MED ORDER — HYDRALAZINE HCL 25 MG PO TABS
25.0000 mg | ORAL_TABLET | Freq: Three times a day (TID) | ORAL | Status: DC
  Administered 2018-04-03 – 2018-04-05 (×7): 25 mg via ORAL
  Filled 2018-04-03 (×7): qty 1

## 2018-04-03 MED ORDER — ONDANSETRON HCL 4 MG/2ML IJ SOLN: 4.0000 mg | Freq: Four times a day (QID) | INTRAMUSCULAR | Status: DC | PRN

## 2018-04-03 MED ORDER — METOPROLOL SUCCINATE ER 50 MG PO TB24
50.0000 mg | ORAL_TABLET | Freq: Every day | ORAL | Status: DC
  Administered 2018-04-03 – 2018-04-05 (×3): 50 mg via ORAL
  Filled 2018-04-03 (×3): qty 1

## 2018-04-03 MED ORDER — BISACODYL 5 MG PO TBEC: 10.0000 mg | DELAYED_RELEASE_TABLET | Freq: Every day | ORAL | Status: DC | PRN

## 2018-04-03 MED ORDER — CALCITONIN (SALMON) 200 UNIT/ACT NA SOLN
1.0000 | Freq: Every day | NASAL | Status: DC
  Administered 2018-04-03 – 2018-04-04 (×2): 1 via NASAL
  Filled 2018-04-03: qty 3.7

## 2018-04-03 MED ORDER — ALBUTEROL SULFATE (2.5 MG/3ML) 0.083% IN NEBU: 2.5000 mg | INHALATION_SOLUTION | RESPIRATORY_TRACT | Status: DC | PRN

## 2018-04-03 MED ORDER — SODIUM CHLORIDE 0.9 % IV SOLN
INTRAVENOUS | Status: DC
  Administered 2018-04-03 – 2018-04-04 (×3): via INTRAVENOUS

## 2018-04-03 MED ORDER — SODIUM CHLORIDE 0.9 % IV BOLUS
1000.0000 mL | Freq: Once | INTRAVENOUS | Status: AC
  Administered 2018-04-03: 1000 mL via INTRAVENOUS

## 2018-04-03 MED ORDER — SODIUM CHLORIDE 0.9 % IV SOLN
1.0000 g | INTRAVENOUS | Status: DC
  Administered 2018-04-04 – 2018-04-05 (×2): 1 g via INTRAVENOUS
  Filled 2018-04-03 (×2): qty 10

## 2018-04-03 MED ORDER — IPRATROPIUM BROMIDE 0.02 % IN SOLN: 0.5000 mg | Freq: Two times a day (BID) | RESPIRATORY_TRACT | Status: DC | PRN

## 2018-04-03 MED ORDER — VITAMIN B 12 100 MCG PO LOZG: 100.0000 ug | LOZENGE | Freq: Every day | ORAL | Status: DC

## 2018-04-03 MED ORDER — ESTRADIOL 0.1 MG/GM VA CREA
1.0000 g | TOPICAL_CREAM | Freq: Every day | VAGINAL | Status: DC
  Administered 2018-04-03 – 2018-04-04 (×2): 1 g via VAGINAL
  Filled 2018-04-03: qty 42.5

## 2018-04-03 MED ORDER — ASPIRIN 81 MG PO CHEW
81.0000 mg | CHEWABLE_TABLET | Freq: Every day | ORAL | Status: DC
  Administered 2018-04-03 – 2018-04-05 (×3): 81 mg via ORAL
  Filled 2018-04-03 (×3): qty 1

## 2018-04-03 MED ORDER — POLYETHYLENE GLYCOL 3350 17 G PO PACK: 17.0000 g | PACK | Freq: Every day | ORAL | Status: DC | PRN

## 2018-04-03 MED ORDER — ACETAMINOPHEN 650 MG RE SUPP: 650.0000 mg | Freq: Four times a day (QID) | RECTAL | Status: DC | PRN

## 2018-04-03 MED ORDER — ACETAMINOPHEN 325 MG PO TABS: 650.0000 mg | ORAL_TABLET | Freq: Four times a day (QID) | ORAL | Status: DC | PRN

## 2018-04-03 MED ORDER — SODIUM CHLORIDE 0.9 % IV SOLN
1.0000 g | Freq: Once | INTRAVENOUS | Status: AC
  Administered 2018-04-03: 1 g via INTRAVENOUS
  Filled 2018-04-03: qty 10

## 2018-04-03 MED ORDER — ENOXAPARIN SODIUM 30 MG/0.3ML ~~LOC~~ SOLN
30.0000 mg | SUBCUTANEOUS | Status: DC
  Administered 2018-04-04 – 2018-04-05 (×2): 30 mg via SUBCUTANEOUS
  Filled 2018-04-03 (×2): qty 0.3

## 2018-04-03 MED ORDER — ONDANSETRON HCL 4 MG PO TABS
4.0000 mg | ORAL_TABLET | Freq: Four times a day (QID) | ORAL | Status: DC | PRN
Start: 2018-04-03 — End: 2018-04-05

## 2018-04-03 NOTE — ED Provider Notes (Signed)
[facility]   CSN: [id number] Arrival date & time: 04/03/18  0418     History   Chief Complaint Chief Complaint  Patient presents with  . Altered Mental Status    HPI Katherine Kirby is a 84 y.o. female.  Level 5 caveat for altered mental status.  Patient from nursing facility with change in mental status.  EMS states the patient is normally awakened every 2 hours to go to the bathroom.  She was normal at 1:30 AM and when they woke her up at 3:30 AM she was minimally responsive and not answering questions and not acting like herself.  She does have a history of dementia and an uncertain baseline.  She did not have any focal deficits.  She has been minimally verbal but stable vital signs.  CBG was 90.  Patient with history of anemia, CKD, dementia and hypertension.  The history is provided by the patient and the EMS personnel. The history is limited by the condition of the patient.  Altered Mental Status      Past Medical History:  Diagnosis Date  . Anemia   . CKD (chronic kidney disease) stage 3, GFR 30-59 ml/min (HCC)   . Dementia   . Hypertension   . Lumbar compression fracture (HCC)   . Pelvis fracture (HCC)   . Thoracic compression fracture [facility](HCC)     Patient Active Problem List   Diagnosis Date Noted  . Acute metabolic encephalopathy 11/03/2017  . UTI (urinary tract infection) 11/03/2017  . Constipation 11/03/2017  . Anemia 11/03/2017  . CKD (chronic kidney disease) stage 3, GFR 30-59 ml/min (HCC) 11/03/2017  . Thoracic compression fracture (HCC) 11/03/2017  . High blood pressure 11/03/2017  . Mild dementia 11/03/2017  . Abnormal urinalysis 09/17/2017  . Respiratory insufficiency 09/17/2017  . HTN (hypertension) 09/17/2017  . Acute kidney injury (HCC) 09/17/2017  . Normocytic anemia 09/17/2017  . HCAP (healthcare-associated pneumonia) 09/17/2017  . Compression fracture of lumbar spine, non-traumatic (HCC)  10/18/2015  . Lumbar compression fracture (HCC) 10/18/2015  . Spinal stenosis 10/18/2015  . Difficulty walking 10/18/2015  . UTI (lower urinary tract infection) 10/18/2015  . AKI (acute kidney injury) (HCC) 10/18/2015  . Hypertension 10/18/2015  . Hip pain   . Essential hypertension     Past Surgical History:  Procedure Laterality Date  . TONSILLECTOMY    . TOTAL HIP ARTHROPLASTY Left 2007     OB History   None      Home Medications    Prior to Admission medications   Medication Sig Start Date End Date Taking? Authorizing Provider  albuterol (PROVENTIL) (2.5 MG/3ML) 0.083% nebulizer solution Take 2.5 mg by nebulization 3 (three) times daily.    [provider]  aspirin 81 MG chewable tablet Chew 81 mg by mouth daily.    [provider]  bisacodyl (DULCOLAX) 5 MG EC tablet Take 2 tablets (10 mg total) by mouth daily as needed for moderate constipation. 09/24/17   Regalado, Belkys A, MD  calcitonin, salmon, (MIACALCIN/FORTICAL) 200 UNIT/ACT nasal spray Place 1 spray into alternate nostrils See admin instructions. Spray 1 spray into Right nostril every other day then alternating with left nostril    [provider]  Cyanocobalamin (VITAMIN B 12) 100 MCG LOZG Take 100 mcg by mouth daily.     [provider]  hydrALAZINE (APRESOLINE) 25 MG tablet Take 1 tablet (25 mg total) by mouth every 8 (eight) hours. Patient taking differently: Take 25  mg by mouth 2 (two) times daily.  09/24/17   Regalado, Belkys A, MD  ipratropium-albuterol (DUONEB) 0.5-2.5 (3) MG/3ML SOLN Take 3 mLs by nebulization 3 (three) times daily. 09/24/17   Regalado, Belkys A, MD  metoprolol succinate (TOPROL-XL) 50 MG 24 hr tablet Take 1 tablet (50 mg total) by mouth daily. Take with or immediately following a meal. 09/25/17   Regalado, Belkys A, MD  polyethylene glycol (MIRALAX / GLYCOLAX) packet Take 17 g by mouth daily as needed. 09/24/17   Regalado, Belkys A, MD  polyethylene glycol  (MIRALAX) packet Take 17 g by mouth daily as needed for moderate constipation (over the counter). 11/05/17   Rai, Ripudeep K, MD  senna-docusate (SENOKOT-S) 8.6-50 MG tablet Take 1 tablet by mouth at bedtime. 11/05/17   Cathren Harsh, MD    Family History No family history on file.  Social History Social History   Tobacco Use  . Smoking status: Never Smoker  . Smokeless tobacco: Never Used  Substance Use Topics  . Alcohol use: Not Currently    Comment: ocasional  . Drug use: No     Allergies   Patient has no known allergies.   Review of Systems Review of Systems  Unable to perform ROS: Mental status change     Physical Exam Updated Vital Signs BP (!) 168/73 (BP Location: Left Arm)   Pulse 69   Temp (!) 97.5 F (36.4 C) (Temporal)   Resp 18   Ht 5' (1.524 m)   Wt 57.2 kg (126 lb)   SpO2 99%   BMI 24.61 kg/m   Physical Exam  Constitutional: She appears well-developed and well-nourished. No distress.  Obtunded, responds to pain  HENT:  Head: Normocephalic and atraumatic.  Mouth/Throat: Oropharynx is clear and moist. No oropharyngeal exudate.  Dry mucus membranes.  Eyes: Pupils are equal, round, and reactive to light. Conjunctivae and EOM are normal.  Neck: Normal range of motion. Neck supple.  No meningismus.  Cardiovascular: Normal rate, regular rhythm, normal heart sounds and intact distal pulses.  No murmur heard. Pulmonary/Chest: Effort normal and breath sounds normal. No respiratory distress. She exhibits no tenderness.  Abdominal: Soft. There is no tenderness. There is no rebound and no guarding.  Musculoskeletal: Normal range of motion. She exhibits no edema or tenderness.  Neurological: She is alert. No cranial nerve deficit. She exhibits normal muscle tone. Coordination normal.  Mostly nonverbal, moans to pain, moves all extremities, follows some commands   Skin: Skin is warm. Capillary refill takes less than 2 seconds. No rash noted.  Psychiatric: She  has a normal mood and affect. Her behavior is normal.  Nursing note and vitals reviewed.    ED Treatments / Results  Labs (all labs ordered are listed, but only abnormal results are displayed) Labs Reviewed  CBC WITH DIFFERENTIAL/PLATELET - Abnormal; Notable for the following components:      Result Value   RBC 3.20 (*)    Hemoglobin 9.8 (*)    HCT 30.8 (*)    All other components within normal limits  COMPREHENSIVE METABOLIC PANEL - Abnormal; Notable for the following components:   CO2 21 (*)    BUN 35 (*)    Creatinine, Ser 1.27 (*)    Total Protein 5.9 (*)    Albumin 3.4 (*)    GFR calc non Af Amer 35 (*)    GFR calc Af Amer 41 (*)    All other components within normal limits  URINALYSIS, ROUTINE W REFLEX  MICROSCOPIC - Abnormal; Notable for the following components:   Nitrite POSITIVE (*)    Leukocytes, UA SMALL (*)    Bacteria, UA MANY (*)    All other components within normal limits  URINE CULTURE  TROPONIN I  AMMONIA  CBG MONITORING, ED    EKG EKG Interpretation  Date/Time:  Wednesday April 03 2018 04:24:51 EDT Ventricular Rate:  79 PR Interval:    QRS Duration: 93 QT Interval:  380 QTC Calculation: 436 R Axis:   -20 Text Interpretation:  Sinus rhythm Probable left atrial enlargement Abnormal R-wave progression, early transition Inferior infarct, old No significant change was found Confirmed by Glynn Octave [phone]) on 04/03/2018 4:35:25 AM   Radiology Ct Head Wo Contrast  Result Date: 04/03/2018 CLINICAL DATA:  Altered level of consciousness EXAM: CT HEAD WITHOUT CONTRAST TECHNIQUE: Contiguous axial images were obtained from the base of the skull through the vertex without intravenous contrast. COMPARISON:  10/04/2017 FINDINGS: Brain: There is atrophy and chronic small vessel disease changes. No acute intracranial abnormality. Specifically, no hemorrhage, hydrocephalus, mass lesion, acute infarction, or significant intracranial injury. Vascular: No  hyperdense vessel or unexpected calcification. Skull: No acute calvarial abnormality. Sinuses/Orbits: Visualized paranasal sinuses and mastoids clear. Orbital soft tissues unremarkable. Other: None IMPRESSION: No acute intracranial abnormality. Atrophy, chronic microvascular disease. Electronically Signed   By: Charlett Nose M.D.   On: 04/03/2018 07:14   Dg Chest Portable 1 View  Result Date: 04/03/2018 CLINICAL DATA:  84 year old with altered mental status. EXAM: PORTABLE CHEST 1 VIEW COMPARISON:  Radiograph 11/03/2017, chest CT 09/17/2017 FINDINGS: Chronic elevation of right hemidiaphragm. Unchanged heart size and mediastinal contours with aortic atherosclerosis. Chronic bronchitic change without focal consolidation. No pleural effusion or pneumothorax. Multiple overlying monitoring leads. IMPRESSION: Chronic bronchitic change without acute abnormality. Aortic Atherosclerosis (ICD10-I70.0). Electronically Signed   By: Rubye Oaks M.D.   On: 04/03/2018 05:04    Procedures Procedures (including critical care time)  Medications Ordered in ED Medications - No data to display   Initial Impression / Assessment and Plan / ED Course  I have reviewed the triage vital signs and the nursing notes.  Pertinent labs & imaging results that were available during my care of the patient were reviewed by me and considered in my medical decision making (see chart for details).    Patient from nursing facility with change in mental status.  Stable vital signs and afebrile.  Last seen normal was 1:30 AM.  She does not have any focal neurological deficits.  Labs remarkable for mild AKI.  Patient is hydrated.  Urinalysis consistent with infection.  Culture sent.  She did have a Enterobacter UTI in March that was sensitive to Rocephin.  CT head is stable. Patient is becoming somewhat more alert but still not at her baseline per family.  She is answering some questions and denies pain. She is moving all  extremities.  She is able to recognize her son.  Admission versus discharge discussed with family.  PA-C [place]ert[place] of Hospitalist service also discussed with family.  They feel patient is not quite back to her baseline and is somewhat more confused and obtunded.  They would like her to be observed in the hospital today  Admission discussed with [facility].  Final Clinical Impressions(s) / ED Diagnoses   Final diagnoses:  Acute encephalopathy  Urinary tract infection without hematuria, site unspecified    ED Discharge Orders    None       Klee Kolek, Jeannett Senior, MD 04/03/18 [phone]

## 2018-04-03 NOTE — ED Notes (Signed)
Family  Remains at bedside  States patient is more alert to them and starting to talk some. Aware they are waiting for CT head.

## 2018-04-03 NOTE — ED Triage Notes (Signed)
Patient presents to ed via [facility] from [facility] with alter loc , ems states NH gets patient up every 2 hours to go to the bathroom, got her up at 130am and she was her norm. At 330am patient had ALOC, upon arrival to ed patient is non-verbal, however will obey commands. CBG for ems was 89,EMS states patient is a DNR no paper work available, Son  At bedside and confirms DNR however they do not have paper work with them.

## 2018-04-03 NOTE — H&P (Signed)
History and Physical    Katherine Kirby ZHY:[id number] DOB: 1922/01/19 DOA: 04/03/2018   PCP: Lewis Moccasin, MD   Patient coming from: Assisted living Chief Complaint: Decreased level of responsiveness   HPI: Katherine Kirby is a 84 y.o. female living in a assisted living facility with medical history significant for dementia, hypertension, CKD stage III, anemia, prior history of pelvic fracture, history of thoracic compression fracture, and several admissions for acute mental status changes due to UTI, most recently in March 2019, who is normally awake and every 2 hours to go to the bathroom, last seen normal at 1:30 in the morning, but when they woke her up at 3:30 in the morning, she was minimally responsive, not answering questions, and "not acting like herself ".  At the facility, is reported that she did not have any focal deficits.  History is now obtained by her family members, who state that these symptoms are similar to dose of her prior admission for UTI.  No apparent fever or chills.They report that although her appetite is normal, she has not been taking in of fluids.  They deny any changes in her medications.  There are no aware of the patient complaining of any pain, dizziness, vertigo, or frequent falls.  They deny the patient having any complaints of headaches.  They are not aware of the patient complaining of abdominal pain, suprapubic pain, dysuria or hematuria, and she wears diapers.  She was in her usual state of health until yesterday.  No history of alcohol, or recreational drug use.  Family denies the patient having any complaints of respiratory or cardiac issues other information cannot be obtained, due to level 5 caveat.  ED Course:  BP (!) 168/73 (BP Location: Left Arm)   Pulse 69   Temp (!) 97.5 F (36.4 C) (Temporal)   Resp 18   Ht 5' (1.524 m)   Wt 57.2 kg (126 lb)   SpO2 99%   BMI 24.61 kg/m   UA was positive for nitrites, many bacteria.  Culture is pending.   Last urine culture in admission in March 2019, showed greater than 100,000 colonies of Enterobacter species. EKG shows sinus rhythm, without significant change from prior. CT of the head negative for acute changes. Glucose 95, creatinine 1.27, ammonia 10  Troponin  0.03 White count 5.1, hemoglobin 9.8, platelets 177  review of Systems: Other information cannot be obtained, due to level 5 caveat due to confusion. Past Medical History:  Diagnosis Date  . Anemia   . CKD (chronic kidney disease) stage 3, GFR 30-59 ml/min (HCC)   . Dementia   . Hypertension   . Lumbar compression fracture (HCC)   . Pelvis fracture (HCC)   . Thoracic compression fracture [facility])     Past Surgical History:  Procedure Laterality Date  . TONSILLECTOMY    . TOTAL HIP ARTHROPLASTY Left 2007    Social History Social History   Socioeconomic History  . Marital status: Widowed    Spouse name: Not on file  . Number of children: Not on file  . Years of education: Not on file  . Highest education level: Not on file  Occupational History  . Not on file  Social Needs  . Financial resource strain: Not on file  . Food insecurity:    Worry: Not on file    Inability: Not on file  . Transportation needs:    Medical: Not on file    Non-medical: Not on file  Tobacco Use  .  Smoking status: Never Smoker  . Smokeless tobacco: Never Used  Substance and Sexual Activity  . Alcohol use: Not Currently    Comment: ocasional  . Drug use: No  . Sexual activity: Not on file  Lifestyle  . Physical activity:    Days per week: Not on file    Minutes per session: Not on file  . Stress: Not on file  Relationships  . Social connections:    Talks on phone: Not on file    Gets together: Not on file    Attends religious service: Not on file    Active member of club or organization: Not on file    Attends meetings of clubs or organizations: Not on file    Relationship status: Not on file  . Intimate partner violence:      Fear of current or ex partner: Not on file    Emotionally abused: Not on file    Physically abused: Not on file    Forced sexual activity: Not on file  Other Topics Concern  . Not on file  Social History Narrative  . Not on file     No Known Allergies  No family history on file.     Prior to Admission medications   Medication Sig Start Date End Date Taking? Authorizing Provider  albuterol (PROVENTIL) (2.5 MG/3ML) 0.083% nebulizer solution Take 2.5 mg by nebulization every 4 (four) hours as needed (for wheezing or coughing).    Yes [provider]  aspirin 81 MG chewable tablet Chew 81 mg by mouth daily.   Yes [provider]  bisacodyl (DULCOLAX) 5 MG EC tablet Take 2 tablets (10 mg total) by mouth daily as needed for moderate constipation. 09/24/17  Yes Regalado, Belkys A, MD  calcitonin, salmon, (MIACALCIN/FORTICAL) 200 UNIT/ACT nasal spray Place 1 spray into alternate nostrils See admin instructions. Spray 1 spray into Right nostril every other day then alternating with left nostril   Yes [provider]  Calcium Carb-Cholecalciferol (CALCIUM 600+D3 PO) Take 1 tablet by mouth daily with breakfast.   Yes [provider]  Cyanocobalamin (VITAMIN B 12) 100 MCG LOZG Take 100 mcg by mouth daily.    Yes [provider]  estradiol (ESTRACE) 0.1 MG/GM vaginal cream Place 1 g vaginally daily.   Yes [provider]  hydrALAZINE (APRESOLINE) 25 MG tablet Take 1 tablet (25 mg total) by mouth every 8 (eight) hours. Patient taking differently: Take 25 mg by mouth 3 (three) times daily with meals.  09/24/17  Yes Regalado, Belkys A, MD  ipratropium (ATROVENT) 0.02 % nebulizer solution Take 0.5 mg by nebulization 2 (two) times daily as needed for wheezing or shortness of breath.   Yes [provider]  metoprolol succinate (TOPROL-XL) 50 MG 24 hr tablet Take 1 tablet (50 mg total) by mouth daily. Take with or immediately following a meal.  09/25/17  Yes Regalado, Belkys A, MD  nystatin cream (MYCOSTATIN) Apply 1 application topically See admin instructions. Apply to affected areas two times a day as needed for diaper rash or redness   Yes [provider]  olmesartan (BENICAR) 40 MG tablet Take 40 mg by mouth daily.   Yes [provider]  polyethylene glycol (MIRALAX) packet Take 17 g by mouth daily as needed for moderate constipation (over the counter). Patient taking differently: Take 17 g by mouth daily as needed for moderate constipation.  11/05/17  Yes Rai, Ripudeep K, MD  sulfamethoxazole-trimethoprim (BACTRIM,SEPTRA) 400-80 MG tablet Take 1  tablet by mouth every other day.   Yes [provider]  ipratropium-albuterol (DUONEB) 0.5-2.5 (3) MG/3ML SOLN Take 3 mLs by nebulization 3 (three) times daily. Patient not taking: Reported on 04/03/2018 09/24/17   Regalado, Jon BillingsBelkys A, MD  polyethylene glycol (MIRALAX / GLYCOLAX) packet Take 17 g by mouth daily as needed. Patient not taking: Reported on 04/03/2018 09/24/17   Regalado, Jon BillingsBelkys A, MD  senna-docusate (SENOKOT-S) 8.6-50 MG tablet Take 1 tablet by mouth at bedtime. Patient not taking: Reported on 04/03/2018 11/05/17   Cathren Harshai, Ripudeep K, MD     Physical Exam:  Vitals:   04/03/18 0440 04/03/18 0441 04/03/18 0442  BP: (!) 168/73 (!) 168/73   Pulse: 66 69   Resp: 16 18   Temp: 97.8 F (36.6 C) (!) 97.5 F (36.4 C)   TempSrc: Temporal Temporal   SpO2: 99% 99%   Weight:   57.2 kg (126 lb)  Height:   5' (1.524 m)   Constitutional: NAD, awake, but unable to engage in conversation.  Nonseptic appearing Eyes: PERRL, lids and conjunctivae normal ENMT: Mucous membranes are dry, without exudate or lesions  Neck: normal, supple, no masses, no thyromegaly Respiratory: clear to auscultation bilaterally, no wheezing, no crackles. Normal respiratory effort  Cardiovascular: Regular rate and rhythm,  murmur, rubs or gallops. No extremity edema. 2+ pedal pulses. No  carotid bruits.  Abdomen: Soft, non tender, No hepatosplenomegaly. Bowel sounds positive.  Musculoskeletal: no clubbing / cyanosis. Moves all extremities Skin: no jaundice, No lesions.  Neurologic: Sensation intact  Strength unable to be tested, as the patient cannot interact Psychiatric: Awake, but seems somewhat obtunded.    Labs on Admission: I have personally reviewed following labs and imaging studies  CBC: Recent Labs  Lab 04/03/18 0439  WBC 5.1  NEUTROABS 3.4  HGB 9.8*  HCT 30.8*  MCV 96.3  PLT 177    Basic Metabolic Panel: Recent Labs  Lab 04/03/18 0439  NA 136  K 4.8  CL 107  CO2 21*  GLUCOSE 95  BUN 35*  CREATININE 1.27*  CALCIUM 9.4    GFR: Estimated Creatinine Clearance: 21.5 mL/min (A) (by C-G formula based on SCr of 1.27 mg/dL (H)).  Liver Function Tests: Recent Labs  Lab 04/03/18 0439  AST 16  ALT 12  ALKPHOS 51  BILITOT 0.5  PROT 5.9*  ALBUMIN 3.4*   No results for input(s): LIPASE, AMYLASE in the last 168 hours. Recent Labs  Lab 04/03/18 0439  AMMONIA 10    Coagulation Profile: No results for input(s): INR, PROTIME in the last 168 hours.  Cardiac Enzymes: Recent Labs  Lab 04/03/18 0439  TROPONINI <0.03    BNP (last 3 results) No results for input(s): PROBNP in the last 8760 hours.  HbA1C: No results for input(s): HGBA1C in the last 72 hours.  CBG: Recent Labs  Lab 04/03/18 0425  GLUCAP 95    Lipid Profile: No results for input(s): CHOL, HDL, LDLCALC, TRIG, CHOLHDL, LDLDIRECT in the last 72 hours.  Thyroid Function Tests: No results for input(s): TSH, T4TOTAL, FREET4, T3FREE, THYROIDAB in the last 72 hours.  Anemia Panel: No results for input(s): VITAMINB12, FOLATE, FERRITIN, TIBC, IRON, RETICCTPCT in the last 72 hours.  Urine analysis:    Component Value Date/Time   COLORURINE YELLOW 04/03/2018 0429   APPEARANCEUR CLEAR 04/03/2018 0429   LABSPEC 1.011 04/03/2018 0429   PHURINE 5.0 04/03/2018 0429    GLUCOSEU NEGATIVE 04/03/2018 0429   HGBUR NEGATIVE 04/03/2018 0429   BILIRUBINUR  NEGATIVE 04/03/2018 0429   KETONESUR NEGATIVE 04/03/2018 0429   PROTEINUR NEGATIVE 04/03/2018 0429   NITRITE POSITIVE (A) 04/03/2018 0429   LEUKOCYTESUR SMALL (A) 04/03/2018 0429    Sepsis Labs: @LABRCNTIP (procalcitonin:4,lacticidven:4) )No results found for this or any previous visit (from the past 240 hour(s)).   Radiological Exams on Admission: Ct Head Wo Contrast  Result Date: 04/03/2018 CLINICAL DATA:  Altered level of consciousness EXAM: CT HEAD WITHOUT CONTRAST TECHNIQUE: Contiguous axial images were obtained from the base of the skull through the vertex without intravenous contrast. COMPARISON:  10/04/2017 FINDINGS: Brain: There is atrophy and chronic small vessel disease changes. No acute intracranial abnormality. Specifically, no hemorrhage, hydrocephalus, mass lesion, acute infarction, or significant intracranial injury. Vascular: No hyperdense vessel or unexpected calcification. Skull: No acute calvarial abnormality. Sinuses/Orbits: Visualized paranasal sinuses and mastoids clear. Orbital soft tissues unremarkable. Other: None IMPRESSION: No acute intracranial abnormality. Atrophy, chronic microvascular disease. Electronically Signed   By: Charlett Nose M.D.   On: 04/03/2018 07:14   Dg Chest Portable 1 View  Result Date: 04/03/2018 CLINICAL DATA:  84 year old with altered mental status. EXAM: PORTABLE CHEST 1 VIEW COMPARISON:  Radiograph 11/03/2017, chest CT 09/17/2017 FINDINGS: Chronic elevation of right hemidiaphragm. Unchanged heart size and mediastinal contours with aortic atherosclerosis. Chronic bronchitic change without focal consolidation. No pleural effusion or pneumothorax. Multiple overlying monitoring leads. IMPRESSION: Chronic bronchitic change without acute abnormality. Aortic Atherosclerosis (ICD10-I70.0). Electronically Signed   By: Rubye Oaks M.D.   On: 04/03/2018 05:04    EKG:  Independently reviewed.  Assessment/Plan Principal Problem:   Acute confusional state of infective origin Active Problems:   Compression fracture of lumbar spine, non-traumatic (HCC)   Urinary tract infection   Spinal stenosis   Difficulty walking   Essential hypertension   Anemia   CKD (chronic kidney disease) stage 3, GFR 30-59 ml/min (HCC)   Mild dementia  Acute Confusional State likely due UTI, in the setting of mild dementia, and possible SNF psychosis   CT head is negative for acute intracranial abnormalities. No seizures noted. Afebrile. UA was positive for nitrites, many bacteria.  Culture is pending.  Last urine culture in admission in March 2019, showed greater than 100,000 colonies of Enterobacter species. ammonia 10.White count 5.1. No apparent symptoms of dysuria, hematuria, frequency, urgency, although this is not reliable due to patient's dementia . Received IL IVF and IV Rocephin, family report her AMS is improving, Med-surg obs  Neuro checks q 4 h Lactic acid Follow Urine culture   Hold home oral sedative medications  PT/OT Continue Ceftriaxone and IVF at 75 cc/h    Anemia of chronic disease Hemoglobin on admission 9.8 at baseline  Repeat CBC in am  No transfusion is indicated at this time   Hypertension BP  168/73  Pulse 69   Continue home anti-hypertensive medications    Acute on Chronic CKD stage 3: likely due to dehydration  Current Cr is 1.27.  Baseline 0.8 Received 1 L IVNS at the ED  Lab Results  Component Value Date   CREATININE 1.27 (H) 04/03/2018   CREATININE 0.84 11/05/2017   CREATININE 0.94 11/04/2017  Hold ARBS   IVF at 75 cc/h Follow BMP daily    History of thoracic and lumbar fractures, spinal stenosis, chronic debilitation  no reports of back pain or falls  PT and OT  Pain med prn will try to minimize narcotics    DVT prophylaxis:  Lovenox Code Status:    DNR  Family Communication:  Discussed  with patient's family Disposition Plan:  Expect patient to be discharged to Assisted Living after condition improves Consults called:    None  Admission status:  Med Surg Obs    Marlowe Kays, PA-C [facility]   Amion text  [phone]   04/03/2018, 7:50 AM

## 2018-04-04 ENCOUNTER — Other Ambulatory Visit: Payer: Self-pay

## 2018-04-04 DIAGNOSIS — Z7189 Other specified counseling: Secondary | ICD-10-CM | POA: Diagnosis not present

## 2018-04-04 DIAGNOSIS — Z1629 Resistance to other single specified antibiotic: Secondary | ICD-10-CM | POA: Diagnosis present

## 2018-04-04 DIAGNOSIS — Z1611 Resistance to penicillins: Secondary | ICD-10-CM | POA: Diagnosis present

## 2018-04-04 DIAGNOSIS — D638 Anemia in other chronic diseases classified elsewhere: Secondary | ICD-10-CM | POA: Diagnosis present

## 2018-04-04 DIAGNOSIS — G9340 Encephalopathy, unspecified: Secondary | ICD-10-CM | POA: Diagnosis not present

## 2018-04-04 DIAGNOSIS — F05 Delirium due to known physiological condition: Secondary | ICD-10-CM

## 2018-04-04 DIAGNOSIS — B961 Klebsiella pneumoniae [K. pneumoniae] as the cause of diseases classified elsewhere: Secondary | ICD-10-CM | POA: Diagnosis present

## 2018-04-04 DIAGNOSIS — M4856XA Collapsed vertebra, not elsewhere classified, lumbar region, initial encounter for fracture: Secondary | ICD-10-CM | POA: Diagnosis present

## 2018-04-04 DIAGNOSIS — N390 Urinary tract infection, site not specified: Secondary | ICD-10-CM | POA: Diagnosis present

## 2018-04-04 DIAGNOSIS — N183 Chronic kidney disease, stage 3 (moderate): Secondary | ICD-10-CM | POA: Diagnosis present

## 2018-04-04 DIAGNOSIS — I10 Essential (primary) hypertension: Secondary | ICD-10-CM

## 2018-04-04 DIAGNOSIS — R262 Difficulty in walking, not elsewhere classified: Secondary | ICD-10-CM | POA: Diagnosis present

## 2018-04-04 DIAGNOSIS — F0390 Unspecified dementia without behavioral disturbance: Secondary | ICD-10-CM | POA: Diagnosis present

## 2018-04-04 DIAGNOSIS — Z79818 Long term (current) use of other agents affecting estrogen receptors and estrogen levels: Secondary | ICD-10-CM | POA: Diagnosis not present

## 2018-04-04 DIAGNOSIS — Z515 Encounter for palliative care: Secondary | ICD-10-CM | POA: Diagnosis present

## 2018-04-04 DIAGNOSIS — N179 Acute kidney failure, unspecified: Secondary | ICD-10-CM | POA: Diagnosis present

## 2018-04-04 DIAGNOSIS — Z7982 Long term (current) use of aspirin: Secondary | ICD-10-CM | POA: Diagnosis not present

## 2018-04-04 DIAGNOSIS — G9341 Metabolic encephalopathy: Secondary | ICD-10-CM | POA: Diagnosis present

## 2018-04-04 DIAGNOSIS — M4800 Spinal stenosis, site unspecified: Secondary | ICD-10-CM | POA: Diagnosis present

## 2018-04-04 DIAGNOSIS — Z96642 Presence of left artificial hip joint: Secondary | ICD-10-CM | POA: Diagnosis present

## 2018-04-04 DIAGNOSIS — E860 Dehydration: Secondary | ICD-10-CM | POA: Diagnosis present

## 2018-04-04 DIAGNOSIS — J449 Chronic obstructive pulmonary disease, unspecified: Secondary | ICD-10-CM | POA: Diagnosis present

## 2018-04-04 DIAGNOSIS — Z79899 Other long term (current) drug therapy: Secondary | ICD-10-CM | POA: Diagnosis not present

## 2018-04-04 DIAGNOSIS — I129 Hypertensive chronic kidney disease with stage 1 through stage 4 chronic kidney disease, or unspecified chronic kidney disease: Secondary | ICD-10-CM | POA: Diagnosis present

## 2018-04-04 DIAGNOSIS — R5381 Other malaise: Secondary | ICD-10-CM | POA: Diagnosis present

## 2018-04-04 DIAGNOSIS — Z66 Do not resuscitate: Secondary | ICD-10-CM | POA: Diagnosis present

## 2018-04-04 LAB — BASIC METABOLIC PANEL
ANION GAP: 7 (ref 5–15)
BUN: 20 mg/dL (ref 8–23)
CALCIUM: 8.8 mg/dL — AB (ref 8.9–10.3)
CHLORIDE: 114 mmol/L — AB (ref 98–111)
CO2: 20 mmol/L — AB (ref 22–32)
CREATININE: 0.92 mg/dL (ref 0.44–1.00)
GFR calc non Af Amer: 52 mL/min — ABNORMAL LOW (ref >60)
GFR, EST AFRICAN AMERICAN: 60 mL/min — AB (ref >60)
Glucose, Bld: 98 mg/dL (ref 70–99)
Potassium: 4.4 mmol/L (ref 3.5–5.1)
Sodium: 141 mmol/L (ref 135–145)

## 2018-04-04 LAB — CBC
HCT: 29.1 % — ABNORMAL LOW (ref 36.0–46.0)
HEMOGLOBIN: 9.2 g/dL — AB (ref 12.0–15.0)
MCH: 30.4 pg (ref 26.0–34.0)
MCHC: 31.6 g/dL (ref 30.0–36.0)
MCV: 96 fL (ref 78.0–100.0)
Platelets: 158 10*3/uL (ref 150–400)
RBC: 3.03 MIL/uL — AB (ref 3.87–5.11)
RDW: 13.1 % (ref 11.5–15.5)
WBC: 4 10*3/uL (ref 4.0–10.5)

## 2018-04-04 NOTE — Progress Notes (Signed)
PROGRESS NOTE    Gaylord ShihRachel Elliff  NWG:[id number] DOB: [birth year] DOA: 04/03/2018 PCP: Lewis Moccasinewey, Elizabeth R, MD    Brief Narrative:  84 year old female who presented with altered mental status.  She does have significant past medical history for dementia, hypertension and stage III chronic kidney disease.  Patient had poorly acute area, noted to be confused and disoriented, decreased responsiveness.  Her initial physical examination blood pressure 168/73, heart rate 69, temperature 97.5, respiratory rate 18, oxygen saturation 99%.  Patient was encephalopathic, dry mucous membranes, lungs clear to auscultation bilaterally, heart S1-S2 present rhythmic, abdomen soft nontender, no lower extremity edema.  Patient was admitted to the hospital working diagnosis of metabolic encephalopathy due to urinary tract infection.  Assessment & Plan:   Principal Problem:   Acute confusional state of infective origin Active Problems:   Compression fracture of lumbar spine, non-traumatic (HCC)   Spinal stenosis   Difficulty walking   Essential hypertension   Urinary tract infection   Anemia   CKD (chronic kidney disease) stage 3, GFR 30-59 ml/min (HCC)   Mild dementia  1. Metabolic encephalopathy due to urinary tract infection. Will continue antibiotic therapy with ceftriaxone, urine culture positive for klebsiella, follow with sensitivities. Continue neuro checks per unit protocol, physical therapy.   2. COPD. No signs of exacerbation, continue bronchodilator therapy, oxymetry monitoring.   3, HTN. Continue metoprolol and hydralazine for blood pressure control.   4. Dementia. Patient is more awake and alert, continue supportive medical therapy and neuro checks per unit protocol.   DVT prophylaxis: enoxaparin   Code Status: dnr  Family Communication: I spoke with patient's family at the bedside and all questions were addressed.  Disposition Plan/ discharge barriers: pending sensitivities from urine  culture    Consultants:     Procedures:     Antimicrobials:       Subjective: Patient is feeling better but not back to baseline, no nausea or vomiting, no fever or chills.   Objective: Vitals:   04/04/18 0653 04/04/18 0854 04/04/18 1014 04/04/18 1139  BP:  (!) 146/82 103/67 (!) 164/78  Pulse:  80 63 70  Resp:  18  18  Temp:  98.4 F (36.9 C) 98.4 F (36.9 C) 97.8 F (36.6 C)  TempSrc:  Oral Oral Oral  SpO2:  95% 98% 99%  Weight: 54.3 kg (119 lb 11.4 oz)     Height:        Intake/Output Summary (Last 24 hours) at 04/04/2018 1708 Last data filed at 04/04/2018 1200 Gross per 24 hour  Intake 1972.59 ml  Output -  Net 1972.59 ml   Filed Weights   04/03/18 0442 04/04/18 0653  Weight: 57.2 kg (126 lb) 54.3 kg (119 lb 11.4 oz)    Examination:   General: Not in pain or dyspnea, deconditioned  Neurology: Awake and alert, non focal  E ENT: mild pallor, no icterus, oral mucosa moist Cardiovascular: No JVD. S1-S2 present, rhythmic, no gallops, rubs, or murmurs. No lower extremity edema. Pulmonary: vesicular breath sounds bilaterally, adequate air movement, no wheezing, rhonchi or rales. Gastrointestinal. Abdomen with no organomegaly, non tender, no rebound or guarding Skin. No rashes Musculoskeletal: no joint deformities     Data Reviewed: I have personally reviewed following labs and imaging studies  CBC: Recent Labs  Lab 04/03/18 0439 04/04/18 0506  WBC 5.1 4.0  NEUTROABS 3.4  --   HGB 9.8* 9.2*  HCT 30.8* 29.1*  MCV 96.3 96.0  PLT 177 158   Basic Metabolic Panel:  Recent Labs  Lab 04/03/18 0439 04/04/18 0506  NA 136 141  K 4.8 4.4  CL 107 114*  CO2 21* 20*  GLUCOSE 95 98  BUN 35* 20  CREATININE 1.27* 0.92  CALCIUM 9.4 8.8*   GFR: Estimated Creatinine Clearance: 26.9 mL/min (by C-G formula based on SCr of 0.92 mg/dL). Liver Function Tests: Recent Labs  Lab 04/03/18 0439  AST 16  ALT 12  ALKPHOS 51  BILITOT 0.5  PROT 5.9*  ALBUMIN  3.4*   No results for input(s): LIPASE, AMYLASE in the last 168 hours. Recent Labs  Lab 04/03/18 0439  AMMONIA 10   Coagulation Profile: No results for input(s): INR, PROTIME in the last 168 hours. Cardiac Enzymes: Recent Labs  Lab 04/03/18 0439  TROPONINI <0.03   BNP (last 3 results) No results for input(s): PROBNP in the last 8760 hours. HbA1C: No results for input(s): HGBA1C in the last 72 hours. CBG: Recent Labs  Lab 04/03/18 0425  GLUCAP 95   Lipid Profile: No results for input(s): CHOL, HDL, LDLCALC, TRIG, CHOLHDL, LDLDIRECT in the last 72 hours. Thyroid Function Tests: No results for input(s): TSH, T4TOTAL, FREET4, T3FREE, THYROIDAB in the last 72 hours. Anemia Panel: No results for input(s): VITAMINB12, FOLATE, FERRITIN, TIBC, IRON, RETICCTPCT in the last 72 hours.    Radiology Studies: I have reviewed all of the imaging during this hospital visit personally     Scheduled Meds: . aspirin  81 mg Oral Daily  . calcitonin (salmon)  1 spray Alternating Nares QHS  . enoxaparin (LOVENOX) injection  30 mg Subcutaneous Q24H  . estradiol  1 g Vaginal QHS  . hydrALAZINE  25 mg Oral TID WC  . metoprolol succinate  50 mg Oral Daily   Continuous Infusions: . sodium chloride 75 mL/hr at 04/04/18 0121  . cefTRIAXone (ROCEPHIN)  IV 1 g (04/04/18 0645)     LOS: 1 day        Herson Prichard Annett Gula, MD [facility] Pager [phone]

## 2018-04-04 NOTE — Consult Note (Signed)
Consultation Note Date: 04/04/2018   Patient Name: Katherine Kirby  DOB: [birth year]  MRN: [id number]  Age / Sex: 84 y.o., female  PCP: Fanny Bien, MD Referring Physician: Tawni Millers  Reason for Consultation: Establishing goals of care  HPI/Patient Profile: 84 y.o. female  with past medical history of dementia, HTN, CKD admitted on 04/03/2018 with AMS likely related to UTI.  She currently resides at ALF, and has been admitted twice in the last 6 months.  Palliative consulted for goals of care.  Clinical Assessment and Goals of Care:  I met with patient's son and daughter in law.  Discussed her clinical course this admission as well as continued declines in nutrition, cognition, and functional status over the last several years.  Discussed that family and feeling as well as possible for as long as possible are most important things to her.  Family reports goal is to treat treatable conditions, but also understands that she has chronic, progressive illness (dementia) that is going to continue to progress and continue to create further complications.  We reviewed a MOST form and discussed how to develop plan of care to focus on continuing therapies that would maximize chance of being well enough to return home (to ALF) and limiting therapies not in line with this goal.  I discussed with family regarding heroic interventions at the end-of-life. There is agreement this would not be in line with prior expressed wishes for a natural death or be likely to lead to getting well enough to go back home. Family in agreement  CODE STATUS of DO NOT RESUSCITATE.  We completed MOST form today. DNR, Limited additional interventions, IVF and ABX if indicated, no feeding tube.  We also discussed that the hospital can be useful as long as Ms. Herling is getting well enough from care she receives at the hospital to  enjoy time at home, but there is going to come a time where, if her goal is to be at home, she may be better served to plan on being at home and bringing care to her at home rather repeated trips to the hospital. We discussed hospice as a tool that may be beneficial in this goal when she reaches a point where we are trying to fix problems that are not fixable.  SUMMARY OF RECOMMENDATIONS   - DNR/DNI - Most form completed - Plan for back to ALF on discharge.  I recommended follow up as OP by palliative care.  Please include this on the discharge summary if agree this is appropriate.  Code Status/Advance Care Planning:  DNR  Psycho-social/Spiritual:   Desire for further Chaplaincy support:no  Additional Recommendations: Education on Hospice  Prognosis:   Unable to determine  Discharge Planning: ALF with palliative to follow      Primary Diagnoses: Present on Admission: . Acute confusional state of infective origin . Mild dementia . Anemia . CKD (chronic kidney disease) stage 3, GFR 30-59 ml/min (HCC) . Essential hypertension . Difficulty walking . Spinal stenosis . Compression fracture  of lumbar spine, non-traumatic ([place])   I have reviewed the medical record, interviewed the patient and family, and examined the patient. The following aspects are pertinent.  Past Medical History:  Diagnosis Date  . Anemia   . CKD (chronic kidney disease) stage 3, GFR 30-59 ml/min (HCC)   . Dementia   . Hypertension   . Lumbar compression fracture ([place])   . Pelvis fracture ([place])   . Thoracic compression fracture [facility])    Social History   Socioeconomic History  . Marital status: Widowed    Spouse name: Not on file  . Number of children: Not on file  . Years of education: Not on file  . Highest education level: Not on file  Occupational History  . Not on file  Social Needs  . Financial resource strain: Not on file  . Food insecurity:    Worry: Not on file    Inability: Not on  file  . Transportation needs:    Medical: Not on file    Non-medical: Not on file  Tobacco Use  . Smoking status: Never Smoker  . Smokeless tobacco: Never Used  Substance and Sexual Activity  . Alcohol use: Not Currently    Comment: ocasional  . Drug use: No  . Sexual activity: Not on file  Lifestyle  . Physical activity:    Days per week: Not on file    Minutes per session: Not on file  . Stress: Not on file  Relationships  . Social connections:    Talks on phone: Not on file    Gets together: Not on file    Attends religious service: Not on file    Active member of club or organization: Not on file    Attends meetings of clubs or organizations: Not on file    Relationship status: Not on file  Other Topics Concern  . Not on file  Social History Narrative  . Not on file   No family history on file. Scheduled Meds: . aspirin  81 mg Oral Daily  . calcitonin (salmon)  1 spray Alternating Nares QHS  . enoxaparin (LOVENOX) injection  30 mg Subcutaneous Q24H  . estradiol  1 g Vaginal QHS  . hydrALAZINE  25 mg Oral TID WC  . metoprolol succinate  50 mg Oral Daily   Continuous Infusions: . sodium chloride 75 mL/hr at 04/04/18 1740  . cefTRIAXone (ROCEPHIN)  IV 1 g (04/04/18 0645)   PRN Meds:.acetaminophen **OR** acetaminophen, albuterol, bisacodyl, ipratropium, nystatin cream, ondansetron **OR** ondansetron (ZOFRAN) IV, polyethylene glycol Medications Prior to Admission:  Prior to Admission medications   Medication Sig Start Date End Date Taking? Authorizing Provider  albuterol (PROVENTIL) (2.5 MG/3ML) 0.083% nebulizer solution Take 2.5 mg by nebulization every 4 (four) hours as needed (for wheezing or coughing).    Yes [provider]  aspirin 81 MG chewable tablet Chew 81 mg by mouth daily.   Yes [provider]  bisacodyl (DULCOLAX) 5 MG EC tablet Take 2 tablets (10 mg total) by mouth daily as needed for moderate constipation. 09/24/17  Yes Regalado,  Belkys A, MD  calcitonin, salmon, (MIACALCIN/FORTICAL) 200 UNIT/ACT nasal spray Place 1 spray into alternate nostrils See admin instructions. Spray 1 spray into Right nostril every other day then alternating with left nostril   Yes [provider]  Calcium Carb-Cholecalciferol (CALCIUM 600+D3 PO) Take 1 tablet by mouth daily with breakfast.   Yes [provider]  Cyanocobalamin (VITAMIN B 12) 100 MCG LOZG Take  100 mcg by mouth daily.    Yes [provider]  estradiol (ESTRACE) 0.1 MG/GM vaginal cream Place 1 g vaginally daily.   Yes [provider]  hydrALAZINE (APRESOLINE) 25 MG tablet Take 1 tablet (25 mg total) by mouth every 8 (eight) hours. Patient taking differently: Take 25 mg by mouth 3 (three) times daily with meals.  09/24/17  Yes Regalado, Belkys A, MD  ipratropium (ATROVENT) 0.02 % nebulizer solution Take 0.5 mg by nebulization 2 (two) times daily as needed for wheezing or shortness of breath.   Yes [provider]  metoprolol succinate (TOPROL-XL) 50 MG 24 hr tablet Take 1 tablet (50 mg total) by mouth daily. Take with or immediately following a meal. 09/25/17  Yes Regalado, Belkys A, MD  nystatin cream (MYCOSTATIN) Apply 1 application topically See admin instructions. Apply to affected areas two times a day as needed for diaper rash or redness   Yes [provider]  olmesartan (BENICAR) 40 MG tablet Take 40 mg by mouth daily.   Yes [provider]  polyethylene glycol (MIRALAX) packet Take 17 g by mouth daily as needed for moderate constipation (over the counter). Patient taking differently: Take 17 g by mouth daily as needed for moderate constipation.  11/05/17  Yes Rai, Ripudeep K, MD  sulfamethoxazole-trimethoprim (BACTRIM,SEPTRA) 400-80 MG tablet Take 1 tablet by mouth every other day.   Yes [provider]  ipratropium-albuterol (DUONEB) 0.5-2.5 (3) MG/3ML SOLN Take 3 mLs by nebulization 3 (three) times  daily. Patient not taking: Reported on 04/03/2018 09/24/17   Regalado, Jerald Kief A, MD  polyethylene glycol (MIRALAX / GLYCOLAX) packet Take 17 g by mouth daily as needed. Patient not taking: Reported on 04/03/2018 09/24/17   Regalado, Jerald Kief A, MD  senna-docusate (SENOKOT-S) 8.6-50 MG tablet Take 1 tablet by mouth at bedtime. Patient not taking: Reported on 04/03/2018 11/05/17   Mendel Corning, MD   No Known Allergies Review of Systems  Denies complaints.  Confused but pleasant.  Physical Exam General: Alert, awake, in no acute distress. Pleasantly confused. Heart: Regular rate and rhythm. No murmur appreciated. Lungs: Fair air movement, clear Abdomen: Soft, nontender, nondistended, positive bowel sounds.  Ext: No significant edema Skin: Warm and dry Neuro: Grossly intact, nonfocal.   Vital Signs: BP (!) 164/78 (BP Location: Left Arm)   Pulse 70   Temp 97.8 F (36.6 C) (Oral)   Resp 18   Ht 5' (1.524 m)   Wt 54.3 kg (119 lb 11.4 oz)   SpO2 99%   BMI 23.38 kg/m  Pain Scale: 0-10   Pain Score: 0-No pain   SpO2: SpO2: 99 % O2 Device:SpO2: 99 % O2 Flow Rate: .   IO: Intake/output summary:   Intake/Output Summary (Last 24 hours) at 04/04/2018 1830 Last data filed at 04/04/2018 1200 Gross per 24 hour  Intake 1972.59 ml  Output -  Net 1972.59 ml    LBM: Last BM Date: 04/02/18 Baseline Weight: Weight: 57.2 kg (126 lb) Most recent weight: Weight: 54.3 kg (119 lb 11.4 oz)     Palliative Assessment/Data:   Flowsheet Rows     Most Recent Value  Intake Tab  Referral Department  Hospitalist  Unit at Time of Referral  Med/Surg Unit  Palliative Care Primary Diagnosis  Sepsis/Infectious Disease  Date Notified  04/03/18  Palliative Care Type  New Palliative care  Reason for referral  Clarify Goals of Care  Date of Admission  04/03/18  Date first seen by Palliative Care  04/04/18  # of days Palliative referral response time  1 Day(s)  # of days IP prior to Palliative referral   0  Clinical Assessment  Palliative Performance Scale Score  30%  Psychosocial & Spiritual Assessment  Palliative Care Outcomes  Patient/Family meeting held?  Yes  Who was at the meeting?  Son, daughter  Palliative Care Outcomes  Clarified goals of care      Time Total: 80 minutes Greater than 50%  of this time was spent counseling and coordinating care related to the above assessment and plan.  Signed by: Micheline Rough, MD   Please contact Palliative Medicine Team phone at [phone] for questions and concerns.  For individual provider: See Shea Evans

## 2018-04-04 NOTE — Evaluation (Signed)
Physical Therapy Evaluation Patient Details Name: Gaylord ShihRachel Azzara MRN: [id number] DOB: 1921/10/27 Today's Date: 04/04/2018   History of Present Illness  84 y.o. female with medical history significant for mild dementia, hypertension, osteoporosis, history of thoracic and lumbar compression fractures as well as history of pelvic fracture, normocytic anemia, and stage III chronic kidney disease. Pt admitted for possible UTI, AMS  Clinical Impression    Pt admitted with above diagnosis. Pt currently with functional limitations due to the deficits listed below (see PT Problem List). Pt is supervision with gait at  baseline at ALF, requiring min assist to min guard today for gait, partially d/t assist needed to maneuver RW ( pt normally amb with rollator); Feel pt will be able to return to ALF, family in agreement; will follow in acute setting;  Pt will benefit from skilled PT to increase their independence and safety with mobility to allow discharge to the venue listed below.     Follow Up Recommendations No PT follow up    Equipment Recommendations  None recommended by PT    Recommendations for Other Services       Precautions / Restrictions Precautions Precautions: Fall Restrictions Weight Bearing Restrictions: No      Mobility  Bed Mobility               General bed mobility comments: in recliner  Transfers Overall transfer level: Needs assistance Equipment used: Rolling walker (2 wheeled) Transfers: Sit to/from Stand Sit to Stand: Min guard         General transfer comment: cues for hand placement; min/guard to steady on rising from recliner and (low) toilet   Ambulation/Gait Ambulation/Gait assistance: Min guard;Min assist Gait Distance (Feet): 95 Feet Assistive device: Rolling walker (2 wheeled) Gait Pattern/deviations: Step-through pattern;Decreased stride length;Trunk flexed     General Gait Details: cues for RW position and distance from self; assist to  maneuver (amb with rollator at baseline); unsteady but improved with distance, no overt LOB  Stairs            Wheelchair Mobility    Modified Rankin (Stroke Patients Only)       Balance Overall balance assessment: Needs assistance Sitting-balance support: No upper extremity supported;Feet supported Sitting balance-Leahy Scale: Good       Standing balance-Leahy Scale: Fair Standing balance comment: able to stand and pull up depends with min/guard for balance             High level balance activites: Side stepping;Head turns               Pertinent Vitals/Pain Pain Assessment: No/denies pain    Home Living Family/patient expects to be discharged to:: Assisted living([facility])               Home Equipment: Dan HumphreysWalker - 4 wheels      Prior Function Level of Independence: Needs assistance   Gait / Transfers Assistance Needed: ambulates supervision with rollator. Supervision needed due to confusion.  ADL's / Homemaking Assistance Needed: ALF staff assists with ADLs        Hand Dominance        Extremity/Trunk Assessment   Upper Extremity Assessment Upper Extremity Assessment: Defer to OT evaluation    Lower Extremity Assessment Lower Extremity Assessment: Overall WFL for tasks assessed       Communication   Communication: No difficulties  Cognition Arousal/Alertness: Awake/alert Behavior During Therapy: WFL for tasks assessed/performed Overall Cognitive Status: History of cognitive impairments - at baseline  General Comments: gettign back to her normal cognition per pt family      General Comments      Exercises     Assessment/Plan    PT Assessment Patient needs continued PT services  PT Problem List Decreased activity tolerance;Decreased balance;Decreased mobility;Decreased safety awareness       PT Treatment Interventions DME instruction;Gait training;Therapeutic  activities;Functional mobility training;Therapeutic exercise;Patient/family education    PT Goals (Current goals can be found in the Care Plan section)  Acute Rehab PT Goals Patient Stated Goal: none stated per pt--family goal is back to AFL asap PT Goal Formulation: With patient Time For Goal Achievement: 04/18/18 Potential to Achieve Goals: Good    Frequency Min 3X/week   Barriers to discharge        Co-evaluation               AM-PAC PT "6 Clicks" Daily Activity  Outcome Measure Difficulty turning over in bed (including adjusting bedclothes, sheets and blankets)?: A Little Difficulty moving from lying on back to sitting on the side of the bed? : A Little Difficulty sitting down on and standing up from a chair with arms (e.g., wheelchair, bedside commode, etc,.)?: Unable Help needed moving to and from a bed to chair (including a wheelchair)?: A Little Help needed walking in hospital room?: A Little Help needed climbing 3-5 steps with a railing? : A Little 6 Click Score: 16    End of Session Equipment Utilized During Treatment: Gait belt Activity Tolerance: Patient tolerated treatment well Patient left: in chair;with call bell/phone within reach;with family/visitor present;with chair alarm set        Time: 1615-1630 PT Time Calculation (min) (ACUTE ONLY): 15 min   Charges:   PT Evaluation $PT Eval Low Complexity: 1 Low          Drucilla Chalet, PT  04/04/2018   [facility] 04/04/2018, 4:39 PM

## 2018-04-04 NOTE — Progress Notes (Addendum)
PROGRESS NOTE    Katherine Kirby  ZOX:[id number] DOB: [birth year] DOA: 04/03/2018 PCP: Lewis Moccasin, MD  Outpatient Specialists:     Brief Narrative:  84 yo female with a PMH significant for HTN, chronic anemia, dementia, CKD stage 3-4 is here for altered mentation. At her assisted living facility, the patient is woken up q2hrs to urinate. At 4AM, the patient was slow to wake. Receives Bactrim every other day at her facility for UTI prophylaxis. Similar admission 3/19 her urine grew enterobacter UTI resistant to Cefazolin, sensitive to Rocephin. ED course included Troponin <0.03, CXR which showed chronic bronchitis and CT head wo contrast which showed no acute intracranial abnormality. The patient was admitted for overnight monitoring of mentation and IV abx management. Pertinent labs include: CBC - RBC 3.03 Hgb 9.2 Hct 29.1, BMP - Chloride 114 CO2 20 Ca 8.8 Albumin 3.4 Total protein 5.9 GFR 52 Cr 0.92 (yest 1.27), Ammonia 0 (wnl). UA - nitrites positive leukocytes small bacteria many, Urine Culture sensitivity pending.  Assessment & Plan:   Principal Problem:   Acute confusional state of infective origin Active Problems:   Compression fracture of lumbar spine, non-traumatic (HCC)   Spinal stenosis   Difficulty walking   Essential hypertension   Urinary tract infection   Anemia   CKD (chronic kidney disease) stage 3, GFR 30-59 ml/min (HCC)   Mild dementia  Metabolic encephalopathy 2/2 to Urinary Tract Infection: -UA - nitrites positive leukocytes small bacteria many -The patient's son endorses she is close to baseline of mentation -Currently treating UTI with Rocephin -Awaiting results of sensitivity in order to ensure effective management  -Oxygen saturation is 96% on RA  Essential Hypertension: -Continue metoprolol and hydralazine  Multifactorial Anemia: -CBC - RBC 3.03 Hgb 9.2 Hct 29.1 -Chronically anemic -Asymptomatic at this time  CKD stage 3: -Cr improved today  0.92, yesterday 1.27 -Likely from dehydration, repleted  Mild Dementia: -Palliative Care consulted -DNR -Mild/Moderate, is still able to live independently in assisted living facility  DVT prophylaxis: Lovenox Code Status: DNR Family Communication: The patient's son and daughter-in-law were present at bedside during interview. The plan was discussed and any questions were answered. Disposition Plan: Pending results of urine culture sensitivity, the patient will be discharged back to her assisted living facility with PO antibiotics as well as possible different prophylactic abx for UTI. PT evaluation ordered.   Consultants:   None  Procedures:   None  Antimicrobials:   Rocephin   Subjective: The patient was lying supine alert and oriented in no acute distress. Patient's son endorses she is back to her baseline, mentally.  Objective: Vitals:   04/03/18 2300 04/04/18 0304 04/04/18 0653 04/04/18 1139  BP: (!) 144/78 (!) 164/137  (!) 164/78  Pulse: 70 73  70  Resp:  18  18  Temp: 97.8 F (36.6 C) 98.8 F (37.1 C)  97.8 F (36.6 C)  TempSrc: Oral Oral  Oral  SpO2: 95% 96%  99%  Weight:   54.3 kg (119 lb 11.4 oz)   Height:        Intake/Output Summary (Last 24 hours) at 04/04/2018 1505 Last data filed at 04/04/2018 1200 Gross per 24 hour  Intake 1972.59 ml  Output -  Net 1972.59 ml   Filed Weights   04/03/18 0442 04/04/18 0653  Weight: 57.2 kg (126 lb) 54.3 kg (119 lb 11.4 oz)    Examination:  General exam: Appears calm and comfortable  Respiratory system: Clear to auscultation. Respiratory effort normal. Cardiovascular system:  S1 & S2 heard, RRR. No JVD, murmurs, rubs, gallops or clicks. No pedal edema. Gastrointestinal system: Abdomen is nondistended, soft and nontender. No organomegaly or masses felt. Normal bowel sounds heard. Central nervous system: Alert and oriented. No focal neurological deficits. Extremities: Symmetric 5 x 5 power. Skin: No rashes,  lesions or ulcers Psychiatry: Judgement and insight appear normal. Mood & affect appropriate.     Data Reviewed: I have personally reviewed following labs and imaging studies  CBC: Recent Labs  Lab 04/03/18 0439 04/04/18 0506  WBC 5.1 4.0  NEUTROABS 3.4  --   HGB 9.8* 9.2*  HCT 30.8* 29.1*  MCV 96.3 96.0  PLT 177 158   Basic Metabolic Panel: Recent Labs  Lab 04/03/18 0439 04/04/18 0506  NA 136 141  K 4.8 4.4  CL 107 114*  CO2 21* 20*  GLUCOSE 95 98  BUN 35* 20  CREATININE 1.27* 0.92  CALCIUM 9.4 8.8*   GFR: Estimated Creatinine Clearance: 26.9 mL/min (by C-G formula based on SCr of 0.92 mg/dL). Liver Function Tests: Recent Labs  Lab 04/03/18 0439  AST 16  ALT 12  ALKPHOS 51  BILITOT 0.5  PROT 5.9*  ALBUMIN 3.4*   No results for input(s): LIPASE, AMYLASE in the last 168 hours. Recent Labs  Lab 04/03/18 0439  AMMONIA 10   Coagulation Profile: No results for input(s): INR, PROTIME in the last 168 hours. Cardiac Enzymes: Recent Labs  Lab 04/03/18 0439  TROPONINI <0.03   BNP (last 3 results) No results for input(s): PROBNP in the last 8760 hours. HbA1C: No results for input(s): HGBA1C in the last 72 hours. CBG: Recent Labs  Lab 04/03/18 0425  GLUCAP 95   Lipid Profile: No results for input(s): CHOL, HDL, LDLCALC, TRIG, CHOLHDL, LDLDIRECT in the last 72 hours. Thyroid Function Tests: No results for input(s): TSH, T4TOTAL, FREET4, T3FREE, THYROIDAB in the last 72 hours. Anemia Panel: No results for input(s): VITAMINB12, FOLATE, FERRITIN, TIBC, IRON, RETICCTPCT in the last 72 hours. Urine analysis:    Component Value Date/Time   COLORURINE YELLOW 04/03/2018 0429   APPEARANCEUR CLEAR 04/03/2018 0429   LABSPEC 1.011 04/03/2018 0429   PHURINE 5.0 04/03/2018 0429   GLUCOSEU NEGATIVE 04/03/2018 0429   HGBUR NEGATIVE 04/03/2018 0429   BILIRUBINUR NEGATIVE 04/03/2018 0429   KETONESUR NEGATIVE 04/03/2018 0429   PROTEINUR NEGATIVE 04/03/2018 0429     NITRITE POSITIVE (A) 04/03/2018 0429   LEUKOCYTESUR SMALL (A) 04/03/2018 0429   Sepsis Labs: @LABRCNTIP (procalcitonin:4,lacticidven:4)  ) Recent Results (from the past 240 hour(s))  Urine Culture     Status: Abnormal (Preliminary result)   Collection Time: 04/03/18  4:34 AM  Result Value Ref Range Status   Specimen Description URINE, CATHETERIZED  Final   Special Requests NONE  Final   Culture (A)  Final    >=100,000 COLONIES/mL KLEBSIELLA PNEUMONIAE SUSCEPTIBILITIES TO FOLLOW Performed at [facility], [address] [place], [place] [address]    Report Status PENDING  Incomplete         Radiology Studies: Ct Head Wo Contrast  Result Date: 04/03/2018 CLINICAL DATA:  Altered level of consciousness EXAM: CT HEAD WITHOUT CONTRAST TECHNIQUE: Contiguous axial images were obtained from the base of the skull through the vertex without intravenous contrast. COMPARISON:  10/04/2017 FINDINGS: Brain: There is atrophy and chronic small vessel disease changes. No acute intracranial abnormality. Specifically, no hemorrhage, hydrocephalus, mass lesion, acute infarction, or significant intracranial injury. Vascular: No hyperdense vessel or unexpected calcification. Skull: No acute  calvarial abnormality. Sinuses/Orbits: Visualized paranasal sinuses and mastoids clear. Orbital soft tissues unremarkable. Other: None IMPRESSION: No acute intracranial abnormality. Atrophy, chronic microvascular disease. Electronically Signed   By: Charlett Nose M.D.   On: 04/03/2018 07:14   Dg Chest Portable 1 View  Result Date: 04/03/2018 CLINICAL DATA:  84 year old with altered mental status. EXAM: PORTABLE CHEST 1 VIEW COMPARISON:  Radiograph 11/03/2017, chest CT 09/17/2017 FINDINGS: Chronic elevation of right hemidiaphragm. Unchanged heart size and mediastinal contours with aortic atherosclerosis. Chronic bronchitic change without focal consolidation. No pleural effusion or pneumothorax. Multiple overlying  monitoring leads. IMPRESSION: Chronic bronchitic change without acute abnormality. Aortic Atherosclerosis (ICD10-I70.0). Electronically Signed   By: Rubye Oaks M.D.   On: 04/03/2018 05:04        Scheduled Meds: . aspirin  81 mg Oral Daily  . calcitonin (salmon)  1 spray Alternating Nares QHS  . enoxaparin (LOVENOX) injection  30 mg Subcutaneous Q24H  . estradiol  1 g Vaginal QHS  . hydrALAZINE  25 mg Oral TID WC  . metoprolol succinate  50 mg Oral Daily   Continuous Infusions: . sodium chloride 75 mL/hr at 04/04/18 0121  . cefTRIAXone (ROCEPHIN)  IV 1 g (04/04/18 0645)     LOS: 1 day    Nedra Hai, PA-S Delrae Sawyers Arrien MD [facility] Pager [phone]  If 7PM-7AM, please contact night-coverage [email or web address] Password [email or web address] 04/04/2018, 3:05 PM

## 2018-04-04 NOTE — Plan of Care (Signed)
  Problem: Pain Managment: Goal: General experience of comfort will improve Outcome: Progressing   

## 2018-04-05 DIAGNOSIS — G9340 Encephalopathy, unspecified: Secondary | ICD-10-CM

## 2018-04-05 DIAGNOSIS — N179 Acute kidney failure, unspecified: Secondary | ICD-10-CM

## 2018-04-05 LAB — CBC WITH DIFFERENTIAL/PLATELET
ABS IMMATURE GRANULOCYTES: 0 10*3/uL (ref 0.0–0.1)
BASOS PCT: 1 %
Basophils Absolute: 0 10*3/uL (ref 0.0–0.1)
EOS ABS: 0.2 10*3/uL (ref 0.0–0.7)
Eosinophils Relative: 3 %
HEMATOCRIT: 28.2 % — AB (ref 36.0–46.0)
Hemoglobin: 8.9 g/dL — ABNORMAL LOW (ref 12.0–15.0)
Immature Granulocytes: 1 %
Lymphocytes Relative: 12 %
Lymphs Abs: 0.5 10*3/uL — ABNORMAL LOW (ref 0.7–4.0)
MCH: 30.2 pg (ref 26.0–34.0)
MCHC: 31.6 g/dL (ref 30.0–36.0)
MCV: 95.6 fL (ref 78.0–100.0)
MONO ABS: 0.6 10*3/uL (ref 0.1–1.0)
MONOS PCT: 13 %
Neutro Abs: 3.1 10*3/uL (ref 1.7–7.7)
Neutrophils Relative %: 70 %
Platelets: 165 10*3/uL (ref 150–400)
RBC: 2.95 MIL/uL — ABNORMAL LOW (ref 3.87–5.11)
RDW: 13 % (ref 11.5–15.5)
WBC: 4.4 10*3/uL (ref 4.0–10.5)

## 2018-04-05 LAB — URINE CULTURE

## 2018-04-05 LAB — BASIC METABOLIC PANEL
Anion gap: 7 (ref 5–15)
BUN: 17 mg/dL (ref 8–23)
CHLORIDE: 112 mmol/L — AB (ref 98–111)
CO2: 19 mmol/L — AB (ref 22–32)
Calcium: 8.3 mg/dL — ABNORMAL LOW (ref 8.9–10.3)
Creatinine, Ser: 0.94 mg/dL (ref 0.44–1.00)
GFR calc Af Amer: 58 mL/min — ABNORMAL LOW (ref >60)
GFR calc non Af Amer: 50 mL/min — ABNORMAL LOW (ref >60)
GLUCOSE: 99 mg/dL (ref 70–99)
POTASSIUM: 4 mmol/L (ref 3.5–5.1)
Sodium: 138 mmol/L (ref 135–145)

## 2018-04-05 MED ORDER — CEPHALEXIN 500 MG PO CAPS
500.0000 mg | ORAL_CAPSULE | Freq: Two times a day (BID) | ORAL | Status: DC
  Administered 2018-04-05: 500 mg via ORAL
  Filled 2018-04-05: qty 1

## 2018-04-05 MED ORDER — CEPHALEXIN 500 MG PO CAPS: 500.0000 mg | ORAL_CAPSULE | Freq: Two times a day (BID) | ORAL | 0 refills | Status: AC

## 2018-04-05 NOTE — [place] FL2 (Signed)
[place] MEDICAID FL2 LEVEL OF CARE SCREENING TOOL     IDENTIFICATION  Patient Name: Katherine Kirby Birthdate: 1922-01-21 Sex: female Admission Date (Current Location): 04/03/2018  [facility] and [place] Number:  Producer, television/film/video and Address:  [facility] [place], [address], [place], [place] [address]      Provider Number: [id number]  Attending Physician Name and Address:  Coralie Keens,*  Relative Name and Phone Number:  Jillyn Hidden Winer,[phone]    Current Level of Care: Other (Comment)(back to alf) Recommended Level of Care: Other (Comment)(ALF) Prior Approval Number:    Date Approved/Denied:   PASRR Number:    Discharge Plan: Other (Comment)(ALF)    Current Diagnoses: Patient Active Problem List   Diagnosis Date Noted  . Acute encephalopathy 04/03/2018  . Acute metabolic encephalopathy 11/03/2017  . Urinary tract infection 11/03/2017  . Constipation 11/03/2017  . Anemia 11/03/2017  . CKD (chronic kidney disease) stage 3, GFR 30-59 ml/min (HCC) 11/03/2017  . Thoracic compression fracture (HCC) 11/03/2017  . High blood pressure 11/03/2017  . Mild dementia 11/03/2017  . Abnormal urinalysis 09/17/2017  . Respiratory insufficiency 09/17/2017  . HTN (hypertension) 09/17/2017  . Acute kidney injury (HCC) 09/17/2017  . Normocytic anemia 09/17/2017  . HCAP (healthcare-associated pneumonia) 09/17/2017  . Compression fracture of lumbar spine, non-traumatic (HCC) 10/18/2015  . Lumbar compression fracture (HCC) 10/18/2015  . Spinal stenosis 10/18/2015  . Difficulty walking 10/18/2015  . Acute confusional state of infective origin 10/18/2015  . AKI (acute kidney injury) (HCC) 10/18/2015  . Hypertension 10/18/2015  . Hip pain   . Essential hypertension     Orientation RESPIRATION BLADDER Height & Weight     Self  Normal Incontinent Weight: 119 lb 11.4 oz (54.3 kg) Height:  5' (152.4 cm)  BEHAVIORAL SYMPTOMS/MOOD NEUROLOGICAL BOWEL  NUTRITION STATUS      Continent Diet(regular)  AMBULATORY STATUS COMMUNICATION OF NEEDS Skin   Limited Assist Verbally                         Personal Care Assistance Level of Assistance  Bathing, Feeding, Dressing Bathing Assistance: Limited assistance Feeding assistance: Independent Dressing Assistance: Limited assistance     Functional Limitations Info  Sight, Hearing, Speech Sight Info: Adequate Hearing Info: Adequate Speech Info: Adequate    SPECIAL CARE FACTORS FREQUENCY  PT (By licensed PT)     PT Frequency: 3x wk              Contractures      Additional Factors Info  Code Status, Allergies Code Status Info: DNR Allergies Info: No [place] Allergies           Current Medications (04/05/2018):  This is the current hospital active medication list Current Facility-Administered Medications  Medication Dose Route Frequency Provider Last Rate Last Dose  . 0.9 %  sodium chloride infusion   Intravenous Continuous Marcos Eke, PA-C 75 mL/hr at 04/04/18 2300    . acetaminophen (TYLENOL) tablet 650 mg  650 mg Oral Q6H PRN Marcos Eke, PA-C       Or  . acetaminophen (TYLENOL) suppository 650 mg  650 mg Rectal Q6H PRN Marcos Eke, PA-C      . albuterol (PROVENTIL) (2.5 MG/3ML) 0.083% nebulizer solution 2.5 mg  2.5 mg Nebulization Q4H PRN Marcos Eke, PA-C      . aspirin chewable tablet 81 mg  81 mg Oral Daily Marcos Eke, PA-C  81 mg at 04/05/18 1016  . bisacodyl (DULCOLAX) EC tablet 10 mg  10 mg Oral Daily PRN Marcos EkeWertman, Sara E, PA-C      . calcitonin (salmon) (MIACALCIN/FORTICAL) nasal spray 1 spray  1 spray Alternating Nares QHS Marcos EkeWertman, Sara E, PA-C   1 spray at 04/04/18 2109  . [START ON 04/06/2018] cephALEXin (KEFLEX) capsule 500 mg  500 mg Oral Q12H Arrien, York RamMauricio Daniel, MD   500 mg at 04/05/18 1016  . enoxaparin (LOVENOX) injection 30 mg  30 mg Subcutaneous Q24H Jonah BlueYates, Jennifer, MD   30 mg at 04/05/18 1016  . estradiol (ESTRACE) vaginal  cream 1 g  1 g Vaginal QHS Marcos EkeWertman, Sara E, PA-C   1 g at 04/04/18 2123  . hydrALAZINE (APRESOLINE) tablet 25 mg  25 mg Oral TID WC Wertman, Sara E, PA-C   25 mg at 04/05/18 1016  . ipratropium (ATROVENT) nebulizer solution 0.5 mg  0.5 mg Nebulization BID PRN Marcos EkeWertman, Sara E, PA-C      . metoprolol succinate (TOPROL-XL) 24 hr tablet 50 mg  50 mg Oral Daily Marcos EkeWertman, Sara E, PA-C   50 mg at 04/05/18 1016  . nystatin cream (MYCOSTATIN) 1 application  1 application Topical Daily PRN Marcos EkeWertman, Sara E, PA-C      . ondansetron (ZOFRAN) tablet 4 mg  4 mg Oral Q6H PRN Marcos EkeWertman, Sara E, PA-C       Or  . ondansetron (ZOFRAN) injection 4 mg  4 mg Intravenous Q6H PRN Gwynneth MunsonWertman, Sara E, PA-C      . polyethylene glycol (MIRALAX / GLYCOLAX) packet 17 g  17 g Oral Daily PRN Marcos EkeWertman, Sara E, PA-C         Discharge Medications: Please see discharge summary for a list of discharge medications.  Relevant Imaging Results:  Relevant Lab Results:   Additional Information SSN: [id number]  Althea CharonAshley C Keegen [place], [place]

## 2018-04-05 NOTE — Discharge Summary (Signed)
Physician Discharge Summary  Katherine Kirby ZOX:[id number] DOB: 1921-10-10 DOA: 04/03/2018  PCP: Lewis Moccasin, MD  Admit date: 04/03/2018 Discharge date: 04/05/2018  Admitted From: Home (assited living) Disposition:  Home (assisted living)  Recommendations for Outpatient Follow-up and new medication changes:  1. Follow up with Dr. Duanne Guess in 7 days 2. Outpatient palliative care follow up 3. Patient placed on cefazolin for 5 more days.   Home Health: no   Equipment/Devices: no    Discharge Condition: stable  CODE STATUS: DNR Diet recommendation: Heart healthy    Brief/Interim Summary: 84 year old female who presented with altered mental status.  She does have the significant past medical history for dementia, hypertension and stage III chronic kidney disease.  Patient had altered mentation, noted to be confused and disoriented, decreased responsiveness.  Her initial physical examination blood pressure 168/73, heart rate 69, temperature 97.5, respiratory rate 18, oxygen saturation 99%.  Patient was encephalopathic, dry mucous membranes, lungs clear to auscultation bilaterally, heart S1-S2 present rhythmic, abdomen soft nontender, no lower extremity edema.  Sodium 136, potassium 4.8, chloride 107, bicarb 21, glucose 95, BUN 35, creatinine 1.27, white count 5.1, hemoglobin 9.8, hematocrit 30.8, platelets 177.  Urinalysis at 25-50 white cells, specific gravity 1.011.  Head CT was negative for acute abnormalities.  Chest radiograph was negative for infiltrates.  EKG normal axis, normal intervals, sinus rhythm.  Patient was admitted to the hospital working diagnosis of metabolic encephalopathy due to urinary tract infection.  1.  Metabolic encephalopathy due to Klebsiella pneumoniae urinary tract infection, present on admission.  Patient was admitted to the medical ward, she was placed on a remote telemetry monitor, received IV antibiotic therapy with ceftriaxone.  Her urine culture came back  positive for Klebsiella pneumonia, sensitive to cephalosporins and fluoroquinolones, resistant to ampicillin and trimethoprim sulfamethoxazole.  Patient will be discharged on cephalexin for the next 5 days.  2.  Pre-renal acute kidney injury on chronic kidney disease stage III.  Patient received IV fluids with improvement of kidney function, discharge creatinine 0.94, potassium 4.0, serum bicarbonate 19.  3.  COPD.  No signs of acute exacerbation. Continue bronchodilator with albuterol and ipratropium.    4.  Hypertension.  Blood pressure control with hydralazine and metoprolol. Will resume olmesartan at discharge.   5.  Dementia.  No agitation or confusion, physical therapy evaluation with no further outpatient PT follow-up.  Discharge Diagnoses:  Principal Problem:   Acute confusional state of infective origin Active Problems:   Compression fracture of lumbar spine, non-traumatic (HCC)   Spinal stenosis   Difficulty walking   Essential hypertension   Urinary tract infection   Anemia   CKD (chronic kidney disease) stage 3, GFR 30-59 ml/min (HCC)   Mild dementia    Discharge Instructions   Allergies as of 04/05/2018   No Known Allergies     Medication List    STOP taking these medications   ipratropium-albuterol 0.5-2.5 (3) MG/3ML Soln Commonly known as:  DUONEB   senna-docusate 8.6-50 MG tablet Commonly known as:  Senokot-S   sulfamethoxazole-trimethoprim 400-80 MG tablet Commonly known as:  BACTRIM,SEPTRA     TAKE these medications   albuterol (2.5 MG/3ML) 0.083% nebulizer solution Commonly known as:  PROVENTIL Take 2.5 mg by nebulization every 4 (four) hours as needed (for wheezing or coughing).   aspirin 81 MG chewable tablet Chew 81 mg by mouth daily.   bisacodyl 5 MG EC tablet Commonly known as:  DULCOLAX Take 2 tablets (10 mg total) by mouth daily  as needed for moderate constipation.   calcitonin (salmon) 200 UNIT/ACT nasal spray Commonly known as:   MIACALCIN/FORTICAL Place 1 spray into alternate nostrils See admin instructions. Spray 1 spray into Right nostril every other day then alternating with left nostril   CALCIUM 600+D3 PO Take 1 tablet by mouth daily with breakfast.   cephALEXin 500 MG capsule Commonly known as:  KEFLEX Take 1 capsule (500 mg total) by mouth every 12 (twelve) hours for 5 days. Start taking on:  04/06/2018   estradiol 0.1 MG/GM vaginal cream Commonly known as:  ESTRACE Place 1 g vaginally daily.   hydrALAZINE 25 MG tablet Commonly known as:  APRESOLINE Take 1 tablet (25 mg total) by mouth every 8 (eight) hours. What changed:  when to take this   ipratropium 0.02 % nebulizer solution Commonly known as:  ATROVENT Take 0.5 mg by nebulization 2 (two) times daily as needed for wheezing or shortness of breath.   metoprolol succinate 50 MG 24 hr tablet Commonly known as:  TOPROL-XL Take 1 tablet (50 mg total) by mouth daily. Take with or immediately following a meal.   nystatin cream Commonly known as:  MYCOSTATIN Apply 1 application topically See admin instructions. Apply to affected areas two times a day as needed for diaper rash or redness   olmesartan 40 MG tablet Commonly known as:  BENICAR Take 40 mg by mouth daily.   polyethylene glycol packet Commonly known as:  MIRALAX Take 17 g by mouth daily as needed for moderate constipation (over the counter). What changed:    reasons to take this  Another medication with the same name was removed. Continue taking this medication, and follow the directions you see here.   Vitamin B 12 100 MCG Lozg Take 100 mcg by mouth daily.       No Known Allergies  Consultations:     Procedures/Studies: Ct Head Wo Contrast  Result Date: 04/03/2018 CLINICAL DATA:  Altered level of consciousness EXAM: CT HEAD WITHOUT CONTRAST TECHNIQUE: Contiguous axial images were obtained from the base of the skull through the vertex without intravenous contrast.  COMPARISON:  10/04/2017 FINDINGS: Brain: There is atrophy and chronic small vessel disease changes. No acute intracranial abnormality. Specifically, no hemorrhage, hydrocephalus, mass lesion, acute infarction, or significant intracranial injury. Vascular: No hyperdense vessel or unexpected calcification. Skull: No acute calvarial abnormality. Sinuses/Orbits: Visualized paranasal sinuses and mastoids clear. Orbital soft tissues unremarkable. Other: None IMPRESSION: No acute intracranial abnormality. Atrophy, chronic microvascular disease. Electronically Signed   By: Charlett Nose M.D.   On: 04/03/2018 07:14   Dg Chest Portable 1 View  Result Date: 04/03/2018 CLINICAL DATA:  84 year old with altered mental status. EXAM: PORTABLE CHEST 1 VIEW COMPARISON:  Radiograph 11/03/2017, chest CT 09/17/2017 FINDINGS: Chronic elevation of right hemidiaphragm. Unchanged heart size and mediastinal contours with aortic atherosclerosis. Chronic bronchitic change without focal consolidation. No pleural effusion or pneumothorax. Multiple overlying monitoring leads. IMPRESSION: Chronic bronchitic change without acute abnormality. Aortic Atherosclerosis (ICD10-I70.0). Electronically Signed   By: Rubye Oaks M.D.   On: 04/03/2018 05:04       Subjective: Patient is feeling better, no nausea or vomiting, tolerating po well, no fevr of chills, mentation back to her baseline.   Discharge Exam: Vitals:   04/05/18 0358 04/05/18 0728  BP: (!) 154/76 (!) 172/97  Pulse: 76 76  Resp: 17 17  Temp: 98.7 F (37.1 C) 98.6 F (37 C)  SpO2: 93% 97%   Vitals:   04/04/18 1922 04/04/18 2347  04/05/18 0358 04/05/18 0728  BP: (!) 157/86 (!) 160/82 (!) 154/76 (!) 172/97  Pulse: 78 73 76 76  Resp: 19 18 17 17   Temp: 98.8 F (37.1 C) 99 F (37.2 C) 98.7 F (37.1 C) 98.6 F (37 C)  TempSrc: Oral Oral Oral Oral  SpO2: 97% 100% 93% 97%  Weight:      Height:        General: Not in pain or dyspnea Neurology: Awake and  alert, non focal  E ENT: mild pallor, no icterus, oral mucosa moist Cardiovascular: No JVD. S1-S2 present, rhythmic, no gallops, rubs, or murmurs. No lower extremity edema. Pulmonary: positive breath sounds bilaterally, adequate air movement, no wheezing, rhonchi or rales. Gastrointestinal. Abdomen with no organomegaly, non tender, no rebound or guarding Skin. No rashes Musculoskeletal: no joint deformities   The results of significant diagnostics from this hospitalization (including imaging, microbiology, ancillary and laboratory) are listed below for reference.     Microbiology: Recent Results (from the past 240 hour(s))  Urine Culture     Status: Abnormal   Collection Time: 04/03/18  4:34 AM  Result Value Ref Range Status   Specimen Description URINE, CATHETERIZED  Final   Special Requests   Final    NONE Performed at [facility], [address] [place], [place] [address]    Culture >=100,000 COLONIES/mL KLEBSIELLA PNEUMONIAE (A)  Final   Report Status 04/05/2018 FINAL  Final   Organism ID, Bacteria KLEBSIELLA PNEUMONIAE (A)  Final      Susceptibility   Klebsiella pneumoniae - MIC*    AMPICILLIN >=32 RESISTANT Resistant     CEFAZOLIN <=4 SENSITIVE Sensitive     CEFTRIAXONE <=1 SENSITIVE Sensitive     CIPROFLOXACIN <=0.25 SENSITIVE Sensitive     GENTAMICIN <=1 SENSITIVE Sensitive     IMIPENEM <=0.25 SENSITIVE Sensitive     NITROFURANTOIN 32 SENSITIVE Sensitive     TRIMETH/SULFA >=320 RESISTANT Resistant     AMPICILLIN/SULBACTAM 8 SENSITIVE Sensitive     PIP/TAZO <=4 SENSITIVE Sensitive     Extended ESBL NEGATIVE Sensitive     * >=100,000 COLONIES/mL KLEBSIELLA PNEUMONIAE     Labs: BNP (last 3 results) Recent Labs    09/17/17 1619  BNP 1,223.5*   Basic Metabolic Panel: Recent Labs  Lab 04/03/18 0439 04/04/18 0506 04/05/18 0552  NA 136 141 138  K 4.8 4.4 4.0  CL 107 114* 112*  CO2 21* 20* 19*  GLUCOSE 95 98 99  BUN 35* 20 17  CREATININE 1.27* 0.92  0.94  CALCIUM 9.4 8.8* 8.3*   Liver Function Tests: Recent Labs  Lab 04/03/18 0439  AST 16  ALT 12  ALKPHOS 51  BILITOT 0.5  PROT 5.9*  ALBUMIN 3.4*   No results for input(s): LIPASE, AMYLASE in the last 168 hours. Recent Labs  Lab 04/03/18 0439  AMMONIA 10   CBC: Recent Labs  Lab 04/03/18 0439 04/04/18 0506 04/05/18 0552  WBC 5.1 4.0 4.4  NEUTROABS 3.4  --  3.1  HGB 9.8* 9.2* 8.9*  HCT 30.8* 29.1* 28.2*  MCV 96.3 96.0 95.6  PLT 177 158 165   Cardiac Enzymes: Recent Labs  Lab 04/03/18 0439  TROPONINI <0.03   BNP: Invalid input(s): POCBNP CBG: Recent Labs  Lab 04/03/18 0425  GLUCAP 95   D-Dimer No results for input(s): DDIMER in the last 72 hours. Hgb A1c No results for input(s): HGBA1C in the last 72 hours. Lipid Profile No results for input(s): CHOL, HDL, LDLCALC, TRIG,  CHOLHDL, LDLDIRECT in the last 72 hours. Thyroid function studies No results for input(s): TSH, T4TOTAL, T3FREE, THYROIDAB in the last 72 hours.  Invalid input(s): FREET3 Anemia work up No results for input(s): VITAMINB12, FOLATE, FERRITIN, TIBC, IRON, RETICCTPCT in the last 72 hours. Urinalysis    Component Value Date/Time   COLORURINE YELLOW 04/03/2018 0429   APPEARANCEUR CLEAR 04/03/2018 0429   LABSPEC 1.011 04/03/2018 0429   PHURINE 5.0 04/03/2018 0429   GLUCOSEU NEGATIVE 04/03/2018 0429   HGBUR NEGATIVE 04/03/2018 0429   BILIRUBINUR NEGATIVE 04/03/2018 0429   KETONESUR NEGATIVE 04/03/2018 0429   PROTEINUR NEGATIVE 04/03/2018 0429   NITRITE POSITIVE (A) 04/03/2018 0429   LEUKOCYTESUR SMALL (A) 04/03/2018 0429   Sepsis Labs Invalid input(s): PROCALCITONIN,  WBC,  LACTICIDVEN Microbiology Recent Results (from the past 240 hour(s))  Urine Culture     Status: Abnormal   Collection Time: 04/03/18  4:34 AM  Result Value Ref Range Status   Specimen Description URINE, CATHETERIZED  Final   Special Requests   Final    NONE Performed at [facility], [address] [place], [place] [id number]    Culture >=100,000 COLONIES/mL KLEBSIELLA PNEUMONIAE (A)  Final   Report Status 04/05/2018 FINAL  Final   Organism ID, Bacteria KLEBSIELLA PNEUMONIAE (A)  Final      Susceptibility   Klebsiella pneumoniae - MIC*    AMPICILLIN >=32 RESISTANT Resistant     CEFAZOLIN <=4 SENSITIVE Sensitive     CEFTRIAXONE <=1 SENSITIVE Sensitive     CIPROFLOXACIN <=0.25 SENSITIVE Sensitive     GENTAMICIN <=1 SENSITIVE Sensitive     IMIPENEM <=0.25 SENSITIVE Sensitive     NITROFURANTOIN 32 SENSITIVE Sensitive     TRIMETH/SULFA >=320 RESISTANT Resistant     AMPICILLIN/SULBACTAM 8 SENSITIVE Sensitive     PIP/TAZO <=4 SENSITIVE Sensitive     Extended ESBL NEGATIVE Sensitive     * >=100,000 COLONIES/mL KLEBSIELLA PNEUMONIAE     Time coordinating discharge: 45 minutes  SIGNED:   Coralie KeensMauricio Daniel Arrien, MD  [facility] 04/05/2018, 9:45 AM Pager [phone]  If 7PM-7AM, please contact night-coverage [email or web address] Password [email or web address]

## 2018-04-05 NOTE — Progress Notes (Signed)
Clinical Social Worker consulted to assist patient in returning back to [facility]. CSW contacted facility and spoke with Burna MortimerWanda. Burna MortimerWanda stated patient is able to return back to facility.  Per RN patients daughter will transport patient back to ALF herself. Report number is [phone] ask to speak to StockwellWanda. CSW signing off as patients social work needs have been meet.    Katherine MoodAshley Perrion Diesel, MSW,  [employer]CSWA [phone]

## 2018-04-05 NOTE — Care Management Important Message (Signed)
Important Message  Patient Details  Name: Katherine Kirby MRN: [id number] Date of Birth: [birth year]   Medicare Important Message Given:  Yes    [name] 04/05/2018, 2:22 PM

## 2018-04-05 NOTE — Care Management Note (Signed)
Case Management Note  Patient Details  Name: Katherine Kirby MRN: [id number] Date of Birth: 11/19/1921  Subjective/Objective:                    Action/Plan: Patient discharging back to her ALF ([facility]) today. CM signing off.   Expected Discharge Date:  04/05/18               Expected Discharge Plan:  Assisted Living / Rest Home  In-House Referral:  Clinical Social Work  Discharge planning Services     Post Acute Care Choice:    Choice offered to:     DME Arranged:    DME Agency:     HH Arranged:    HH Agency:     Status of Service:  Completed, signed off  If discussed at [employer] Length of [employer] Meetings, dates discussed:    Additional Comments:  Kermit BaloKelli F Almarosa Bohac, RN 04/05/2018, 11:21 AM

## 2018-04-05 NOTE — Plan of Care (Signed)
Adequate for discharge.

## 2018-04-11 DIAGNOSIS — N179 Acute kidney failure, unspecified: Secondary | ICD-10-CM | POA: Diagnosis not present

## 2018-04-11 DIAGNOSIS — I509 Heart failure, unspecified: Secondary | ICD-10-CM | POA: Diagnosis not present

## 2018-04-11 DIAGNOSIS — E860 Dehydration: Secondary | ICD-10-CM | POA: Diagnosis not present

## 2018-04-11 DIAGNOSIS — N390 Urinary tract infection, site not specified: Secondary | ICD-10-CM | POA: Diagnosis not present

## 2018-04-11 DIAGNOSIS — I10 Essential (primary) hypertension: Secondary | ICD-10-CM | POA: Diagnosis not present

## 2018-04-30 DIAGNOSIS — E860 Dehydration: Secondary | ICD-10-CM | POA: Diagnosis not present

## 2018-04-30 DIAGNOSIS — I10 Essential (primary) hypertension: Secondary | ICD-10-CM | POA: Diagnosis not present

## 2018-04-30 DIAGNOSIS — I509 Heart failure, unspecified: Secondary | ICD-10-CM | POA: Diagnosis not present

## 2018-05-02 DIAGNOSIS — I10 Essential (primary) hypertension: Secondary | ICD-10-CM | POA: Diagnosis not present

## 2018-05-02 DIAGNOSIS — N390 Urinary tract infection, site not specified: Secondary | ICD-10-CM | POA: Diagnosis not present

## 2018-05-02 DIAGNOSIS — Z6821 Body mass index (BMI) 21.0-21.9, adult: Secondary | ICD-10-CM | POA: Diagnosis not present

## 2018-05-02 DIAGNOSIS — Z23 Encounter for immunization: Secondary | ICD-10-CM | POA: Diagnosis not present

## 2018-05-04 DIAGNOSIS — H6123 Impacted cerumen, bilateral: Secondary | ICD-10-CM | POA: Diagnosis not present

## 2018-05-04 DIAGNOSIS — Z8781 Personal history of (healed) traumatic fracture: Secondary | ICD-10-CM | POA: Diagnosis not present

## 2018-05-04 DIAGNOSIS — L97211 Non-pressure chronic ulcer of right calf limited to breakdown of skin: Secondary | ICD-10-CM | POA: Diagnosis not present

## 2018-05-04 DIAGNOSIS — I872 Venous insufficiency (chronic) (peripheral): Secondary | ICD-10-CM | POA: Diagnosis not present

## 2018-05-04 DIAGNOSIS — Z7982 Long term (current) use of aspirin: Secondary | ICD-10-CM | POA: Diagnosis not present

## 2018-05-04 DIAGNOSIS — B351 Tinea unguium: Secondary | ICD-10-CM | POA: Diagnosis not present

## 2018-05-04 DIAGNOSIS — F0390 Unspecified dementia without behavioral disturbance: Secondary | ICD-10-CM | POA: Diagnosis not present

## 2018-05-04 DIAGNOSIS — D649 Anemia, unspecified: Secondary | ICD-10-CM | POA: Diagnosis not present

## 2018-05-04 DIAGNOSIS — M1610 Unilateral primary osteoarthritis, unspecified hip: Secondary | ICD-10-CM | POA: Diagnosis not present

## 2018-05-04 DIAGNOSIS — I509 Heart failure, unspecified: Secondary | ICD-10-CM | POA: Diagnosis not present

## 2018-05-04 DIAGNOSIS — Z792 Long term (current) use of antibiotics: Secondary | ICD-10-CM | POA: Diagnosis not present

## 2018-05-04 DIAGNOSIS — Z9181 History of falling: Secondary | ICD-10-CM | POA: Diagnosis not present

## 2018-05-04 DIAGNOSIS — N390 Urinary tract infection, site not specified: Secondary | ICD-10-CM | POA: Diagnosis not present

## 2018-05-04 DIAGNOSIS — M549 Dorsalgia, unspecified: Secondary | ICD-10-CM | POA: Diagnosis not present

## 2018-05-04 DIAGNOSIS — F411 Generalized anxiety disorder: Secondary | ICD-10-CM | POA: Diagnosis not present

## 2018-05-04 DIAGNOSIS — N289 Disorder of kidney and ureter, unspecified: Secondary | ICD-10-CM | POA: Diagnosis not present

## 2018-05-04 DIAGNOSIS — B373 Candidiasis of vulva and vagina: Secondary | ICD-10-CM | POA: Diagnosis not present

## 2018-05-04 DIAGNOSIS — I110 Hypertensive heart disease with heart failure: Secondary | ICD-10-CM | POA: Diagnosis not present

## 2018-05-04 DIAGNOSIS — M19011 Primary osteoarthritis, right shoulder: Secondary | ICD-10-CM | POA: Diagnosis not present

## 2018-05-07 DIAGNOSIS — Z6822 Body mass index (BMI) 22.0-22.9, adult: Secondary | ICD-10-CM | POA: Diagnosis not present

## 2018-05-07 DIAGNOSIS — S81801D Unspecified open wound, right lower leg, subsequent encounter: Secondary | ICD-10-CM | POA: Diagnosis not present

## 2018-05-08 DIAGNOSIS — I509 Heart failure, unspecified: Secondary | ICD-10-CM | POA: Diagnosis not present

## 2018-05-08 DIAGNOSIS — N390 Urinary tract infection, site not specified: Secondary | ICD-10-CM | POA: Diagnosis not present

## 2018-05-08 DIAGNOSIS — F411 Generalized anxiety disorder: Secondary | ICD-10-CM | POA: Diagnosis not present

## 2018-05-08 DIAGNOSIS — L97211 Non-pressure chronic ulcer of right calf limited to breakdown of skin: Secondary | ICD-10-CM | POA: Diagnosis not present

## 2018-05-08 DIAGNOSIS — I110 Hypertensive heart disease with heart failure: Secondary | ICD-10-CM | POA: Diagnosis not present

## 2018-05-08 DIAGNOSIS — I872 Venous insufficiency (chronic) (peripheral): Secondary | ICD-10-CM | POA: Diagnosis not present

## 2018-05-09 DIAGNOSIS — I872 Venous insufficiency (chronic) (peripheral): Secondary | ICD-10-CM | POA: Diagnosis not present

## 2018-05-09 DIAGNOSIS — I110 Hypertensive heart disease with heart failure: Secondary | ICD-10-CM | POA: Diagnosis not present

## 2018-05-09 DIAGNOSIS — L97211 Non-pressure chronic ulcer of right calf limited to breakdown of skin: Secondary | ICD-10-CM | POA: Diagnosis not present

## 2018-05-09 DIAGNOSIS — I509 Heart failure, unspecified: Secondary | ICD-10-CM | POA: Diagnosis not present

## 2018-05-09 DIAGNOSIS — F411 Generalized anxiety disorder: Secondary | ICD-10-CM | POA: Diagnosis not present

## 2018-05-09 DIAGNOSIS — N390 Urinary tract infection, site not specified: Secondary | ICD-10-CM | POA: Diagnosis not present

## 2018-05-11 DIAGNOSIS — I110 Hypertensive heart disease with heart failure: Secondary | ICD-10-CM | POA: Diagnosis not present

## 2018-05-11 DIAGNOSIS — L97211 Non-pressure chronic ulcer of right calf limited to breakdown of skin: Secondary | ICD-10-CM | POA: Diagnosis not present

## 2018-05-11 DIAGNOSIS — N390 Urinary tract infection, site not specified: Secondary | ICD-10-CM | POA: Diagnosis not present

## 2018-05-11 DIAGNOSIS — F411 Generalized anxiety disorder: Secondary | ICD-10-CM | POA: Diagnosis not present

## 2018-05-11 DIAGNOSIS — I509 Heart failure, unspecified: Secondary | ICD-10-CM | POA: Diagnosis not present

## 2018-05-11 DIAGNOSIS — I872 Venous insufficiency (chronic) (peripheral): Secondary | ICD-10-CM | POA: Diagnosis not present

## 2018-05-14 DIAGNOSIS — N390 Urinary tract infection, site not specified: Secondary | ICD-10-CM | POA: Diagnosis not present

## 2018-05-14 DIAGNOSIS — F411 Generalized anxiety disorder: Secondary | ICD-10-CM | POA: Diagnosis not present

## 2018-05-14 DIAGNOSIS — I872 Venous insufficiency (chronic) (peripheral): Secondary | ICD-10-CM | POA: Diagnosis not present

## 2018-05-14 DIAGNOSIS — I509 Heart failure, unspecified: Secondary | ICD-10-CM | POA: Diagnosis not present

## 2018-05-14 DIAGNOSIS — L97211 Non-pressure chronic ulcer of right calf limited to breakdown of skin: Secondary | ICD-10-CM | POA: Diagnosis not present

## 2018-05-14 DIAGNOSIS — R635 Abnormal weight gain: Secondary | ICD-10-CM | POA: Diagnosis not present

## 2018-05-14 DIAGNOSIS — I10 Essential (primary) hypertension: Secondary | ICD-10-CM | POA: Diagnosis not present

## 2018-05-14 DIAGNOSIS — I110 Hypertensive heart disease with heart failure: Secondary | ICD-10-CM | POA: Diagnosis not present

## 2018-05-14 DIAGNOSIS — Z0000 Encounter for general adult medical examination without abnormal findings: Secondary | ICD-10-CM | POA: Diagnosis not present

## 2018-05-14 DIAGNOSIS — Z6821 Body mass index (BMI) 21.0-21.9, adult: Secondary | ICD-10-CM | POA: Diagnosis not present

## 2018-05-16 DIAGNOSIS — L97211 Non-pressure chronic ulcer of right calf limited to breakdown of skin: Secondary | ICD-10-CM | POA: Diagnosis not present

## 2018-05-16 DIAGNOSIS — I110 Hypertensive heart disease with heart failure: Secondary | ICD-10-CM | POA: Diagnosis not present

## 2018-05-16 DIAGNOSIS — N390 Urinary tract infection, site not specified: Secondary | ICD-10-CM | POA: Diagnosis not present

## 2018-05-16 DIAGNOSIS — I872 Venous insufficiency (chronic) (peripheral): Secondary | ICD-10-CM | POA: Diagnosis not present

## 2018-05-16 DIAGNOSIS — I509 Heart failure, unspecified: Secondary | ICD-10-CM | POA: Diagnosis not present

## 2018-05-16 DIAGNOSIS — F411 Generalized anxiety disorder: Secondary | ICD-10-CM | POA: Diagnosis not present

## 2018-05-20 DIAGNOSIS — R609 Edema, unspecified: Secondary | ICD-10-CM | POA: Diagnosis not present

## 2018-05-20 DIAGNOSIS — Z6823 Body mass index (BMI) 23.0-23.9, adult: Secondary | ICD-10-CM | POA: Diagnosis not present

## 2018-05-20 DIAGNOSIS — I509 Heart failure, unspecified: Secondary | ICD-10-CM | POA: Diagnosis not present

## 2018-05-20 DIAGNOSIS — I10 Essential (primary) hypertension: Secondary | ICD-10-CM | POA: Diagnosis not present

## 2018-05-20 DIAGNOSIS — I872 Venous insufficiency (chronic) (peripheral): Secondary | ICD-10-CM | POA: Diagnosis not present

## 2018-05-20 DIAGNOSIS — I110 Hypertensive heart disease with heart failure: Secondary | ICD-10-CM | POA: Diagnosis not present

## 2018-05-20 DIAGNOSIS — F411 Generalized anxiety disorder: Secondary | ICD-10-CM | POA: Diagnosis not present

## 2018-05-20 DIAGNOSIS — N390 Urinary tract infection, site not specified: Secondary | ICD-10-CM | POA: Diagnosis not present

## 2018-05-20 DIAGNOSIS — L97211 Non-pressure chronic ulcer of right calf limited to breakdown of skin: Secondary | ICD-10-CM | POA: Diagnosis not present

## 2018-05-23 DIAGNOSIS — L97211 Non-pressure chronic ulcer of right calf limited to breakdown of skin: Secondary | ICD-10-CM | POA: Diagnosis not present

## 2018-05-23 DIAGNOSIS — I509 Heart failure, unspecified: Secondary | ICD-10-CM | POA: Diagnosis not present

## 2018-05-23 DIAGNOSIS — F411 Generalized anxiety disorder: Secondary | ICD-10-CM | POA: Diagnosis not present

## 2018-05-23 DIAGNOSIS — N390 Urinary tract infection, site not specified: Secondary | ICD-10-CM | POA: Diagnosis not present

## 2018-05-23 DIAGNOSIS — I110 Hypertensive heart disease with heart failure: Secondary | ICD-10-CM | POA: Diagnosis not present

## 2018-05-23 DIAGNOSIS — I872 Venous insufficiency (chronic) (peripheral): Secondary | ICD-10-CM | POA: Diagnosis not present

## 2018-05-27 DIAGNOSIS — I509 Heart failure, unspecified: Secondary | ICD-10-CM | POA: Diagnosis not present

## 2018-05-27 DIAGNOSIS — I10 Essential (primary) hypertension: Secondary | ICD-10-CM | POA: Diagnosis not present

## 2018-05-27 DIAGNOSIS — D649 Anemia, unspecified: Secondary | ICD-10-CM | POA: Diagnosis not present

## 2018-05-27 DIAGNOSIS — N289 Disorder of kidney and ureter, unspecified: Secondary | ICD-10-CM | POA: Diagnosis not present

## 2018-05-28 DIAGNOSIS — I110 Hypertensive heart disease with heart failure: Secondary | ICD-10-CM | POA: Diagnosis not present

## 2018-05-28 DIAGNOSIS — N390 Urinary tract infection, site not specified: Secondary | ICD-10-CM | POA: Diagnosis not present

## 2018-05-28 DIAGNOSIS — I509 Heart failure, unspecified: Secondary | ICD-10-CM | POA: Diagnosis not present

## 2018-05-28 DIAGNOSIS — F411 Generalized anxiety disorder: Secondary | ICD-10-CM | POA: Diagnosis not present

## 2018-05-28 DIAGNOSIS — I872 Venous insufficiency (chronic) (peripheral): Secondary | ICD-10-CM | POA: Diagnosis not present

## 2018-05-28 DIAGNOSIS — L97211 Non-pressure chronic ulcer of right calf limited to breakdown of skin: Secondary | ICD-10-CM | POA: Diagnosis not present

## 2018-05-29 DIAGNOSIS — I10 Essential (primary) hypertension: Secondary | ICD-10-CM | POA: Diagnosis not present

## 2018-05-29 DIAGNOSIS — R609 Edema, unspecified: Secondary | ICD-10-CM | POA: Diagnosis not present

## 2018-05-29 DIAGNOSIS — N289 Disorder of kidney and ureter, unspecified: Secondary | ICD-10-CM | POA: Diagnosis not present

## 2018-05-29 DIAGNOSIS — Z6821 Body mass index (BMI) 21.0-21.9, adult: Secondary | ICD-10-CM | POA: Diagnosis not present

## 2018-05-29 DIAGNOSIS — I509 Heart failure, unspecified: Secondary | ICD-10-CM | POA: Diagnosis not present

## 2018-05-30 DIAGNOSIS — I872 Venous insufficiency (chronic) (peripheral): Secondary | ICD-10-CM | POA: Diagnosis not present

## 2018-05-30 DIAGNOSIS — N390 Urinary tract infection, site not specified: Secondary | ICD-10-CM | POA: Diagnosis not present

## 2018-05-30 DIAGNOSIS — I509 Heart failure, unspecified: Secondary | ICD-10-CM | POA: Diagnosis not present

## 2018-05-30 DIAGNOSIS — F411 Generalized anxiety disorder: Secondary | ICD-10-CM | POA: Diagnosis not present

## 2018-05-30 DIAGNOSIS — I110 Hypertensive heart disease with heart failure: Secondary | ICD-10-CM | POA: Diagnosis not present

## 2018-05-30 DIAGNOSIS — L97211 Non-pressure chronic ulcer of right calf limited to breakdown of skin: Secondary | ICD-10-CM | POA: Diagnosis not present

## 2018-06-03 DIAGNOSIS — Q248 Other specified congenital malformations of heart: Secondary | ICD-10-CM | POA: Diagnosis not present

## 2018-06-03 DIAGNOSIS — R600 Localized edema: Secondary | ICD-10-CM | POA: Diagnosis not present

## 2018-06-03 DIAGNOSIS — I10 Essential (primary) hypertension: Secondary | ICD-10-CM | POA: Diagnosis not present

## 2018-06-03 DIAGNOSIS — I5031 Acute diastolic (congestive) heart failure: Secondary | ICD-10-CM | POA: Diagnosis not present

## 2018-06-04 DIAGNOSIS — L97211 Non-pressure chronic ulcer of right calf limited to breakdown of skin: Secondary | ICD-10-CM | POA: Diagnosis not present

## 2018-06-04 DIAGNOSIS — I110 Hypertensive heart disease with heart failure: Secondary | ICD-10-CM | POA: Diagnosis not present

## 2018-06-04 DIAGNOSIS — N390 Urinary tract infection, site not specified: Secondary | ICD-10-CM | POA: Diagnosis not present

## 2018-06-04 DIAGNOSIS — F411 Generalized anxiety disorder: Secondary | ICD-10-CM | POA: Diagnosis not present

## 2018-06-04 DIAGNOSIS — I509 Heart failure, unspecified: Secondary | ICD-10-CM | POA: Diagnosis not present

## 2018-06-04 DIAGNOSIS — I872 Venous insufficiency (chronic) (peripheral): Secondary | ICD-10-CM | POA: Diagnosis not present

## 2018-06-11 DIAGNOSIS — N289 Disorder of kidney and ureter, unspecified: Secondary | ICD-10-CM | POA: Diagnosis not present

## 2018-06-11 DIAGNOSIS — I509 Heart failure, unspecified: Secondary | ICD-10-CM | POA: Diagnosis not present

## 2018-06-11 DIAGNOSIS — I10 Essential (primary) hypertension: Secondary | ICD-10-CM | POA: Diagnosis not present

## 2018-06-13 DIAGNOSIS — N390 Urinary tract infection, site not specified: Secondary | ICD-10-CM | POA: Diagnosis not present

## 2018-06-13 DIAGNOSIS — R350 Frequency of micturition: Secondary | ICD-10-CM | POA: Diagnosis not present

## 2018-06-13 DIAGNOSIS — I110 Hypertensive heart disease with heart failure: Secondary | ICD-10-CM | POA: Diagnosis not present

## 2018-06-13 DIAGNOSIS — L97211 Non-pressure chronic ulcer of right calf limited to breakdown of skin: Secondary | ICD-10-CM | POA: Diagnosis not present

## 2018-06-13 DIAGNOSIS — I509 Heart failure, unspecified: Secondary | ICD-10-CM | POA: Diagnosis not present

## 2018-06-13 DIAGNOSIS — I10 Essential (primary) hypertension: Secondary | ICD-10-CM | POA: Diagnosis not present

## 2018-06-13 DIAGNOSIS — I872 Venous insufficiency (chronic) (peripheral): Secondary | ICD-10-CM | POA: Diagnosis not present

## 2018-06-13 DIAGNOSIS — F411 Generalized anxiety disorder: Secondary | ICD-10-CM | POA: Diagnosis not present

## 2018-06-13 DIAGNOSIS — Z6823 Body mass index (BMI) 23.0-23.9, adult: Secondary | ICD-10-CM | POA: Diagnosis not present

## 2018-06-17 DIAGNOSIS — I5031 Acute diastolic (congestive) heart failure: Secondary | ICD-10-CM | POA: Diagnosis not present

## 2018-06-18 DIAGNOSIS — S81801D Unspecified open wound, right lower leg, subsequent encounter: Secondary | ICD-10-CM | POA: Diagnosis not present

## 2018-06-24 DIAGNOSIS — S81801S Unspecified open wound, right lower leg, sequela: Secondary | ICD-10-CM | POA: Diagnosis not present

## 2018-06-24 DIAGNOSIS — R609 Edema, unspecified: Secondary | ICD-10-CM | POA: Diagnosis not present

## 2018-06-24 DIAGNOSIS — I509 Heart failure, unspecified: Secondary | ICD-10-CM | POA: Diagnosis not present

## 2018-06-27 ENCOUNTER — Emergency Department (HOSPITAL_COMMUNITY): Payer: Medicare Other

## 2018-06-27 ENCOUNTER — Encounter (HOSPITAL_COMMUNITY): Payer: Self-pay | Admitting: Emergency Medicine

## 2018-06-27 ENCOUNTER — Other Ambulatory Visit: Payer: Self-pay

## 2018-06-27 ENCOUNTER — Emergency Department (HOSPITAL_COMMUNITY)
Admission: EM | Admit: 2018-06-27 | Discharge: 2018-06-27 | Disposition: A | Discharge status: Home / Self Care | Payer: Medicare Other | Attending: Emergency Medicine | Admitting: Emergency Medicine

## 2018-06-27 DIAGNOSIS — R0902 Hypoxemia: Secondary | ICD-10-CM | POA: Diagnosis not present

## 2018-06-27 DIAGNOSIS — F0390 Unspecified dementia without behavioral disturbance: Secondary | ICD-10-CM | POA: Diagnosis not present

## 2018-06-27 DIAGNOSIS — Z7982 Long term (current) use of aspirin: Secondary | ICD-10-CM | POA: Diagnosis not present

## 2018-06-27 DIAGNOSIS — I129 Hypertensive chronic kidney disease with stage 1 through stage 4 chronic kidney disease, or unspecified chronic kidney disease: Secondary | ICD-10-CM | POA: Diagnosis not present

## 2018-06-27 DIAGNOSIS — R4182 Altered mental status, unspecified: Secondary | ICD-10-CM | POA: Insufficient documentation

## 2018-06-27 DIAGNOSIS — N183 Chronic kidney disease, stage 3 (moderate): Secondary | ICD-10-CM | POA: Diagnosis not present

## 2018-06-27 DIAGNOSIS — N390 Urinary tract infection, site not specified: Secondary | ICD-10-CM | POA: Insufficient documentation

## 2018-06-27 DIAGNOSIS — Z79899 Other long term (current) drug therapy: Secondary | ICD-10-CM | POA: Insufficient documentation

## 2018-06-27 LAB — COMPREHENSIVE METABOLIC PANEL
ALT: 9 U/L (ref 0–44)
ANION GAP: 11 (ref 5–15)
AST: 17 U/L (ref 15–41)
Albumin: 3.6 g/dL (ref 3.5–5.0)
Alkaline Phosphatase: 63 U/L (ref 38–126)
BILIRUBIN TOTAL: 0.5 mg/dL (ref 0.3–1.2)
BUN: 62 mg/dL — AB (ref 8–23)
CHLORIDE: 104 mmol/L (ref 98–111)
CO2: 23 mmol/L (ref 22–32)
Calcium: 9.6 mg/dL (ref 8.9–10.3)
Creatinine, Ser: 1.36 mg/dL — ABNORMAL HIGH (ref 0.44–1.00)
GFR calc Af Amer: 37 mL/min — ABNORMAL LOW (ref >60)
GFR, EST NON AFRICAN AMERICAN: 32 mL/min — AB (ref >60)
Glucose, Bld: 102 mg/dL — ABNORMAL HIGH (ref 70–99)
POTASSIUM: 4.2 mmol/L (ref 3.5–5.1)
Sodium: 138 mmol/L (ref 135–145)
TOTAL PROTEIN: 6 g/dL — AB (ref 6.5–8.1)

## 2018-06-27 LAB — CBC WITH DIFFERENTIAL/PLATELET
Abs Immature Granulocytes: 0.08 10*3/uL — ABNORMAL HIGH (ref 0.00–0.07)
BASOS PCT: 0 %
Basophils Absolute: 0 10*3/uL (ref 0.0–0.1)
EOS ABS: 0.2 10*3/uL (ref 0.0–0.5)
EOS PCT: 3 %
HEMATOCRIT: 32.7 % — AB (ref 36.0–46.0)
Hemoglobin: 10 g/dL — ABNORMAL LOW (ref 12.0–15.0)
IMMATURE GRANULOCYTES: 2 %
LYMPHS ABS: 0.5 10*3/uL — AB (ref 0.7–4.0)
Lymphocytes Relative: 10 %
MCH: 29.2 pg (ref 26.0–34.0)
MCHC: 30.6 g/dL (ref 30.0–36.0)
MCV: 95.6 fL (ref 80.0–100.0)
MONOS PCT: 11 %
Monocytes Absolute: 0.6 10*3/uL (ref 0.1–1.0)
NEUTROS PCT: 74 %
NRBC: 0 % (ref 0.0–0.2)
Neutro Abs: 4 10*3/uL (ref 1.7–7.7)
PLATELETS: 179 10*3/uL (ref 150–400)
RBC: 3.42 MIL/uL — ABNORMAL LOW (ref 3.87–5.11)
RDW: 13.4 % (ref 11.5–15.5)
WBC: 5.4 10*3/uL (ref 4.0–10.5)

## 2018-06-27 LAB — URINALYSIS, ROUTINE W REFLEX MICROSCOPIC
BILIRUBIN URINE: NEGATIVE
GLUCOSE, UA: NEGATIVE mg/dL
Ketones, ur: NEGATIVE mg/dL
NITRITE: NEGATIVE
PROTEIN: NEGATIVE mg/dL
SPECIFIC GRAVITY, URINE: 1.015 (ref 1.005–1.030)
pH: 6 (ref 5.0–8.0)

## 2018-06-27 LAB — I-STAT CG4 LACTIC ACID, ED: LACTIC ACID, VENOUS: 1.02 mmol/L (ref 0.5–1.9)

## 2018-06-27 LAB — I-STAT TROPONIN, ED: TROPONIN I, POC: 0 ng/mL (ref 0.00–0.08)

## 2018-06-27 MED ORDER — CEPHALEXIN 500 MG PO CAPS: 500.0000 mg | ORAL_CAPSULE | Freq: Three times a day (TID) | ORAL | 0 refills | Status: AC

## 2018-06-27 MED ORDER — SODIUM CHLORIDE 0.9 % IV SOLN
1.0000 g | Freq: Once | INTRAVENOUS | Status: AC
  Administered 2018-06-27: 1 g via INTRAVENOUS
  Filled 2018-06-27: qty 10

## 2018-06-27 MED ORDER — SODIUM CHLORIDE 0.9 % IV BOLUS
500.0000 mL | Freq: Once | INTRAVENOUS | Status: AC
  Administered 2018-06-27: 500 mL via INTRAVENOUS

## 2018-06-27 NOTE — ED Notes (Signed)
Dc reviewed with son and daughter inlaw

## 2018-06-27 NOTE — ED Triage Notes (Signed)
Patient arrived from [facility] via EMS. Staff reports to EMS that patient refused to talk this morning. Staff also reports that patient had a recent UTI.   On arrival patient hs been calling Jillyn Hidden, when asked who is Jillyn Hidden, Patient states "my son."

## 2018-06-27 NOTE — ED Provider Notes (Signed)
[facility] EMERGENCY DEPARTMENT Provider Note   CSN: [id number] Arrival date & time: 06/27/18  0818     History   Chief Complaint No chief complaint on file.   HPI Katherine Kirby is a 84 y.o. female.  HPI   84 year old female with a history of hypertension, CKD, dementia who presents with concern for altered mental status.  Family reports that she was in normal state of health last night, however this morning, the assisted living facility called EMS as patient was not acting herself.  They report that she appears more sleepy, staring off, confused.  They report that she is active this way with prior urinary tract infections.  Denies falls, head trauma, headache, infectious symptoms.  Reports that she has been on and off of Lasix due to alternating swelling and renal problems, however most recently, her swelling has improved and she is currently off Lasix.  No other medication changes.  Past Medical History:  Diagnosis Date  . Anemia   . CKD (chronic kidney disease) stage 3, GFR 30-59 ml/min (HCC)   . Dementia (HCC)   . Hypertension   . Lumbar compression fracture (HCC)   . Pelvis fracture (HCC)   . Thoracic compression fracture [facility])     Patient Active Problem List   Diagnosis Date Noted  . Acute encephalopathy 04/03/2018  . Acute metabolic encephalopathy 11/03/2017  . Urinary tract infection 11/03/2017  . Constipation 11/03/2017  . Anemia 11/03/2017  . CKD (chronic kidney disease) stage 3, GFR 30-59 ml/min (HCC) 11/03/2017  . Thoracic compression fracture (HCC) 11/03/2017  . High blood pressure 11/03/2017  . Mild dementia (HCC) 11/03/2017  . Abnormal urinalysis 09/17/2017  . Respiratory insufficiency 09/17/2017  . HTN (hypertension) 09/17/2017  . Acute kidney injury (HCC) 09/17/2017  . Normocytic anemia 09/17/2017  . HCAP (healthcare-associated pneumonia) 09/17/2017  . Compression fracture of lumbar spine, non-traumatic (HCC) 10/18/2015  . Lumbar  compression fracture (HCC) 10/18/2015  . Spinal stenosis 10/18/2015  . Difficulty walking 10/18/2015  . Acute confusional state of infective origin 10/18/2015  . AKI (acute kidney injury) (HCC) 10/18/2015  . Hypertension 10/18/2015  . Hip pain   . Essential hypertension     Past Surgical History:  Procedure Laterality Date  . TONSILLECTOMY    . TOTAL HIP ARTHROPLASTY Left 2007     OB History   None      Home Medications    Prior to Admission medications   Medication Sig Start Date End Date Taking? Authorizing Provider  acetaminophen (TYLENOL) 325 MG tablet Take 650 mg by mouth every 6 (six) hours as needed for mild pain.   Yes [provider]  albuterol (PROVENTIL) (2.5 MG/3ML) 0.083% nebulizer solution Take 2.5 mg by nebulization every 4 (four) hours as needed for wheezing or shortness of breath.    Yes [provider]  aspirin 81 MG chewable tablet Chew 81 mg by mouth daily.   Yes [provider]  bisacodyl (DULCOLAX) 5 MG EC tablet Take 2 tablets (10 mg total) by mouth daily as needed for moderate constipation. 09/24/17  Yes Regalado, Belkys A, MD  calcitonin, salmon, (MIACALCIN/FORTICAL) 200 UNIT/ACT nasal spray Place 1 spray into alternate nostrils See admin instructions. Spray 1 spray into Right nostril every other day then alternating with left nostril   Yes [provider]  Calcium Carb-Cholecalciferol (CALCIUM 600+D3 PO) Take 1 tablet by mouth daily with breakfast.   Yes [provider]  Cyanocobalamin (VITAMIN B 12 PO) Take 100  mcg by mouth daily.    Yes [provider]  estradiol (ESTRACE) 0.1 MG/GM vaginal cream Place 1 g vaginally daily.   Yes [provider]  furosemide (LASIX) 20 MG tablet Take 20 mg by mouth 2 (two) times daily. 06/13/18  Yes [provider]  ipratropium (ATROVENT) 0.02 % nebulizer solution Take 0.5 mg by nebulization 2 (two) times daily as needed for wheezing or shortness of  breath.   Yes [provider]  metoprolol succinate (TOPROL-XL) 50 MG 24 hr tablet Take 1 tablet (50 mg total) by mouth daily. Take with or immediately following a meal. Patient taking differently: Take 25-50 mg by mouth See admin instructions. Take one tablet (50mg ) by mouth every morning, then take one half tablet (25MG ) every evening before bedtime. 09/25/17  Yes Regalado, Belkys A, MD  Neomy-Bacit-Polymyx-Pramoxine (QC TRIPLE ANTIBIOTIC EX) Apply 1 application topically 2 (two) times daily. Wash wound on right lower leg with warm soapy water and pat dry twice daily. Cover with non adherent guaze.   Yes [provider]  nitrofurantoin (MACRODANTIN) 100 MG capsule Take 100 mg by mouth daily. Take with food or milk 06/25/18  Yes [provider]  nystatin cream (MYCOSTATIN) Apply 1 application topically See admin instructions. Apply to affected areas every 12 hours as needed for diaper rash.   Yes [provider]  olmesartan (BENICAR) 40 MG tablet Take 40 mg by mouth daily.   Yes [provider]  polyethylene glycol (MIRALAX) packet Take 17 g by mouth daily as needed for moderate constipation (over the counter). Patient taking differently: Take 17 g by mouth daily as needed for moderate constipation.  11/05/17  Yes Rai, Delene Ruffini, MD  Skin Protectants, Misc. (BAZA PROTECT EX) Apply 1 application topically as needed (apply to buttocks wit each diaper change as needed).   Yes [provider]  cephALEXin (KEFLEX) 500 MG capsule Take 1 capsule (500 mg total) by mouth 3 (three) times daily for 10 days. 06/27/18 07/07/18  Alvira Monday, MD  hydrALAZINE (APRESOLINE) 25 MG tablet Take 1 tablet (25 mg total) by mouth every 8 (eight) hours. Patient not taking: Reported on 06/27/2018 09/24/17   Alba Cory, MD    Family History No family history on file.  Social History Social History   Tobacco Use  . Smoking status: Never Smoker  . Smokeless  tobacco: Never Used  Substance Use Topics  . Alcohol use: Not Currently    Comment: ocasional  . Drug use: No     Allergies   Patient has no known allergies.   Review of Systems Review of Systems  Unable to perform ROS: Dementia  Constitutional: Positive for fatigue. Negative for fever.  HENT: Negative for sore throat.   Eyes: Negative for visual disturbance.  Respiratory: Negative for cough and shortness of breath.   Cardiovascular: Negative for chest pain.  Gastrointestinal: Negative for abdominal pain.  Genitourinary: Negative for difficulty urinating.  Musculoskeletal: Negative for back pain and neck pain.  Skin: Negative for rash.  Neurological: Negative for syncope and headaches.  Psychiatric/Behavioral: Positive for confusion.     Physical Exam Updated Vital Signs BP (!) 135/57   Pulse 65   Temp 98.2 F (36.8 C) (Rectal)   Resp 14   SpO2 95%   Physical Exam  Constitutional: She appears well-developed and well-nourished. No distress.  HENT:  Head: Normocephalic and atraumatic.  Eyes: Conjunctivae and EOM are normal.  Neck: Normal range of motion.  Cardiovascular: Normal  rate, regular rhythm, normal heart sounds and intact distal pulses. Exam reveals no gallop and no friction rub.  No murmur heard. Pulmonary/Chest: Effort normal and breath sounds normal. No respiratory distress. She has no wheezes. She has no rales.  Abdominal: Soft. She exhibits no distension. There is no tenderness. There is no guarding.  Musculoskeletal: She exhibits no edema or tenderness.  Neurological: She is alert. She has normal strength. No cranial nerve deficit or sensory deficit. GCS eye subscore is 4. GCS verbal subscore is 5. GCS motor subscore is 6.  Oriented to self, location  Skin: Skin is warm and dry. No rash noted. She is not diaphoretic. No erythema.  Wound posterior ankle, no surrounding erythema  Nursing note and vitals reviewed.    ED Treatments / Results   Labs (all labs ordered are listed, but only abnormal results are displayed) Labs Reviewed  CBC WITH DIFFERENTIAL/PLATELET - Abnormal; Notable for the following components:      Result Value   RBC 3.42 (*)    Hemoglobin 10.0 (*)    HCT 32.7 (*)    Lymphs Abs 0.5 (*)    Abs Immature Granulocytes 0.08 (*)    All other components within normal limits  COMPREHENSIVE METABOLIC PANEL - Abnormal; Notable for the following components:   Glucose, Bld 102 (*)    BUN 62 (*)    Creatinine, Ser 1.36 (*)    Total Protein 6.0 (*)    GFR calc non Af Amer 32 (*)    GFR calc Af Amer 37 (*)    All other components within normal limits  URINALYSIS, ROUTINE W REFLEX MICROSCOPIC - Abnormal; Notable for the following components:   APPearance CLOUDY (*)    Hgb urine dipstick SMALL (*)    Leukocytes, UA LARGE (*)    Bacteria, UA FEW (*)    All other components within normal limits  URINE CULTURE  I-STAT CG4 LACTIC ACID, ED  I-STAT TROPONIN, ED  I-STAT CG4 LACTIC ACID, ED    EKG EKG Interpretation  Date/Time:  Thursday June 27 2018 08:26:41 EDT Ventricular Rate:  59 PR Interval:    QRS Duration: 110 QT Interval:  473 QTC Calculation: 469 R Axis:   1 Text Interpretation:  Sinus rhythm Abnormal R-wave progression, early transition Consider inferior infarct Borderline ST depression, lateral leads Nonspecific changes in comparison to prior Confirmed by Alvira Monday ([id number]) on 06/27/2018 12:39:33 PM   Radiology Dg Chest 2 View  Result Date: 06/27/2018 CLINICAL DATA:  Mental status changes. Dementia. Speech disturbance. EXAM: CHEST - 2 VIEW COMPARISON:  04/03/2018 FINDINGS: Heart size upper limits of normal. Chronic aortic atherosclerosis. The pulmonary vascularity is normal. Lungs are clear. No effusions. No acute bone finding. IMPRESSION: No active disease.  Aortic atherosclerosis. Electronically Signed   By: Paulina Fusi M.D.   On: 06/27/2018 09:33    Procedures Procedures (including  critical care time)  Medications Ordered in ED Medications  sodium chloride 0.9 % bolus 500 mL (0 mLs Intravenous Stopped 06/27/18 1556)  cefTRIAXone (ROCEPHIN) 1 g in sodium chloride 0.9 % 100 mL IVPB (0 g Intravenous Stopped 06/27/18 1556)     Initial Impression / Assessment and Plan / ED Course  I have reviewed the triage vital signs and the nursing notes.  Pertinent labs & imaging results that were available during my care of the patient were reviewed by me and considered in my medical decision making (see chart for details).     84 year old female with a  history of hypertension, CKD, dementia who presents with concern for altered mental status.  No focal findings to suggest CVA.  No hx to suggest head trauma, headache.  Labs show mild AKI. Given IV fluids, suspect likely dehydration. Holding lasix at this time.  CXR without signs of pneumonia.  Urinalysis shows signs of UTI.    Suspect mild encephalopathy secondary to UTI.  No signs of sepsis, lactate and WBC WNL, vital signs stable. Patient sitting up, appropriate in ED. Given rocephin, fluids. Discussed with pt and family, feel she is appropriate for outpatient management of UTI, return if worsening symptoms. Patient discharged in stable condition with understanding of reasons to return.   Final Clinical Impressions(s) / ED Diagnoses   Final diagnoses:  Altered mental status, unspecified altered mental status type  Urinary tract infection without hematuria, site unspecified    ED Discharge Orders         Ordered    cephALEXin (KEFLEX) 500 MG capsule  3 times daily     06/27/18 1537           Alvira Monday, MD 06/27/18 1809

## 2018-06-27 NOTE — ED Notes (Signed)
ED Provider at bedside. 

## 2018-07-01 LAB — URINE CULTURE: Culture: >=100000 — AB

## 2018-07-02 ENCOUNTER — Telehealth: Payer: Self-pay | Admitting: *Deleted

## 2018-07-02 DIAGNOSIS — I5031 Acute diastolic (congestive) heart failure: Secondary | ICD-10-CM | POA: Diagnosis not present

## 2018-07-02 DIAGNOSIS — Q248 Other specified congenital malformations of heart: Secondary | ICD-10-CM | POA: Diagnosis not present

## 2018-07-02 DIAGNOSIS — I340 Nonrheumatic mitral (valve) insufficiency: Secondary | ICD-10-CM | POA: Diagnosis not present

## 2018-07-02 DIAGNOSIS — I10 Essential (primary) hypertension: Secondary | ICD-10-CM | POA: Diagnosis not present

## 2018-07-02 NOTE — Progress Notes (Signed)
ED Antimicrobial Stewardship Positive Culture Follow Up   Katherine Kirby is an 84 y.o. female who presented to [facility] on 06/27/2018 with a chief complaint of No chief complaint on file.   Recent Results (from the past 720 hour(s))  Urine culture     Status: Abnormal   Collection Time: 06/27/18 11:45 AM  Result Value Ref Range Status   Specimen Description URINE, RANDOM  Final   Special Requests   Final    NONE Performed at [facility], [address] [place], [place] [address]    Culture >=100,000 COLONIES/mL ENTEROBACTER SPECIES (A)  Final   Report Status 07/01/2018 FINAL  Final   Organism ID, Bacteria ENTEROBACTER SPECIES (A)  Final      Susceptibility   Enterobacter species - MIC*    CEFAZOLIN >=64 RESISTANT Resistant     CEFTRIAXONE >=64 RESISTANT Resistant     CIPROFLOXACIN 0.5 SENSITIVE Sensitive     GENTAMICIN <=1 SENSITIVE Sensitive     IMIPENEM 0.5 SENSITIVE Sensitive     NITROFURANTOIN >=512 RESISTANT Resistant     TRIMETH/SULFA <=20 SENSITIVE Sensitive     PIP/TAZO >=128 RESISTANT Resistant     * >=100,000 COLONIES/mL ENTEROBACTER SPECIES    [x]  Treated with cephalexin, organism resistant to prescribed antimicrobial []  Patient discharged originally without antimicrobial agent and treatment is now indicated  New antibiotic prescription: DC cephalexin, start bactrim single strength 1 tablet PO BID x 3 days  ED Provider: Claude Manges, PA   Sophea Rackham, Latalia Etzler 07/02/2018, 9:06 AM Clinical Pharmacist Monday - Friday phone -  [phone] Saturday - Sunday phone - [phone]

## 2018-07-02 NOTE — Telephone Encounter (Signed)
Post ED Visit - Positive Culture Follow-up: Unsuccessful Patient Follow-up  Culture assessed and recommendations reviewed by:  []  Enzo Bi, Pharm.D. []  Celedonio Miyamoto, Pharm.D., BCPS AQ-ID []  Garvin Fila, Pharm.D., BCPS []  Georgina Pillion, Pharm.D., BCPS []  [place], [place].D., BCPS, AAHIVP []  Estella Husk, Pharm.D., BCPS, AAHIVP []  Sherlynn Carbon, PharmD []  Pollyann Samples, PharmD, BCPS Racehel Rumbarger, PharmD  Positive urine culture, reviewed by Claude Manges, PA  []  Patient discharged without antimicrobial prescription and treatment is now indicated [x]  Organism is resistant to prescribed ED discharge antimicrobial []  Patient with positive blood cultures  Plan: D/C Cephalexin and start Bactrim single strength 1 tables PO BID x 3 days.  Unable to contact patient after 3 attempts, letter will be sent to address on file  Lysle Pearl 07/02/2018, 9:42 AM

## 2018-07-08 DIAGNOSIS — R609 Edema, unspecified: Secondary | ICD-10-CM | POA: Diagnosis not present

## 2018-07-08 DIAGNOSIS — I509 Heart failure, unspecified: Secondary | ICD-10-CM | POA: Diagnosis not present

## 2018-07-08 DIAGNOSIS — Z79899 Other long term (current) drug therapy: Secondary | ICD-10-CM | POA: Diagnosis not present

## 2018-07-08 DIAGNOSIS — N390 Urinary tract infection, site not specified: Secondary | ICD-10-CM | POA: Diagnosis not present

## 2018-07-10 DIAGNOSIS — S81801D Unspecified open wound, right lower leg, subsequent encounter: Secondary | ICD-10-CM | POA: Diagnosis not present

## 2018-07-10 DIAGNOSIS — I509 Heart failure, unspecified: Secondary | ICD-10-CM | POA: Diagnosis not present

## 2018-07-10 DIAGNOSIS — F0390 Unspecified dementia without behavioral disturbance: Secondary | ICD-10-CM | POA: Diagnosis not present

## 2018-07-10 DIAGNOSIS — N390 Urinary tract infection, site not specified: Secondary | ICD-10-CM | POA: Diagnosis not present

## 2018-07-12 DIAGNOSIS — F411 Generalized anxiety disorder: Secondary | ICD-10-CM | POA: Diagnosis not present

## 2018-07-12 DIAGNOSIS — I130 Hypertensive heart and chronic kidney disease with heart failure and stage 1 through stage 4 chronic kidney disease, or unspecified chronic kidney disease: Secondary | ICD-10-CM | POA: Diagnosis not present

## 2018-07-12 DIAGNOSIS — I872 Venous insufficiency (chronic) (peripheral): Secondary | ICD-10-CM | POA: Diagnosis not present

## 2018-07-12 DIAGNOSIS — N390 Urinary tract infection, site not specified: Secondary | ICD-10-CM | POA: Diagnosis not present

## 2018-07-12 DIAGNOSIS — L97212 Non-pressure chronic ulcer of right calf with fat layer exposed: Secondary | ICD-10-CM | POA: Diagnosis not present

## 2018-07-12 DIAGNOSIS — I509 Heart failure, unspecified: Secondary | ICD-10-CM | POA: Diagnosis not present

## 2018-07-12 DIAGNOSIS — F0390 Unspecified dementia without behavioral disturbance: Secondary | ICD-10-CM | POA: Diagnosis not present

## 2018-07-12 DIAGNOSIS — K5900 Constipation, unspecified: Secondary | ICD-10-CM | POA: Diagnosis not present

## 2018-07-12 DIAGNOSIS — M4800 Spinal stenosis, site unspecified: Secondary | ICD-10-CM | POA: Diagnosis not present

## 2018-07-12 DIAGNOSIS — N183 Chronic kidney disease, stage 3 (moderate): Secondary | ICD-10-CM | POA: Diagnosis not present

## 2018-07-15 DIAGNOSIS — L603 Nail dystrophy: Secondary | ICD-10-CM | POA: Diagnosis not present

## 2018-07-15 DIAGNOSIS — I739 Peripheral vascular disease, unspecified: Secondary | ICD-10-CM | POA: Diagnosis not present

## 2018-07-16 DIAGNOSIS — I509 Heart failure, unspecified: Secondary | ICD-10-CM | POA: Diagnosis not present

## 2018-07-16 DIAGNOSIS — N390 Urinary tract infection, site not specified: Secondary | ICD-10-CM | POA: Diagnosis not present

## 2018-07-16 DIAGNOSIS — L97212 Non-pressure chronic ulcer of right calf with fat layer exposed: Secondary | ICD-10-CM | POA: Diagnosis not present

## 2018-07-16 DIAGNOSIS — I872 Venous insufficiency (chronic) (peripheral): Secondary | ICD-10-CM | POA: Diagnosis not present

## 2018-07-16 DIAGNOSIS — I130 Hypertensive heart and chronic kidney disease with heart failure and stage 1 through stage 4 chronic kidney disease, or unspecified chronic kidney disease: Secondary | ICD-10-CM | POA: Diagnosis not present

## 2018-07-16 DIAGNOSIS — N183 Chronic kidney disease, stage 3 (moderate): Secondary | ICD-10-CM | POA: Diagnosis not present

## 2018-07-19 DIAGNOSIS — I509 Heart failure, unspecified: Secondary | ICD-10-CM | POA: Diagnosis not present

## 2018-07-19 DIAGNOSIS — N390 Urinary tract infection, site not specified: Secondary | ICD-10-CM | POA: Diagnosis not present

## 2018-07-19 DIAGNOSIS — N183 Chronic kidney disease, stage 3 (moderate): Secondary | ICD-10-CM | POA: Diagnosis not present

## 2018-07-19 DIAGNOSIS — I872 Venous insufficiency (chronic) (peripheral): Secondary | ICD-10-CM | POA: Diagnosis not present

## 2018-07-19 DIAGNOSIS — I130 Hypertensive heart and chronic kidney disease with heart failure and stage 1 through stage 4 chronic kidney disease, or unspecified chronic kidney disease: Secondary | ICD-10-CM | POA: Diagnosis not present

## 2018-07-19 DIAGNOSIS — L97212 Non-pressure chronic ulcer of right calf with fat layer exposed: Secondary | ICD-10-CM | POA: Diagnosis not present

## 2018-07-22 DIAGNOSIS — I10 Essential (primary) hypertension: Secondary | ICD-10-CM | POA: Diagnosis not present

## 2018-07-22 DIAGNOSIS — I509 Heart failure, unspecified: Secondary | ICD-10-CM | POA: Diagnosis not present

## 2018-07-23 DIAGNOSIS — I509 Heart failure, unspecified: Secondary | ICD-10-CM | POA: Diagnosis not present

## 2018-07-23 DIAGNOSIS — N183 Chronic kidney disease, stage 3 (moderate): Secondary | ICD-10-CM | POA: Diagnosis not present

## 2018-07-23 DIAGNOSIS — I130 Hypertensive heart and chronic kidney disease with heart failure and stage 1 through stage 4 chronic kidney disease, or unspecified chronic kidney disease: Secondary | ICD-10-CM | POA: Diagnosis not present

## 2018-07-23 DIAGNOSIS — L97212 Non-pressure chronic ulcer of right calf with fat layer exposed: Secondary | ICD-10-CM | POA: Diagnosis not present

## 2018-07-23 DIAGNOSIS — I872 Venous insufficiency (chronic) (peripheral): Secondary | ICD-10-CM | POA: Diagnosis not present

## 2018-07-23 DIAGNOSIS — N390 Urinary tract infection, site not specified: Secondary | ICD-10-CM | POA: Diagnosis not present

## 2018-07-24 DIAGNOSIS — I10 Essential (primary) hypertension: Secondary | ICD-10-CM | POA: Diagnosis not present

## 2018-07-24 DIAGNOSIS — Z6822 Body mass index (BMI) 22.0-22.9, adult: Secondary | ICD-10-CM | POA: Diagnosis not present

## 2018-07-24 DIAGNOSIS — S81801D Unspecified open wound, right lower leg, subsequent encounter: Secondary | ICD-10-CM | POA: Diagnosis not present

## 2018-07-24 DIAGNOSIS — I509 Heart failure, unspecified: Secondary | ICD-10-CM | POA: Diagnosis not present

## 2018-07-24 DIAGNOSIS — N289 Disorder of kidney and ureter, unspecified: Secondary | ICD-10-CM | POA: Diagnosis not present

## 2018-07-26 DIAGNOSIS — L97212 Non-pressure chronic ulcer of right calf with fat layer exposed: Secondary | ICD-10-CM | POA: Diagnosis not present

## 2018-07-26 DIAGNOSIS — I872 Venous insufficiency (chronic) (peripheral): Secondary | ICD-10-CM | POA: Diagnosis not present

## 2018-07-26 DIAGNOSIS — N390 Urinary tract infection, site not specified: Secondary | ICD-10-CM | POA: Diagnosis not present

## 2018-07-26 DIAGNOSIS — N183 Chronic kidney disease, stage 3 (moderate): Secondary | ICD-10-CM | POA: Diagnosis not present

## 2018-07-26 DIAGNOSIS — I509 Heart failure, unspecified: Secondary | ICD-10-CM | POA: Diagnosis not present

## 2018-07-26 DIAGNOSIS — I130 Hypertensive heart and chronic kidney disease with heart failure and stage 1 through stage 4 chronic kidney disease, or unspecified chronic kidney disease: Secondary | ICD-10-CM | POA: Diagnosis not present

## 2018-07-29 DIAGNOSIS — I509 Heart failure, unspecified: Secondary | ICD-10-CM | POA: Diagnosis not present

## 2018-07-29 DIAGNOSIS — N289 Disorder of kidney and ureter, unspecified: Secondary | ICD-10-CM | POA: Diagnosis not present

## 2018-07-30 DIAGNOSIS — I509 Heart failure, unspecified: Secondary | ICD-10-CM | POA: Diagnosis not present

## 2018-07-30 DIAGNOSIS — I872 Venous insufficiency (chronic) (peripheral): Secondary | ICD-10-CM | POA: Diagnosis not present

## 2018-07-30 DIAGNOSIS — L97212 Non-pressure chronic ulcer of right calf with fat layer exposed: Secondary | ICD-10-CM | POA: Diagnosis not present

## 2018-07-30 DIAGNOSIS — N183 Chronic kidney disease, stage 3 (moderate): Secondary | ICD-10-CM | POA: Diagnosis not present

## 2018-07-30 DIAGNOSIS — I130 Hypertensive heart and chronic kidney disease with heart failure and stage 1 through stage 4 chronic kidney disease, or unspecified chronic kidney disease: Secondary | ICD-10-CM | POA: Diagnosis not present

## 2018-07-30 DIAGNOSIS — N390 Urinary tract infection, site not specified: Secondary | ICD-10-CM | POA: Diagnosis not present

## 2018-07-31 DIAGNOSIS — N289 Disorder of kidney and ureter, unspecified: Secondary | ICD-10-CM | POA: Diagnosis not present

## 2018-07-31 DIAGNOSIS — I509 Heart failure, unspecified: Secondary | ICD-10-CM | POA: Diagnosis not present

## 2018-07-31 DIAGNOSIS — I10 Essential (primary) hypertension: Secondary | ICD-10-CM | POA: Diagnosis not present

## 2018-07-31 DIAGNOSIS — R609 Edema, unspecified: Secondary | ICD-10-CM | POA: Diagnosis not present

## 2018-08-02 ENCOUNTER — Non-Acute Institutional Stay: Payer: Medicare Other | Admitting: Internal Medicine

## 2018-08-02 ENCOUNTER — Encounter: Payer: Self-pay | Admitting: Internal Medicine

## 2018-08-02 VITALS — BP 100/60 | HR 72 | Resp 20 | Wt 115.8 lb

## 2018-08-02 DIAGNOSIS — I872 Venous insufficiency (chronic) (peripheral): Secondary | ICD-10-CM | POA: Diagnosis not present

## 2018-08-02 DIAGNOSIS — I509 Heart failure, unspecified: Secondary | ICD-10-CM | POA: Diagnosis not present

## 2018-08-02 DIAGNOSIS — I130 Hypertensive heart and chronic kidney disease with heart failure and stage 1 through stage 4 chronic kidney disease, or unspecified chronic kidney disease: Secondary | ICD-10-CM | POA: Diagnosis not present

## 2018-08-02 DIAGNOSIS — S81801S Unspecified open wound, right lower leg, sequela: Secondary | ICD-10-CM | POA: Diagnosis not present

## 2018-08-02 DIAGNOSIS — F0390 Unspecified dementia without behavioral disturbance: Secondary | ICD-10-CM

## 2018-08-02 DIAGNOSIS — N390 Urinary tract infection, site not specified: Secondary | ICD-10-CM | POA: Diagnosis not present

## 2018-08-02 DIAGNOSIS — L97212 Non-pressure chronic ulcer of right calf with fat layer exposed: Secondary | ICD-10-CM | POA: Diagnosis not present

## 2018-08-02 DIAGNOSIS — N183 Chronic kidney disease, stage 3 (moderate): Secondary | ICD-10-CM | POA: Diagnosis not present

## 2018-08-02 NOTE — Progress Notes (Signed)
Community Palliative Care Telephone: [phone] Fax: [phone]  PATIENT NAME: Katherine Kirby DOB: 08-28-1922 MRN: [id number]  PRIMARY CARE PROVIDER:   Lewis Kirby, Katherine R, MD  Dr. Delrae RendJagadeesh Kirby (Cardiology)  REFERRING PROVIDER:  Lewis Kirby, Katherine R, MD [address] [place] [id number]  RESPONSIBLE PARTY:   D-I-L Katherine Kirby [phone]  [phone] (cell), son Katherine Kirby  HPI:  Katherine Kirby is a 84 y/o female with h/o CHF, CKD, HTN, frequent UTI's, dementia, anemia, and COPD. She was recently evaluated for Hospice eligibility, but felt not to be a candidate as prognosis felt to be greater than 6 month. Referred to community palliative care to assist with symptom management and ongoing discussions regarding goals of care.  RECOMMENDATIONS and PLAN:  1. Dementia: PPS is 40%. Patient needs assist with hygiene and dressing. She is incontinent of bowel and bladder.She is constantly confused, and is oriented to herself only. She is able to recognize family members but sometimes forgets her son's name. She is able to transfer independently and ambulate independently with her rollator to the dining hall. No recent falls. She has dyspnea if hurrying or going up an incline. She has frequent UTIs, and seems to always be battling skin wounds. Currently right posterior shin LE.  2. Left posterior lower extremity wound 1 x 1/2 inches with yellowish drainage. No surrounding redness or inflammation. Seen by PCP 2 days earlier and thought to be improving. [facility]   3. H/O CHF: BP marginal at 100 systolic, but no symptoms of hypotension. Her medications have been adjusted by PCP to balance  renal function with volume overload. She appears fairly compensated today by exam.   4. Goals of care: To maintain function as best she can, at present AL facility.  5. Advanced Care Directives: DNR/MOST form on chart.  6. F/U: NP visit in 1-2 months. Assess for functional decline and  future hospice eligibility. We discussed eligibility criteria.  I spent 90 minutes providing this consultation,  from 1pm to 2:30pm. More than 50% of the time in this consultation was spent coordinating communication.   HOSPICE ELIGIBILITY/DIAGNOSIS: no/prognosis greater than 6 months  PAST MEDICAL HISTORY:  Past Medical History:  Diagnosis Date  . Anemia   . CKD (chronic kidney disease) stage 3, GFR 30-59 ml/min (HCC)   . Dementia (HCC)   . Hypertension   . Lumbar compression fracture (HCC)   . Pelvis fracture (HCC)   . Thoracic compression fracture [facility](HCC)     SOCIAL HX:  Social History   Tobacco Use  . Smoking status: Never Smoker  . Smokeless tobacco: Never Used  Substance Use Topics  . Alcohol use: Not Currently    Comment: ocasional    ALLERGIES: No Known Allergies   PERTINENT MEDICATIONS:  Outpatient Encounter Medications as of 08/02/2018  Medication Sig  . acetaminophen (TYLENOL) 325 MG tablet Take 650 mg by mouth every 6 (six) hours as needed for mild pain.  Marland Kitchen. albuterol (PROVENTIL) (2.5 MG/3ML) 0.083% nebulizer solution Take 2.5 mg by nebulization every 4 (four) hours as needed for wheezing or shortness of breath.   Marland Kitchen. aspirin 81 MG chewable tablet Chew 81 mg by mouth daily.  . bisacodyl (DULCOLAX) 5 MG EC tablet Take 2 tablets (10 mg total) by mouth daily as needed for moderate constipation.  . calcitonin, salmon, (MIACALCIN/FORTICAL) 200 UNIT/ACT nasal spray Place 1 spray into alternate nostrils See admin instructions. Spray 1 spray into Right nostril every other day then  alternating with left nostril  . Calcium Carb-Cholecalciferol (CALCIUM 600+D3 PO) Take 1 tablet by mouth daily with breakfast.  . Cyanocobalamin (VITAMIN B 12 PO) Take 100 mcg by mouth daily.   Marland Kitchen estradiol (ESTRACE) 0.1 MG/GM vaginal cream Place 1 g vaginally daily.  . furosemide (LASIX) 20 MG tablet Take 20 mg by mouth 2 (two) times daily. 1 tab (20mg )  every morning, 1/2 tab (10mg ) every evening.   Marland Kitchen ipratropium (ATROVENT) 0.02 % nebulizer solution Take 0.5 mg by nebulization 2 (two) times daily as needed for wheezing or shortness of breath.  . metoprolol succinate (TOPROL-XL) 50 MG 24 hr tablet Take 1 tablet (50 mg total) by mouth daily. Take with or immediately following a meal. (Patient taking differently: Take 50 mg by mouth See admin instructions. Take two  tablets (100mg ) by mouth every morning.)  . Neomy-Bacit-Polymyx-Pramoxine (QC TRIPLE ANTIBIOTIC EX) Apply 1 application topically 2 (two) times daily. Wash wound on right lower leg with warm soapy water and pat dry twice daily. Cover with non adherent guaze.  . nystatin cream (MYCOSTATIN) Apply 1 application topically See admin instructions. Apply to affected areas every 12 hours as needed for diaper rash.  . polyethylene glycol (MIRALAX) packet Take 17 g by mouth daily as needed for moderate constipation (over the counter). (Patient taking differently: Take 17 g by mouth daily as needed for moderate constipation. )  . Skin Protectants, Misc. (BAZA PROTECT EX) Apply 1 application topically as needed (apply to buttocks wit each diaper change as needed).  . [DISCONTINUED] hydrALAZINE (APRESOLINE) 25 MG tablet Take 1 tablet (25 mg total) by mouth every 8 (eight) hours. (Patient not taking: Reported on 06/27/2018)  . [DISCONTINUED] nitrofurantoin (MACRODANTIN) 100 MG capsule Take 100 mg by mouth daily. Take with food or milk  . [DISCONTINUED] olmesartan (BENICAR) 40 MG tablet Take 40 mg by mouth daily.   No facility-administered encounter medications on file as of 08/02/2018.     PHYSICAL EXAM:  VS 100/60, HR 62, RR 20 General: NAD, frail appearing, slender Cardiovascular: regular rate and rhythm, soft systolic murmur. No significant JVD. Pulmonary: scant bibasilar inspiratory crackles; poorly localized soft expiratory wheezes Abdomen: soft, nontender, + bowel sounds Extremities: softly pitting bilateral edema to mid calf  Skin: RLE  posterior calf 1" x 0.5" wound with yellowing discharge. No surrounding areas inflammation or redness. Neurological: Weakness but otherwise nonfocal. A & O to self only  Anselm Lis, NP

## 2018-08-05 DIAGNOSIS — I10 Essential (primary) hypertension: Secondary | ICD-10-CM | POA: Diagnosis not present

## 2018-08-05 DIAGNOSIS — I340 Nonrheumatic mitral (valve) insufficiency: Secondary | ICD-10-CM | POA: Diagnosis not present

## 2018-08-05 DIAGNOSIS — I5031 Acute diastolic (congestive) heart failure: Secondary | ICD-10-CM | POA: Diagnosis not present

## 2018-08-05 DIAGNOSIS — Q248 Other specified congenital malformations of heart: Secondary | ICD-10-CM | POA: Diagnosis not present

## 2018-08-06 ENCOUNTER — Encounter (HOSPITAL_COMMUNITY): Payer: Self-pay | Admitting: *Deleted

## 2018-08-06 ENCOUNTER — Emergency Department (HOSPITAL_COMMUNITY)
Admission: EM | Admit: 2018-08-06 | Discharge: 2018-08-06 | Disposition: A | Discharge status: Home / Self Care | Payer: Medicare Other | Attending: Emergency Medicine | Admitting: Emergency Medicine

## 2018-08-06 ENCOUNTER — Other Ambulatory Visit: Payer: Self-pay

## 2018-08-06 DIAGNOSIS — Z743 Need for continuous supervision: Secondary | ICD-10-CM | POA: Diagnosis not present

## 2018-08-06 DIAGNOSIS — R456 Violent behavior: Secondary | ICD-10-CM | POA: Insufficient documentation

## 2018-08-06 DIAGNOSIS — Z7982 Long term (current) use of aspirin: Secondary | ICD-10-CM | POA: Diagnosis not present

## 2018-08-06 DIAGNOSIS — I872 Venous insufficiency (chronic) (peripheral): Secondary | ICD-10-CM | POA: Diagnosis not present

## 2018-08-06 DIAGNOSIS — F0390 Unspecified dementia without behavioral disturbance: Secondary | ICD-10-CM | POA: Insufficient documentation

## 2018-08-06 DIAGNOSIS — Z79899 Other long term (current) drug therapy: Secondary | ICD-10-CM | POA: Diagnosis not present

## 2018-08-06 DIAGNOSIS — R404 Transient alteration of awareness: Secondary | ICD-10-CM | POA: Diagnosis not present

## 2018-08-06 DIAGNOSIS — R4182 Altered mental status, unspecified: Secondary | ICD-10-CM | POA: Diagnosis not present

## 2018-08-06 DIAGNOSIS — I130 Hypertensive heart and chronic kidney disease with heart failure and stage 1 through stage 4 chronic kidney disease, or unspecified chronic kidney disease: Secondary | ICD-10-CM | POA: Diagnosis not present

## 2018-08-06 DIAGNOSIS — N390 Urinary tract infection, site not specified: Secondary | ICD-10-CM | POA: Diagnosis not present

## 2018-08-06 DIAGNOSIS — I509 Heart failure, unspecified: Secondary | ICD-10-CM | POA: Diagnosis not present

## 2018-08-06 DIAGNOSIS — R279 Unspecified lack of coordination: Secondary | ICD-10-CM | POA: Diagnosis not present

## 2018-08-06 DIAGNOSIS — L97212 Non-pressure chronic ulcer of right calf with fat layer exposed: Secondary | ICD-10-CM | POA: Diagnosis not present

## 2018-08-06 DIAGNOSIS — N183 Chronic kidney disease, stage 3 (moderate): Secondary | ICD-10-CM | POA: Diagnosis not present

## 2018-08-06 DIAGNOSIS — Z96642 Presence of left artificial hip joint: Secondary | ICD-10-CM | POA: Diagnosis not present

## 2018-08-06 DIAGNOSIS — I129 Hypertensive chronic kidney disease with stage 1 through stage 4 chronic kidney disease, or unspecified chronic kidney disease: Secondary | ICD-10-CM | POA: Diagnosis not present

## 2018-08-06 DIAGNOSIS — R0902 Hypoxemia: Secondary | ICD-10-CM | POA: Diagnosis not present

## 2018-08-06 DIAGNOSIS — I10 Essential (primary) hypertension: Secondary | ICD-10-CM | POA: Diagnosis not present

## 2018-08-06 LAB — URINALYSIS, ROUTINE W REFLEX MICROSCOPIC
BILIRUBIN URINE: NEGATIVE
Bacteria, UA: NONE SEEN
GLUCOSE, UA: NEGATIVE mg/dL
KETONES UR: NEGATIVE mg/dL
NITRITE: NEGATIVE
Protein, ur: NEGATIVE mg/dL
Specific Gravity, Urine: 1.009 (ref 1.005–1.030)
WBC, UA: >50 WBC/hpf — ABNORMAL HIGH (ref 0–5)
pH: 5 (ref 5.0–8.0)

## 2018-08-06 LAB — CBC WITH DIFFERENTIAL/PLATELET
ABS IMMATURE GRANULOCYTES: 0.04 10*3/uL (ref 0.00–0.07)
BASOS PCT: 0 %
Basophils Absolute: 0 10*3/uL (ref 0.0–0.1)
Eosinophils Absolute: 0.1 10*3/uL (ref 0.0–0.5)
Eosinophils Relative: 3 %
HEMATOCRIT: 27.4 % — AB (ref 36.0–46.0)
Hemoglobin: 8.4 g/dL — ABNORMAL LOW (ref 12.0–15.0)
IMMATURE GRANULOCYTES: 1 %
LYMPHS ABS: 0.6 10*3/uL — AB (ref 0.7–4.0)
Lymphocytes Relative: 16 %
MCH: 30.2 pg (ref 26.0–34.0)
MCHC: 30.7 g/dL (ref 30.0–36.0)
MCV: 98.6 fL (ref 80.0–100.0)
MONOS PCT: 14 %
Monocytes Absolute: 0.6 10*3/uL (ref 0.1–1.0)
NEUTROS ABS: 2.5 10*3/uL (ref 1.7–7.7)
NEUTROS PCT: 66 %
PLATELETS: 150 10*3/uL (ref 150–400)
RBC: 2.78 MIL/uL — ABNORMAL LOW (ref 3.87–5.11)
RDW: 14.6 % (ref 11.5–15.5)
WBC: 3.9 10*3/uL — ABNORMAL LOW (ref 4.0–10.5)
nRBC: 0 % (ref 0.0–0.2)

## 2018-08-06 LAB — BASIC METABOLIC PANEL
ANION GAP: 11 (ref 5–15)
BUN: 41 mg/dL — ABNORMAL HIGH (ref 8–23)
CO2: 21 mmol/L — ABNORMAL LOW (ref 22–32)
Calcium: 9.1 mg/dL (ref 8.9–10.3)
Chloride: 104 mmol/L (ref 98–111)
Creatinine, Ser: 1.84 mg/dL — ABNORMAL HIGH (ref 0.44–1.00)
GFR calc Af Amer: 27 mL/min — ABNORMAL LOW (ref >60)
GFR, EST NON AFRICAN AMERICAN: 23 mL/min — AB (ref >60)
GLUCOSE: 99 mg/dL (ref 70–99)
POTASSIUM: 3.8 mmol/L (ref 3.5–5.1)
Sodium: 136 mmol/L (ref 135–145)

## 2018-08-06 MED ORDER — CIPROFLOXACIN HCL 500 MG PO TABS: 500.0000 mg | ORAL_TABLET | Freq: Two times a day (BID) | ORAL | 0 refills | Status: DC

## 2018-08-06 MED ORDER — CIPROFLOXACIN HCL 500 MG PO TABS
500.0000 mg | ORAL_TABLET | Freq: Once | ORAL | Status: AC
  Administered 2018-08-06: 500 mg via ORAL
  Filled 2018-08-06: qty 1

## 2018-08-06 NOTE — ED Notes (Signed)
[facility] CALLED  °

## 2018-08-06 NOTE — ED Provider Notes (Signed)
[name] EMERGENCY DEPARTMENT Provider Note   CSN: [id number] Arrival date & time: 08/06/18  0335     History   Chief Complaint Chief Complaint  Patient presents with  . Altered Mental Status    HPI Katherine Kirby is a 84 y.o. female.  Patient sent to the emergency department for evaluation of altered mental status.  Staff apparently tried to wake her up to go to the bathroom and she was difficult to arouse and then became combative.  During transport by EMS she has been resistant to opening her eyes and will not answer them. Level V Caveat due to dementia.     Past Medical History:  Diagnosis Date  . Anemia   . CKD (chronic kidney disease) stage 3, GFR 30-59 ml/min (HCC)   . Dementia (HCC)   . Hypertension   . Lumbar compression fracture (HCC)   . Pelvis fracture (HCC)   . Thoracic compression fracture [facility])     Patient Active Problem List   Diagnosis Date Noted  . Acute encephalopathy 04/03/2018  . Acute metabolic encephalopathy 11/03/2017  . Urinary tract infection 11/03/2017  . Constipation 11/03/2017  . Anemia 11/03/2017  . CKD (chronic kidney disease) stage 3, GFR 30-59 ml/min (HCC) 11/03/2017  . Thoracic compression fracture (HCC) 11/03/2017  . High blood pressure 11/03/2017  . Mild dementia (HCC) 11/03/2017  . Abnormal urinalysis 09/17/2017  . Respiratory insufficiency 09/17/2017  . HTN (hypertension) 09/17/2017  . Acute kidney injury (HCC) 09/17/2017  . Normocytic anemia 09/17/2017  . HCAP (healthcare-associated pneumonia) 09/17/2017  . Compression fracture of lumbar spine, non-traumatic (HCC) 10/18/2015  . Lumbar compression fracture (HCC) 10/18/2015  . Spinal stenosis 10/18/2015  . Difficulty walking 10/18/2015  . Acute confusional state of infective origin 10/18/2015  . AKI (acute kidney injury) (HCC) 10/18/2015  . Hypertension 10/18/2015  . Hip pain   . Essential hypertension     Past Surgical History:  Procedure  Laterality Date  . TONSILLECTOMY    . TOTAL HIP ARTHROPLASTY Left 2007     OB History   None      Home Medications    Prior to Admission medications   Medication Sig Start Date End Date Taking? Authorizing Provider  acetaminophen (TYLENOL) 325 MG tablet Take 650 mg by mouth every 6 (six) hours as needed for mild pain.   Yes [provider]  albuterol (PROVENTIL) (2.5 MG/3ML) 0.083% nebulizer solution Take 2.5 mg by nebulization every 4 (four) hours as needed for wheezing or shortness of breath.    Yes [provider]  aspirin EC 81 MG tablet Take 81 mg by mouth daily.   Yes [provider]  bisacodyl (DULCOLAX) 5 MG EC tablet Take 2 tablets (10 mg total) by mouth daily as needed for moderate constipation. 09/24/17  Yes Regalado, Belkys A, MD  calcitonin, salmon, (MIACALCIN/FORTICAL) 200 UNIT/ACT nasal spray Place 1 spray into alternate nostrils See admin instructions. Spray 1 spray into Right nostril every other day then alternating with left nostril   Yes [provider]  Calcium Carb-Cholecalciferol (CALCIUM 600+D3 PO) Take 1 tablet by mouth daily with breakfast.   Yes [provider]  Cyanocobalamin (VITAMIN B 12 PO) Take 100 mcg by mouth daily.    Yes [provider]  estradiol (ESTRACE) 0.1 MG/GM vaginal cream Place 1 g vaginally daily.   Yes [provider]  furosemide (LASIX) 20 MG tablet Take 20 mg by mouth 2 (two) times daily.  06/13/18  Yes  [provider]  furosemide (LASIX) 20 MG tablet Take 20 mg by mouth daily as needed for edema.   Yes [provider]  ipratropium (ATROVENT) 0.02 % nebulizer solution Take 0.5 mg by nebulization 2 (two) times daily as needed for wheezing or shortness of breath.   Yes [provider]  metoprolol succinate (TOPROL-XL) 50 MG 24 hr tablet Take 1 tablet (50 mg total) by mouth daily. Take with or immediately following a meal. Patient taking differently: Take 50  mg by mouth See admin instructions. Take two  tablets (100mg ) by mouth every morning. 09/25/17  Yes Regalado, Belkys A, MD  Neomy-Bacit-Polymyx-Pramoxine (QC TRIPLE ANTIBIOTIC EX) Apply 1 application topically daily. To little toe nail bed   Yes [provider]  nystatin cream (MYCOSTATIN) Apply 1 application topically See admin instructions. Apply to affected areas every 12 hours as needed for diaper rash.   Yes [provider]  polyethylene glycol (MIRALAX) packet Take 17 g by mouth daily as needed for moderate constipation (over the counter). Patient taking differently: Take 17 g by mouth daily as needed for moderate constipation.  11/05/17  Yes Rai, Ripudeep K, MD  povidone-iodine (BETADINE) 10 % external solution Apply 1 application topically See admin instructions. To little toe before applying ointment   Yes [provider]  Skin Protectants, Misc. (BAZA PROTECT EX) Apply 1 application topically as needed (apply to buttocks wit each diaper change as needed).   Yes [provider]  sulfamethoxazole-trimethoprim (BACTRIM DS,SEPTRA DS) 800-160 MG tablet Take 1 tablet by mouth 3 (three) times a week.   Yes [provider]    Family History No family history on file.  Social History Social History   Tobacco Use  . Smoking status: Never Smoker  . Smokeless tobacco: Never Used  Substance Use Topics  . Alcohol use: Not Currently    Comment: ocasional  . Drug use: No     Allergies   Patient has no known allergies.   Review of Systems Review of Systems  Unable to perform ROS: Dementia     Physical Exam Updated Vital Signs BP (!) 153/45 (BP Location: Left Arm)   Pulse (!) 59   Temp (!) 97.4 F (36.3 C) (Axillary)   Resp 18   Ht 5' (1.524 m)   Wt 52 kg   SpO2 99%   BMI 22.39 kg/m   Physical Exam  Constitutional: She appears well-developed and well-nourished. No distress.  HENT:  Head: Normocephalic and atraumatic.  Right Ear:  Hearing normal.  Left Ear: Hearing normal.  Nose: Nose normal.  Mouth/Throat: Oropharynx is clear and moist and mucous membranes are normal.  Eyes: Pupils are equal, round, and reactive to light. Conjunctivae and EOM are normal.  Neck: Normal range of motion. Neck supple.  Cardiovascular: Regular rhythm, S1 normal and S2 normal. Exam reveals no gallop and no friction rub.  No murmur heard. Pulmonary/Chest: Effort normal and breath sounds normal. No respiratory distress. She exhibits no tenderness.  Abdominal: Soft. Normal appearance and bowel sounds are normal. There is no hepatosplenomegaly. There is no tenderness. There is no rebound, no guarding, no tenderness at McBurney's point and negative Murphy's sign. No hernia.  Musculoskeletal: Normal range of motion.  Neurological: She is alert. She has normal strength. She is disoriented. No cranial nerve deficit or sensory deficit. Coordination normal. GCS eye subscore is 4. GCS verbal subscore is 5. GCS motor subscore is 6.  Skin: Skin is warm, dry and intact. No  rash noted. No cyanosis.  Psychiatric: She has a normal mood and affect. Her speech is normal and behavior is normal. Thought content normal.  Nursing note and vitals reviewed.    ED Treatments / Results  Labs (all labs ordered are listed, but only abnormal results are displayed) Labs Reviewed  CBC WITH DIFFERENTIAL/PLATELET - Abnormal; Notable for the following components:      Result Value   WBC 3.9 (*)    RBC 2.78 (*)    Hemoglobin 8.4 (*)    HCT 27.4 (*)    Lymphs Abs 0.6 (*)    All other components within normal limits  BASIC METABOLIC PANEL - Abnormal; Notable for the following components:   CO2 21 (*)    BUN 41 (*)    Creatinine, Ser 1.84 (*)    GFR calc non Af Amer 23 (*)    GFR calc Af Amer 27 (*)    All other components within normal limits  URINALYSIS, ROUTINE W REFLEX MICROSCOPIC - Abnormal; Notable for the following components:   APPearance TURBID (*)    Hgb  urine dipstick SMALL (*)    Leukocytes, UA LARGE (*)    WBC, UA >50 (*)    All other components within normal limits  URINE CULTURE    EKG None  Radiology No results found.  Procedures Procedures (including critical care time)  Medications Ordered in ED Medications  ciprofloxacin (CIPRO) tablet 500 mg (has no administration in time range)     Initial Impression / Assessment and Plan / ED Course  I have reviewed the triage vital signs and the nursing notes.  Pertinent labs & imaging results that were available during my care of the patient were reviewed by me and considered in my medical decision making (see chart for details).     At arrival to the ER patient is awake and alert.  She is confused at her baseline.  Family is at bedside.  She has been interacting with them.  She does get agitated when examined, has struck out several times at staff.  She is redirectable.  Work-up does show evidence of infection.  Reviewing her previous urine cultures, she has had Enterobacter and Klebsiella, both sensitive to Cipro.  Patient does not appear septic at this time.  She will be initiated on Cipro, follow urine culture.  Final Clinical Impressions(s) / ED Diagnoses   Final diagnoses:  Urinary tract infection without hematuria, site unspecified    ED Discharge Orders    None       [name], MD 08/06/18 [phone]

## 2018-08-06 NOTE — ED Triage Notes (Signed)
Patient presents to ed via [facility] states staff at [facility] woke patient up to go to the bathroom and patient was combative and wouldn't wake up, ems states patient would occ. Open up her eyes however would not verbally respond to them. Upon arrival patient is alert however will not response to me, family at bedside. States patient usually acts this way when she has a UTI

## 2018-08-08 LAB — URINE CULTURE: Culture: >=100000 — AB

## 2018-08-09 ENCOUNTER — Telehealth: Payer: Self-pay | Admitting: *Deleted

## 2018-08-09 DIAGNOSIS — N390 Urinary tract infection, site not specified: Secondary | ICD-10-CM | POA: Diagnosis not present

## 2018-08-09 DIAGNOSIS — N183 Chronic kidney disease, stage 3 (moderate): Secondary | ICD-10-CM | POA: Diagnosis not present

## 2018-08-09 DIAGNOSIS — L97212 Non-pressure chronic ulcer of right calf with fat layer exposed: Secondary | ICD-10-CM | POA: Diagnosis not present

## 2018-08-09 DIAGNOSIS — I872 Venous insufficiency (chronic) (peripheral): Secondary | ICD-10-CM | POA: Diagnosis not present

## 2018-08-09 DIAGNOSIS — I509 Heart failure, unspecified: Secondary | ICD-10-CM | POA: Diagnosis not present

## 2018-08-09 DIAGNOSIS — I130 Hypertensive heart and chronic kidney disease with heart failure and stage 1 through stage 4 chronic kidney disease, or unspecified chronic kidney disease: Secondary | ICD-10-CM | POA: Diagnosis not present

## 2018-08-09 NOTE — Telephone Encounter (Signed)
Post ED Visit - Positive Culture Follow-up  Culture report reviewed by antimicrobial stewardship pharmacist:  []  Enzo BiNathan Batchelder, Pharm.D. []  Celedonio MiyamotoJeremy Frens, Pharm.D., BCPS AQ-ID []  Garvin FilaMike Maccia, Pharm.D., BCPS []  Georgina PillionElizabeth Martin, [address]Pharm.D., BCPS []  ThermalitoMinh Pham, [place]arm.D., BCPS, AAHIVP []  Estella HuskMichelle Turner, Pharm.D., BCPS, AAHIVP []  Lysle Pearlachel Rumbarger, PharmD, BCPS []  Phillips Climeshuy Dang, PharmD, BCPS [x]  Agapito GamesAlison Masters, PharmD, BCPS []  Verlan FriendsErin Deja, PharmD  Positive urine culture Treated with Ciprofloxacin , organism sensitive to the same and no further patient follow-up is required at this time.  Virl AxeRobertson, Kaiden Pech [facility] 08/09/2018, 8:43 AM

## 2018-08-12 DIAGNOSIS — D649 Anemia, unspecified: Secondary | ICD-10-CM | POA: Diagnosis not present

## 2018-08-13 DIAGNOSIS — I872 Venous insufficiency (chronic) (peripheral): Secondary | ICD-10-CM | POA: Diagnosis not present

## 2018-08-13 DIAGNOSIS — N390 Urinary tract infection, site not specified: Secondary | ICD-10-CM | POA: Diagnosis not present

## 2018-08-13 DIAGNOSIS — L97212 Non-pressure chronic ulcer of right calf with fat layer exposed: Secondary | ICD-10-CM | POA: Diagnosis not present

## 2018-08-13 DIAGNOSIS — N183 Chronic kidney disease, stage 3 (moderate): Secondary | ICD-10-CM | POA: Diagnosis not present

## 2018-08-13 DIAGNOSIS — I130 Hypertensive heart and chronic kidney disease with heart failure and stage 1 through stage 4 chronic kidney disease, or unspecified chronic kidney disease: Secondary | ICD-10-CM | POA: Diagnosis not present

## 2018-08-13 DIAGNOSIS — I509 Heart failure, unspecified: Secondary | ICD-10-CM | POA: Diagnosis not present

## 2018-08-14 DIAGNOSIS — Z6822 Body mass index (BMI) 22.0-22.9, adult: Secondary | ICD-10-CM | POA: Diagnosis not present

## 2018-08-14 DIAGNOSIS — I10 Essential (primary) hypertension: Secondary | ICD-10-CM | POA: Diagnosis not present

## 2018-08-14 DIAGNOSIS — I509 Heart failure, unspecified: Secondary | ICD-10-CM | POA: Diagnosis not present

## 2018-08-14 DIAGNOSIS — N390 Urinary tract infection, site not specified: Secondary | ICD-10-CM | POA: Diagnosis not present

## 2018-08-14 DIAGNOSIS — D649 Anemia, unspecified: Secondary | ICD-10-CM | POA: Diagnosis not present

## 2018-08-16 DIAGNOSIS — N183 Chronic kidney disease, stage 3 (moderate): Secondary | ICD-10-CM | POA: Diagnosis not present

## 2018-08-16 DIAGNOSIS — I872 Venous insufficiency (chronic) (peripheral): Secondary | ICD-10-CM | POA: Diagnosis not present

## 2018-08-16 DIAGNOSIS — L97212 Non-pressure chronic ulcer of right calf with fat layer exposed: Secondary | ICD-10-CM | POA: Diagnosis not present

## 2018-08-16 DIAGNOSIS — I130 Hypertensive heart and chronic kidney disease with heart failure and stage 1 through stage 4 chronic kidney disease, or unspecified chronic kidney disease: Secondary | ICD-10-CM | POA: Diagnosis not present

## 2018-08-16 DIAGNOSIS — N390 Urinary tract infection, site not specified: Secondary | ICD-10-CM | POA: Diagnosis not present

## 2018-08-16 DIAGNOSIS — I509 Heart failure, unspecified: Secondary | ICD-10-CM | POA: Diagnosis not present

## 2018-08-20 DIAGNOSIS — N183 Chronic kidney disease, stage 3 (moderate): Secondary | ICD-10-CM | POA: Diagnosis not present

## 2018-08-20 DIAGNOSIS — I872 Venous insufficiency (chronic) (peripheral): Secondary | ICD-10-CM | POA: Diagnosis not present

## 2018-08-20 DIAGNOSIS — L97212 Non-pressure chronic ulcer of right calf with fat layer exposed: Secondary | ICD-10-CM | POA: Diagnosis not present

## 2018-08-20 DIAGNOSIS — I130 Hypertensive heart and chronic kidney disease with heart failure and stage 1 through stage 4 chronic kidney disease, or unspecified chronic kidney disease: Secondary | ICD-10-CM | POA: Diagnosis not present

## 2018-08-20 DIAGNOSIS — N390 Urinary tract infection, site not specified: Secondary | ICD-10-CM | POA: Diagnosis not present

## 2018-08-20 DIAGNOSIS — I509 Heart failure, unspecified: Secondary | ICD-10-CM | POA: Diagnosis not present

## 2018-08-23 DIAGNOSIS — L97212 Non-pressure chronic ulcer of right calf with fat layer exposed: Secondary | ICD-10-CM | POA: Diagnosis not present

## 2018-08-23 DIAGNOSIS — I872 Venous insufficiency (chronic) (peripheral): Secondary | ICD-10-CM | POA: Diagnosis not present

## 2018-08-23 DIAGNOSIS — N183 Chronic kidney disease, stage 3 (moderate): Secondary | ICD-10-CM | POA: Diagnosis not present

## 2018-08-23 DIAGNOSIS — I509 Heart failure, unspecified: Secondary | ICD-10-CM | POA: Diagnosis not present

## 2018-08-23 DIAGNOSIS — N390 Urinary tract infection, site not specified: Secondary | ICD-10-CM | POA: Diagnosis not present

## 2018-08-23 DIAGNOSIS — I130 Hypertensive heart and chronic kidney disease with heart failure and stage 1 through stage 4 chronic kidney disease, or unspecified chronic kidney disease: Secondary | ICD-10-CM | POA: Diagnosis not present

## 2018-08-26 DIAGNOSIS — N390 Urinary tract infection, site not specified: Secondary | ICD-10-CM | POA: Diagnosis not present

## 2018-08-26 DIAGNOSIS — N183 Chronic kidney disease, stage 3 (moderate): Secondary | ICD-10-CM | POA: Diagnosis not present

## 2018-08-26 DIAGNOSIS — I509 Heart failure, unspecified: Secondary | ICD-10-CM | POA: Diagnosis not present

## 2018-08-26 DIAGNOSIS — I130 Hypertensive heart and chronic kidney disease with heart failure and stage 1 through stage 4 chronic kidney disease, or unspecified chronic kidney disease: Secondary | ICD-10-CM | POA: Diagnosis not present

## 2018-08-26 DIAGNOSIS — L97212 Non-pressure chronic ulcer of right calf with fat layer exposed: Secondary | ICD-10-CM | POA: Diagnosis not present

## 2018-08-26 DIAGNOSIS — I872 Venous insufficiency (chronic) (peripheral): Secondary | ICD-10-CM | POA: Diagnosis not present

## 2018-08-30 DIAGNOSIS — I509 Heart failure, unspecified: Secondary | ICD-10-CM | POA: Diagnosis not present

## 2018-08-30 DIAGNOSIS — N390 Urinary tract infection, site not specified: Secondary | ICD-10-CM | POA: Diagnosis not present

## 2018-08-30 DIAGNOSIS — N183 Chronic kidney disease, stage 3 (moderate): Secondary | ICD-10-CM | POA: Diagnosis not present

## 2018-08-30 DIAGNOSIS — I130 Hypertensive heart and chronic kidney disease with heart failure and stage 1 through stage 4 chronic kidney disease, or unspecified chronic kidney disease: Secondary | ICD-10-CM | POA: Diagnosis not present

## 2018-08-30 DIAGNOSIS — L97212 Non-pressure chronic ulcer of right calf with fat layer exposed: Secondary | ICD-10-CM | POA: Diagnosis not present

## 2018-08-30 DIAGNOSIS — I872 Venous insufficiency (chronic) (peripheral): Secondary | ICD-10-CM | POA: Diagnosis not present

## 2018-09-02 DIAGNOSIS — I509 Heart failure, unspecified: Secondary | ICD-10-CM | POA: Diagnosis not present

## 2018-09-02 DIAGNOSIS — I872 Venous insufficiency (chronic) (peripheral): Secondary | ICD-10-CM | POA: Diagnosis not present

## 2018-09-02 DIAGNOSIS — I130 Hypertensive heart and chronic kidney disease with heart failure and stage 1 through stage 4 chronic kidney disease, or unspecified chronic kidney disease: Secondary | ICD-10-CM | POA: Diagnosis not present

## 2018-09-02 DIAGNOSIS — L97212 Non-pressure chronic ulcer of right calf with fat layer exposed: Secondary | ICD-10-CM | POA: Diagnosis not present

## 2018-09-02 DIAGNOSIS — N183 Chronic kidney disease, stage 3 (moderate): Secondary | ICD-10-CM | POA: Diagnosis not present

## 2018-09-02 DIAGNOSIS — N390 Urinary tract infection, site not specified: Secondary | ICD-10-CM | POA: Diagnosis not present

## 2018-09-06 DIAGNOSIS — I509 Heart failure, unspecified: Secondary | ICD-10-CM | POA: Diagnosis not present

## 2018-09-06 DIAGNOSIS — L97212 Non-pressure chronic ulcer of right calf with fat layer exposed: Secondary | ICD-10-CM | POA: Diagnosis not present

## 2018-09-06 DIAGNOSIS — I872 Venous insufficiency (chronic) (peripheral): Secondary | ICD-10-CM | POA: Diagnosis not present

## 2018-09-06 DIAGNOSIS — N390 Urinary tract infection, site not specified: Secondary | ICD-10-CM | POA: Diagnosis not present

## 2018-09-06 DIAGNOSIS — I130 Hypertensive heart and chronic kidney disease with heart failure and stage 1 through stage 4 chronic kidney disease, or unspecified chronic kidney disease: Secondary | ICD-10-CM | POA: Diagnosis not present

## 2018-09-06 DIAGNOSIS — N183 Chronic kidney disease, stage 3 (moderate): Secondary | ICD-10-CM | POA: Diagnosis not present

## 2018-09-07 DIAGNOSIS — N390 Urinary tract infection, site not specified: Secondary | ICD-10-CM | POA: Diagnosis not present

## 2018-09-10 DIAGNOSIS — D649 Anemia, unspecified: Secondary | ICD-10-CM | POA: Diagnosis not present

## 2018-09-10 DIAGNOSIS — N289 Disorder of kidney and ureter, unspecified: Secondary | ICD-10-CM | POA: Diagnosis not present

## 2018-09-10 DIAGNOSIS — I10 Essential (primary) hypertension: Secondary | ICD-10-CM | POA: Diagnosis not present

## 2018-09-10 DIAGNOSIS — I509 Heart failure, unspecified: Secondary | ICD-10-CM | POA: Diagnosis not present

## 2018-09-11 ENCOUNTER — Emergency Department (HOSPITAL_COMMUNITY)
Admission: EM | Admit: 2018-09-11 | Discharge: 2018-09-11 | Disposition: A | Discharge status: Home / Self Care | Payer: Medicare Other | Attending: Emergency Medicine | Admitting: Emergency Medicine

## 2018-09-11 ENCOUNTER — Encounter (HOSPITAL_COMMUNITY): Payer: Self-pay

## 2018-09-11 ENCOUNTER — Emergency Department (HOSPITAL_COMMUNITY): Payer: Medicare Other

## 2018-09-11 ENCOUNTER — Other Ambulatory Visit: Payer: Self-pay

## 2018-09-11 DIAGNOSIS — M2550 Pain in unspecified joint: Secondary | ICD-10-CM | POA: Diagnosis not present

## 2018-09-11 DIAGNOSIS — I129 Hypertensive chronic kidney disease with stage 1 through stage 4 chronic kidney disease, or unspecified chronic kidney disease: Secondary | ICD-10-CM | POA: Insufficient documentation

## 2018-09-11 DIAGNOSIS — Z96642 Presence of left artificial hip joint: Secondary | ICD-10-CM | POA: Insufficient documentation

## 2018-09-11 DIAGNOSIS — F0390 Unspecified dementia without behavioral disturbance: Secondary | ICD-10-CM | POA: Insufficient documentation

## 2018-09-11 DIAGNOSIS — R0902 Hypoxemia: Secondary | ICD-10-CM | POA: Diagnosis not present

## 2018-09-11 DIAGNOSIS — Z79899 Other long term (current) drug therapy: Secondary | ICD-10-CM | POA: Insufficient documentation

## 2018-09-11 DIAGNOSIS — I10 Essential (primary) hypertension: Secondary | ICD-10-CM | POA: Diagnosis not present

## 2018-09-11 DIAGNOSIS — Z7401 Bed confinement status: Secondary | ICD-10-CM | POA: Diagnosis not present

## 2018-09-11 DIAGNOSIS — Z7982 Long term (current) use of aspirin: Secondary | ICD-10-CM | POA: Diagnosis not present

## 2018-09-11 DIAGNOSIS — R4182 Altered mental status, unspecified: Secondary | ICD-10-CM | POA: Diagnosis not present

## 2018-09-11 DIAGNOSIS — N183 Chronic kidney disease, stage 3 (moderate): Secondary | ICD-10-CM | POA: Diagnosis not present

## 2018-09-11 DIAGNOSIS — I959 Hypotension, unspecified: Secondary | ICD-10-CM | POA: Diagnosis not present

## 2018-09-11 DIAGNOSIS — R404 Transient alteration of awareness: Secondary | ICD-10-CM | POA: Diagnosis not present

## 2018-09-11 DIAGNOSIS — R9431 Abnormal electrocardiogram [ECG] [EKG]: Secondary | ICD-10-CM | POA: Diagnosis not present

## 2018-09-11 DIAGNOSIS — R52 Pain, unspecified: Secondary | ICD-10-CM | POA: Diagnosis not present

## 2018-09-11 DIAGNOSIS — R531 Weakness: Secondary | ICD-10-CM | POA: Diagnosis not present

## 2018-09-11 LAB — CBC WITH DIFFERENTIAL/PLATELET
ABS IMMATURE GRANULOCYTES: 0.04 10*3/uL (ref 0.00–0.07)
BASOS PCT: 0 %
Basophils Absolute: 0 10*3/uL (ref 0.0–0.1)
EOS ABS: 0.1 10*3/uL (ref 0.0–0.5)
Eosinophils Relative: 3 %
HEMATOCRIT: 28.5 % — AB (ref 36.0–46.0)
Hemoglobin: 9 g/dL — ABNORMAL LOW (ref 12.0–15.0)
Immature Granulocytes: 1 %
Lymphocytes Relative: 18 %
Lymphs Abs: 0.7 10*3/uL (ref 0.7–4.0)
MCH: 31.8 pg (ref 26.0–34.0)
MCHC: 31.6 g/dL (ref 30.0–36.0)
MCV: 100.7 fL — ABNORMAL HIGH (ref 80.0–100.0)
MONO ABS: 0.5 10*3/uL (ref 0.1–1.0)
MONOS PCT: 14 %
Neutro Abs: 2.3 10*3/uL (ref 1.7–7.7)
Neutrophils Relative %: 64 %
Platelets: 165 10*3/uL (ref 150–400)
RBC: 2.83 MIL/uL — ABNORMAL LOW (ref 3.87–5.11)
RDW: 14.1 % (ref 11.5–15.5)
WBC: 3.6 10*3/uL — ABNORMAL LOW (ref 4.0–10.5)
nRBC: 0 % (ref 0.0–0.2)

## 2018-09-11 LAB — BASIC METABOLIC PANEL
Anion gap: 7 (ref 5–15)
BUN: 33 mg/dL — AB (ref 8–23)
CO2: 22 mmol/L (ref 22–32)
Calcium: 9.1 mg/dL (ref 8.9–10.3)
Chloride: 109 mmol/L (ref 98–111)
Creatinine, Ser: 1.37 mg/dL — ABNORMAL HIGH (ref 0.44–1.00)
GFR calc Af Amer: 38 mL/min — ABNORMAL LOW (ref >60)
GFR, EST NON AFRICAN AMERICAN: 33 mL/min — AB (ref >60)
GLUCOSE: 98 mg/dL (ref 70–99)
POTASSIUM: 4.2 mmol/L (ref 3.5–5.1)
Sodium: 138 mmol/L (ref 135–145)

## 2018-09-11 LAB — URINALYSIS, ROUTINE W REFLEX MICROSCOPIC
Bilirubin Urine: NEGATIVE
Glucose, UA: NEGATIVE mg/dL
HGB URINE DIPSTICK: NEGATIVE
KETONES UR: NEGATIVE mg/dL
Leukocytes, UA: NEGATIVE
Nitrite: NEGATIVE
PROTEIN: NEGATIVE mg/dL
Specific Gravity, Urine: 1.01 (ref 1.005–1.030)
pH: 5 (ref 5.0–8.0)

## 2018-09-11 LAB — TROPONIN I: Troponin I: <0.03 ng/mL (ref <0.03)

## 2018-09-11 NOTE — ED Notes (Signed)
Transport has been contacted for transport of patient to [facility] [place].

## 2018-09-11 NOTE — ED Notes (Signed)
Bed: WA17 Expected date:  Expected time:  Means of arrival:  Comments: 85 y/o AMS

## 2018-09-11 NOTE — ED Triage Notes (Signed)
Pt brought by [facility] from [facility]. Per nursing home staff, pt has been altered and less responsive. Per EMS, pt responds to pain and spontaneous eye opening. Per nursing home staff, pt's mental status changes when she gets a UTI. Pt has a hx of dementia.

## 2018-09-11 NOTE — ED Notes (Signed)
Patient unable to sign discharge paperwork and unable to verbalize understanding due to mental status. Patient has been transported to [facility] [place].

## 2018-09-11 NOTE — ED Provider Notes (Signed)
[facility]-EMERGENCY DEPT Provider Note: Katherine Kirby Katherine Lanni, MD, FACEP  CSN: [id number] MRN: [id number] ARRIVAL: 09/11/18 at 0410 ROOM: WA17/WA17   CHIEF COMPLAINT  Altered Mental Status  Level 5 caveat: Dementia; altered mental status HISTORY OF PRESENT ILLNESS  09/11/18 4:45 AM Katherine Kirby is a 85 y.o. female with a history of dementia.  She was sent from her nursing home via EMS for altered mental status.  Staff at the nursing home could not state what her baseline mental status is nor in what way she was altered.  EMS reports they said "does not my regular patient".  The patient has been minimally responsive to stimuli but will open her eyes and move her extremities.  She has been nonverbal.   Past Medical History:  Diagnosis Date  . Anemia   . CKD (chronic kidney disease) stage 3, GFR 30-59 ml/min (HCC)   . Dementia (HCC)   . Hypertension   . Lumbar compression fracture (HCC)   . Pelvis fracture (HCC)   . Thoracic compression fracture [facility](HCC)     Past Surgical History:  Procedure Laterality Date  . TONSILLECTOMY    . TOTAL HIP ARTHROPLASTY Left 2007    No family history on file.  Social History   Tobacco Use  . Smoking status: Never Smoker  . Smokeless tobacco: Never Used  Substance Use Topics  . Alcohol use: Not Currently    Comment: ocasional  . Drug use: No    Prior to Admission medications   Medication Sig Start Date End Date Taking? Authorizing Provider  acetaminophen (TYLENOL) 325 MG tablet Take 650 mg by mouth every 6 (six) hours as needed for mild pain.   Yes [provider]  albuterol (PROVENTIL) (2.5 MG/3ML) 0.083% nebulizer solution Take 2.5 mg by nebulization every 4 (four) hours as needed for wheezing or shortness of breath.    Yes [provider]  aspirin EC 81 MG tablet Take 81 mg by mouth daily.   Yes [provider]  bisacodyl (DULCOLAX) 5 MG EC tablet Take 2 tablets (10 mg total) by mouth daily as needed for moderate  constipation. 09/24/17  Yes Regalado, Belkys A, MD  calcitonin, salmon, (MIACALCIN/FORTICAL) 200 UNIT/ACT nasal spray Place 1 spray into alternate nostrils See admin instructions. Spray 1 spray into Right nostril every other day then alternating with left nostril   Yes [provider]  Calcium Carb-Cholecalciferol (CALCIUM 600+D3 PO) Take 1 tablet by mouth daily with breakfast.   Yes [provider]  Cyanocobalamin (VITAMIN B 12 PO) Take 100 mcg by mouth daily.    Yes [provider]  estradiol (ESTRACE) 0.1 MG/GM vaginal cream Place 1 g vaginally daily.   Yes [provider]  furosemide (LASIX) 20 MG tablet Take 10-20 mg by mouth as directed. 10 Mg in the AM and 20 Mg in the PM   Yes [provider]  ipratropium (ATROVENT) 0.02 % nebulizer solution Take 0.5 mg by nebulization 2 (two) times daily as needed for wheezing or shortness of breath.   Yes [provider]  metoprolol succinate (TOPROL-XL) 50 MG 24 hr tablet Take 1 tablet (50 mg total) by mouth daily. Take with or immediately following a meal. Patient taking differently: Take 50 mg by mouth See admin instructions. Take two  tablets (100mg ) by mouth every morning. 09/25/17  Yes Regalado, Belkys A, MD  Neomy-Bacit-Polymyx-Pramoxine (QC TRIPLE ANTIBIOTIC EX) Apply 1 application topically daily. To little toe nail bed   Yes [provider]  nystatin cream (MYCOSTATIN) Apply 1 application topically See admin instructions. Apply to affected areas every 12 hours as needed for diaper rash.   Yes [provider]  polyethylene glycol (MIRALAX) packet Take 17 g by mouth daily as needed for moderate constipation (over the counter). Patient taking differently: Take 17 g by mouth daily as needed for moderate constipation.  11/05/17  Yes Rai, Ripudeep K, MD  povidone-iodine (BETADINE) 10 % external solution Apply 1 application topically See admin instructions. To little toe before applying  ointment   Yes [provider]  Skin Protectants, Misc. (BAZA PROTECT EX) Apply 1 application topically as needed (apply to buttocks wit each diaper change as needed).   Yes [provider]  sulfamethoxazole-trimethoprim (BACTRIM DS,SEPTRA DS) 800-160 MG tablet Take 1 tablet by mouth 3 (three) times a week.   Yes [provider]  ciprofloxacin (CIPRO) 500 MG tablet Take 1 tablet (500 mg total) by mouth 2 (two) times daily. Patient not taking: Reported on 09/11/2018 08/06/18   [name], MD    Allergies Patient has no known allergies.   REVIEW OF SYSTEMS     PHYSICAL EXAMINATION  Initial Vital Signs Blood pressure (!) 147/57, pulse 66, temperature (!) 97.1 F (36.2 C), temperature source Rectal, resp. rate 18, SpO2 99 %.  Examination General: Well-developed, well-nourished female in no acute distress; appearance consistent with age of record HENT: normocephalic; atraumatic Eyes: pupils equal, round and reactive to light; extraocular muscle function cannot be assessed Neck: supple Heart: regular rate and rhythm Lungs: clear to auscultation bilaterally Abdomen: soft; nondistended; nontender; bowel sounds present Extremities: No deformity; pulses normal; no edema Neurologic: Somnolent; no spontaneous eye opening; noted to move all extremities in response to noxious stimuli Skin: Warm and dry    RESULTS  Summary of this visit's results, reviewed by myself:   EKG Interpretation  Date/Time:  Wednesday September 11 2018 05:01:54 EST Ventricular Rate:  64 PR Interval:    QRS Duration: 110 QT Interval:  454 QTC Calculation: 469 R Axis:   -17 Text Interpretation:  Sinus rhythm Probable left atrial enlargement Abnormal R-wave progression, early transition Inferior infarct, old No significant change was found Confirmed by Paula Libra ([id number]) on 09/11/2018 5:07:06 AM      Laboratory Studies: Results for orders placed or performed during the  hospital encounter of 09/11/18 (from the past 24 hour(s))  CBC with Differential/Platelet     Status: Abnormal   Collection Time: 09/11/18  4:49 AM  Result Value Ref Range   WBC 3.6 (L) 4.0 - 10.5 K/uL   RBC 2.83 (L) 3.87 - 5.11 MIL/uL   Hemoglobin 9.0 (L) 12.0 - 15.0 g/dL   HCT 47.0 (L) 96.2 - 83.6 %   MCV 100.7 (H) 80.0 - 100.0 fL   MCH 31.8 26.0 - 34.0 pg   MCHC 31.6 30.0 - 36.0 g/dL   RDW 62.9 47.6 - 54.6 %   Platelets 165 150 - 400 K/uL   nRBC 0.0 0.0 - 0.2 %   Neutrophils Relative % 64 %   Neutro Abs 2.3 1.7 - 7.7 K/uL   Lymphocytes Relative 18 %   Lymphs Abs 0.7 0.7 - 4.0 K/uL   Monocytes Relative 14 %   Monocytes Absolute 0.5 0.1 - 1.0 K/uL   Eosinophils Relative 3 %   Eosinophils Absolute 0.1 0.0 - 0.5 K/uL   Basophils Relative 0 %   Basophils Absolute 0.0 0.0 - 0.1 K/uL   Immature Granulocytes 1 %  Abs Immature Granulocytes 0.04 0.00 - 0.07 K/uL  Basic metabolic panel     Status: Abnormal   Collection Time: 09/11/18  4:49 AM  Result Value Ref Range   Sodium 138 135 - 145 mmol/L   Potassium 4.2 3.5 - 5.1 mmol/L   Chloride 109 98 - 111 mmol/L   CO2 22 22 - 32 mmol/L   Glucose, Bld 98 70 - 99 mg/dL   BUN 33 (H) 8 - 23 mg/dL   Creatinine, Ser 9.44 (H) 0.44 - 1.00 mg/dL   Calcium 9.1 8.9 - 96.7 mg/dL   GFR calc non Af Amer 33 (L) >60 mL/min   GFR calc Af Amer 38 (L) >60 mL/min   Anion gap 7 5 - 15  Troponin I - ONCE - STAT     Status: None   Collection Time: 09/11/18  4:49 AM  Result Value Ref Range   Troponin I <0.03 <0.03 ng/mL  Urinalysis, Routine w reflex microscopic     Status: Abnormal   Collection Time: 09/11/18  5:11 AM  Result Value Ref Range   Color, Urine STRAW (A) YELLOW   APPearance CLEAR CLEAR   Specific Gravity, Urine 1.010 1.005 - 1.030   pH 5.0 5.0 - 8.0   Glucose, UA NEGATIVE NEGATIVE mg/dL   Hgb urine dipstick NEGATIVE NEGATIVE   Bilirubin Urine NEGATIVE NEGATIVE   Ketones, ur NEGATIVE NEGATIVE mg/dL   Protein, ur NEGATIVE NEGATIVE mg/dL    Nitrite NEGATIVE NEGATIVE   Leukocytes, UA NEGATIVE NEGATIVE   Imaging Studies: Ct Head Wo Contrast  Result Date: 09/11/2018 CLINICAL DATA:  Altered mental status.  Dementia. EXAM: CT HEAD WITHOUT CONTRAST TECHNIQUE: Contiguous axial images were obtained from the base of the skull through the vertex without intravenous contrast. COMPARISON:  04/03/18 FINDINGS: Brain: No evidence of acute infarction, hemorrhage, hydrocephalus, extra-axial collection or mass lesion/mass effect. Moderate to advanced atrophy with extensive chronic small vessel ischemia in the cerebral white matter. Vascular: No hyperdense vessel or unexpected calcification. Skull: No acute finding. Craniocervical alignment from head rotation. Retro dental ligamentous thickening Sinuses/Orbits: Chronic partial opacification of the right mastoids with negative nasopharynx. IMPRESSION: 1. No acute finding. 2. Stable atrophy and chronic small vessel disease. Electronically Signed   By: Marnee Spring M.D.   On: 09/11/2018 05:46    ED COURSE and MDM  Nursing notes and initial vitals signs, including pulse oximetry, reviewed.  Vitals:   09/11/18 0412 09/11/18 0436 09/11/18 0653  BP:  (!) 147/57 113/66  Pulse:  66 63  Resp:  18 19  Temp:  (!) 97.1 F (36.2 C)   TempSrc:  Rectal   SpO2: 96% 99% 96%   6:58 AM The patient has known dementia.  It is unclear if her current mental status is any different from her baseline.  Her head CT is showing no acute abnormalities, changes consistent with known dementia.  She does not have a urinary tract infection, is afebrile and does not have a leukocytosis.  She has stable anemia and electrolytes.  EKG and troponin are unremarkable.  At this time it does not appear there is a reason for admission or further work-up.  We will return her back to her nursing home.   PROCEDURES    ED DIAGNOSES     ICD-10-CM   1. Dementia without behavioral disturbance, unspecified dementia type (HCC) F03.90         Maliaka Brasington, Jonny Ruiz, MD 09/11/18 [phone]

## 2018-09-13 DIAGNOSIS — Z6822 Body mass index (BMI) 22.0-22.9, adult: Secondary | ICD-10-CM | POA: Diagnosis not present

## 2018-09-13 DIAGNOSIS — D649 Anemia, unspecified: Secondary | ICD-10-CM | POA: Diagnosis not present

## 2018-09-13 DIAGNOSIS — I509 Heart failure, unspecified: Secondary | ICD-10-CM | POA: Diagnosis not present

## 2018-09-13 DIAGNOSIS — I10 Essential (primary) hypertension: Secondary | ICD-10-CM | POA: Diagnosis not present

## 2018-09-16 DIAGNOSIS — I739 Peripheral vascular disease, unspecified: Secondary | ICD-10-CM | POA: Diagnosis not present

## 2018-09-16 DIAGNOSIS — L603 Nail dystrophy: Secondary | ICD-10-CM | POA: Diagnosis not present

## 2018-09-29 ENCOUNTER — Other Ambulatory Visit
Admission: RE | Admit: 2018-09-29 | Discharge: 2018-09-29 | Disposition: A | Discharge status: Home / Self Care | Payer: Medicare Other | Source: Ambulatory Visit | Attending: Family Medicine | Admitting: Family Medicine

## 2018-09-29 DIAGNOSIS — N289 Disorder of kidney and ureter, unspecified: Secondary | ICD-10-CM | POA: Diagnosis not present

## 2018-09-29 DIAGNOSIS — B351 Tinea unguium: Secondary | ICD-10-CM | POA: Diagnosis not present

## 2018-09-29 DIAGNOSIS — N390 Urinary tract infection, site not specified: Secondary | ICD-10-CM | POA: Diagnosis not present

## 2018-09-29 LAB — URINALYSIS, COMPLETE (UACMP) WITH MICROSCOPIC
Bacteria, UA: NONE SEEN
Bilirubin Urine: NEGATIVE
Glucose, UA: NEGATIVE mg/dL
Hgb urine dipstick: NEGATIVE
Ketones, ur: NEGATIVE mg/dL
Leukocytes, UA: NEGATIVE
NITRITE: NEGATIVE
PH: 5 (ref 5.0–8.0)
PROTEIN: NEGATIVE mg/dL
Specific Gravity, Urine: 1.014 (ref 1.005–1.030)

## 2018-10-04 DIAGNOSIS — R262 Difficulty in walking, not elsewhere classified: Secondary | ICD-10-CM | POA: Diagnosis not present

## 2018-10-07 DIAGNOSIS — R262 Difficulty in walking, not elsewhere classified: Secondary | ICD-10-CM | POA: Diagnosis not present

## 2018-10-09 DIAGNOSIS — R262 Difficulty in walking, not elsewhere classified: Secondary | ICD-10-CM | POA: Diagnosis not present

## 2018-10-11 DIAGNOSIS — R262 Difficulty in walking, not elsewhere classified: Secondary | ICD-10-CM | POA: Diagnosis not present

## 2018-10-14 DIAGNOSIS — R262 Difficulty in walking, not elsewhere classified: Secondary | ICD-10-CM | POA: Diagnosis not present

## 2018-10-17 ENCOUNTER — Non-Acute Institutional Stay: Payer: Medicare Other | Admitting: Internal Medicine

## 2018-10-17 ENCOUNTER — Encounter: Payer: Self-pay | Admitting: Internal Medicine

## 2018-10-17 DIAGNOSIS — I509 Heart failure, unspecified: Secondary | ICD-10-CM | POA: Diagnosis not present

## 2018-10-17 DIAGNOSIS — Z515 Encounter for palliative care: Secondary | ICD-10-CM

## 2018-10-17 DIAGNOSIS — D649 Anemia, unspecified: Secondary | ICD-10-CM | POA: Diagnosis not present

## 2018-10-17 DIAGNOSIS — R262 Difficulty in walking, not elsewhere classified: Secondary | ICD-10-CM | POA: Diagnosis not present

## 2018-10-17 DIAGNOSIS — N289 Disorder of kidney and ureter, unspecified: Secondary | ICD-10-CM | POA: Diagnosis not present

## 2018-10-17 DIAGNOSIS — Z79899 Other long term (current) drug therapy: Secondary | ICD-10-CM | POA: Diagnosis not present

## 2018-10-17 NOTE — Progress Notes (Addendum)
Feb 13th, 2020 [facility] Telephone: [phone] Fax: [phone]  PATIENT NAME: Katherine Kirby DOB: 1921-10-06 MRN: [id number] [facility] [place] [address]  PRIMARY CARE PROVIDER:   Lewis Moccasin, MD  Dr. Delrae Rend (Cardiology)  REFERRING PROVIDER:  Lewis Moccasin, MD [address] [place], [place] [address]  RESPONSIBLE PARTY:   *D-I-L Lurena Joiner [phone] (cell), son Jillyn Hidden  RECOMMENDATIONS and PLAN:  1. Dementia: FAST score 6e/7a. Her PPS is 30%. She needs assist with hygiene, dressing, and cuing for transfers. She needs direction and cuing to eat. She is incontinent of bowel and bladder. She is constantly confused, and is oriented to herself only. She is able to recognize family members. She is able to ambulate with cueing with her rollator to the dining hall, with a slow shuffling gait. Out patient PT is following to hopefully improve gait stability/strength and endurance with use of the walker. I discussed with D-I-L Lurena Joiner that with patient's poor safety awareness that she is at high risk for falls, as patient can't cognate well enough to remember to use her walker should she attempt to get up on her own. Staff report patient with increased napping.  She had frequent UTIs which have improved with a regimen of perineal estrogen cream and prophylactic antibiotics. The slow healing wound right posterior shin is now scabbed over. Her weight on Feb 7th was 112.2lbs, which is down 2.8 lbs over the last 2 months.   2. Goals of care: To maintain function as best she can, at present AL facility.  3. Advanced Care Directives: DNR/MOST form on chart.  4. F/U: NP visit in 1-2 months. Assess for functional decline and future hospice eligibility. I did update D-I-L Lurena Joiner with this visit.  I spent 30 minutes providing this consultation,  from 11am to 11:30am. More than 50% of the time in this consultation was spent  coordinating communication.   HPI:  [name] Kirby is a 85 y/o female with h/o CHF, CKD, HTN, frequent UTI's, dementia, anemia, and COPD. She was recently evaluated for Hospice eligibility, but felt not to be a candidate as prognosis felt to be greater than 6 month. ER visits 08/06/2018 for UTI with associated MS changes, an 01/08.2020 for MS changes (neg w/u). This is a f/u PC visit from 08/02/2018. PC continues to follow to assist with symptom management and ongoing discussions regarding goals of care.  ADVANCED CARE DIRECTIVES: DNR/MOST: DNR/DNI, Scope of Medical Care is limited as to additional interventions. Yes to antibiotics and to IVFs. No feeding tube. Call son Jillyn Hidden before sending resident to the ER.  HOSPICE ELIGIBILITY/DIAGNOSIS: no/prognosis greater than 6 months   PAST MEDICAL HISTORY:  Past Medical History:  Diagnosis Date  . Anemia   . CKD (chronic kidney disease) stage 3, GFR 30-59 ml/min (HCC)   . Dementia (HCC)   . Hypertension   . Lumbar compression fracture (HCC)   . Pelvis fracture (HCC)   . Thoracic compression fracture [facility])     SOCIAL HX:  Social History   Tobacco Use  . Smoking status: Never Smoker  . Smokeless tobacco: Never Used  Substance Use Topics  . Alcohol use: Not Currently    Comment: ocasional    ALLERGIES: No Known Allergies   PERTINENT MEDICATIONS:  Outpatient Encounter Medications as of 10/17/2018  Medication Sig  . acetaminophen (TYLENOL) 325 MG tablet Take 650 mg by mouth every 6 (six) hours as needed for mild  pain.  . albuterol (PROVENTIL) (2.5 MG/3ML) 0.083% nebulizer solution Take 2.5 mg by nebulization every 4 (four) hours as needed for wheezing or shortness of breath.   Marland Kitchen aspirin EC 81 MG tablet Take 81 mg by mouth daily.  . bisacodyl (DULCOLAX) 5 MG EC tablet Take 2 tablets (10 mg total) by mouth daily as needed for moderate constipation.  . calcitonin, salmon, (MIACALCIN/FORTICAL) 200 UNIT/ACT nasal spray Place 1 spray into  alternate nostrils See admin instructions. Spray 1 spray into Right nostril every other day then alternating with left nostril  . Calcium Carb-Cholecalciferol (CALCIUM 600+D3 PO) Take 1 tablet by mouth daily with breakfast.  . Cyanocobalamin (VITAMIN B 12 PO) Take 100 mcg by mouth daily.   Marland Kitchen estradiol (ESTRACE) 0.1 MG/GM vaginal cream Place 1 g vaginally daily.  . furosemide (LASIX) 20 MG tablet Take 10-20 mg by mouth as directed. 10 Mg in the AM and 20 Mg in the PM  . ipratropium (ATROVENT) 0.02 % nebulizer solution Take 0.5 mg by nebulization 2 (two) times daily as needed for wheezing or shortness of breath.  . metoprolol succinate (TOPROL-XL) 50 MG 24 hr tablet Take 1 tablet (50 mg total) by mouth daily. Take with or immediately following a meal. (Patient taking differently: Take 50 mg by mouth See admin instructions. Take two  tablets (100mg ) by mouth every morning.)  . Neomy-Bacit-Polymyx-Pramoxine (QC TRIPLE ANTIBIOTIC EX) Apply 1 application topically daily. To little toe nail bed  . nystatin cream (MYCOSTATIN) Apply 1 application topically See admin instructions. Apply to affected areas every 12 hours as needed for diaper rash.  . polyethylene glycol (MIRALAX) packet Take 17 g by mouth daily as needed for moderate constipation (over the counter). (Patient taking differently: Take 17 g by mouth daily as needed for moderate constipation. )  . povidone-iodine (BETADINE) 10 % external solution Apply 1 application topically See admin instructions. To little toe before applying ointment  . Skin Protectants, Misc. (BAZA PROTECT EX) Apply 1 application topically as needed (apply to buttocks wit each diaper change as needed).  Marland Kitchen sulfamethoxazole-trimethoprim (BACTRIM DS,SEPTRA DS) 800-160 MG tablet Take 1 tablet by mouth 3 (three) times a week.   No facility-administered encounter medications on file as of 10/17/2018.     PHYSICAL EXAM:   General: NAD, frail appearing, sitting in common area.  Pleasantly confused. Short off topic phrases. Some word salad. Able to follow simple commands. Cardiovascular: regular rate and rhythm, harsh systolic murmur. No significant JVD. Pulmonary: Clear anterior lung fields Abdomen: soft, nontender, + bowel sounds Extremities: mild softly pitting bilateral LE edema to low calf Skin: Scabbed over RLE posterior calf  wound withoutdischarge. No surrounding areas inflammation or redness. Neurological: Weakness but otherwise nonfocal. A & O to self only. Didn't remember she had a son.   Anselm Lis, NP

## 2018-10-22 DIAGNOSIS — R262 Difficulty in walking, not elsewhere classified: Secondary | ICD-10-CM | POA: Diagnosis not present

## 2018-10-23 ENCOUNTER — Other Ambulatory Visit: Payer: Self-pay | Admitting: Family Medicine

## 2018-10-23 ENCOUNTER — Ambulatory Visit
Admission: RE | Admit: 2018-10-23 | Discharge: 2018-10-23 | Disposition: A | Discharge status: Home / Self Care | Payer: Medicare Other | Source: Ambulatory Visit | Attending: Family Medicine | Admitting: Family Medicine

## 2018-10-23 DIAGNOSIS — M25431 Effusion, right wrist: Secondary | ICD-10-CM

## 2018-10-23 DIAGNOSIS — L0390 Cellulitis, unspecified: Secondary | ICD-10-CM | POA: Diagnosis not present

## 2018-10-23 DIAGNOSIS — M25531 Pain in right wrist: Secondary | ICD-10-CM

## 2018-10-23 DIAGNOSIS — M7989 Other specified soft tissue disorders: Secondary | ICD-10-CM

## 2018-10-23 DIAGNOSIS — S6991XA Unspecified injury of right wrist, hand and finger(s), initial encounter: Secondary | ICD-10-CM | POA: Diagnosis not present

## 2018-10-23 DIAGNOSIS — R609 Edema, unspecified: Secondary | ICD-10-CM | POA: Diagnosis not present

## 2018-10-23 DIAGNOSIS — I509 Heart failure, unspecified: Secondary | ICD-10-CM | POA: Diagnosis not present

## 2018-10-23 DIAGNOSIS — M79631 Pain in right forearm: Secondary | ICD-10-CM

## 2018-10-23 DIAGNOSIS — D649 Anemia, unspecified: Secondary | ICD-10-CM | POA: Diagnosis not present

## 2018-10-23 DIAGNOSIS — N289 Disorder of kidney and ureter, unspecified: Secondary | ICD-10-CM | POA: Diagnosis not present

## 2018-10-23 DIAGNOSIS — S59911A Unspecified injury of right forearm, initial encounter: Secondary | ICD-10-CM | POA: Diagnosis not present

## 2018-10-24 DIAGNOSIS — N390 Urinary tract infection, site not specified: Secondary | ICD-10-CM | POA: Diagnosis not present

## 2018-10-24 DIAGNOSIS — R262 Difficulty in walking, not elsewhere classified: Secondary | ICD-10-CM | POA: Diagnosis not present

## 2018-10-25 DIAGNOSIS — R262 Difficulty in walking, not elsewhere classified: Secondary | ICD-10-CM | POA: Diagnosis not present

## 2018-10-28 DIAGNOSIS — F0390 Unspecified dementia without behavioral disturbance: Secondary | ICD-10-CM | POA: Diagnosis not present

## 2018-10-28 DIAGNOSIS — I509 Heart failure, unspecified: Secondary | ICD-10-CM | POA: Diagnosis not present

## 2018-10-28 DIAGNOSIS — L0390 Cellulitis, unspecified: Secondary | ICD-10-CM | POA: Diagnosis not present

## 2018-10-28 DIAGNOSIS — Z6821 Body mass index (BMI) 21.0-21.9, adult: Secondary | ICD-10-CM | POA: Diagnosis not present

## 2018-10-28 DIAGNOSIS — R609 Edema, unspecified: Secondary | ICD-10-CM | POA: Diagnosis not present

## 2018-10-29 ENCOUNTER — Encounter: Payer: Self-pay | Admitting: Internal Medicine

## 2018-10-29 ENCOUNTER — Non-Acute Institutional Stay: Payer: Medicare Other | Admitting: Internal Medicine

## 2018-10-29 VITALS — HR 76 | Resp 12 | Ht 60.0 in | Wt 113.4 lb

## 2018-10-29 DIAGNOSIS — Z515 Encounter for palliative care: Secondary | ICD-10-CM | POA: Diagnosis not present

## 2018-10-29 DIAGNOSIS — R262 Difficulty in walking, not elsewhere classified: Secondary | ICD-10-CM | POA: Diagnosis not present

## 2018-10-29 NOTE — Progress Notes (Signed)
Feb 25th, 2020 [facility] Telephone: [phone] Fax: [phone]  PATIENT NAME:Katherine Kirby DOB:06/04/1922 PVX:[id number] [facility] [place] [address]  PRIMARY CARE PROVIDER:Dewey, Christell Constant, MD Dr. Delrae Rend (Cardiology)  REFERRING PROVIDER:Dewey, Christell Constant, MD [address] [place], [place] [address]  RESPONSIBLE PARTY:*D-I-LRebecca 336 [phone] (cell), son Jillyn Hidden  IMPRESSION: 1.Dementia and progression in functional decline: FAST score 7a. Her PPS is 30%. She needs assist with hygiene, dressing, and now for  transfers. She will not eat unless provided direction and cueing. She is incontinent of bowel and bladder. She is constantly confused, and oriented to herself only. She can able to ambulate with a slow shuffling gait with her rolling walker, only when cued. Staff report increased daytime somnolence. Her current weight is 113.4, which is roughly stable over these last few months. At a height of 5', her BMI is 22.2 kg/m2. Labs from 10/23/2018: HGB 8.2.(from 10.0 Oct 2019), BUN 34, Creatinine 1.46 (from Bun 17, Creatine 0.94 on Aug 2019), GFR (31 mod - severe CKD), Alb 3.6.  2. Heart Disease /HOCM / dCHF:  Echo cardiogram 09/2017: severe basal septal hypertrophy, with hypertrophic obstructive cardiomyopathy. Systolic anterior motion of the mitral valve. LV outflow tract gradient of 37 mm Hg. Hospital admission 2019 for syncope.  2. Goals of care: Comfort. To maintain function as best she can within AL facility.  3. Advanced Care Directives: DNR/MOST form on chart.  4. F/U: referral for Hospice services.  I spent32minutes providing this consultation, from 11am to noon. More than 50% of the time in this consultation was spent coordinating communication, review of chart, and interview of staff members.   HPI: Katherine Kirby is a80 y/o female withh/oHOCM, dCHF,CKD,HTN,frequent UTI's,dementia,  anemia, andCOPD. ER visits 08/06/2018 for UTI with associated MS changes, an 01/08.2020 for MS changes (neg w/u). This is a f/u PC visit from 10/17/2018. PCP (Dr. Maryelizabeth Rowan requested reevaluation for hospice candidacy.   ADVANCED CARE DIRECTIVES: DNR/MOST: DNR/DNI, Scope of Medical Care is limited as to additional interventions. Yes to antibiotics and to IVFs. No feeding tube. Call son Jillyn Hidden before sending resident to the ER.  HOSPICE ELIGIBILITY/DIAGNOSIS:yes/HOCM, progressive cognitive and functional decline  PPS: 30%  PAST MEDICAL HISTORY:  Past Medical History:  Diagnosis Date  . Anemia   . CKD (chronic kidney disease) stage 3, GFR 30-59 ml/min (HCC)   . Dementia (HCC)   . Hypertension   . Lumbar compression fracture (HCC)   . Pelvis fracture (HCC)   . Thoracic compression fracture [facility])     SOCIAL HX:  Social History   Tobacco Use  . Smoking status: Never Smoker  . Smokeless tobacco: Never Used  Substance Use Topics  . Alcohol use: Not Currently    Comment: ocasional    ALLERGIES: No Known Allergies   PERTINENT MEDICATIONS:  Outpatient Encounter Medications as of 10/29/2018  Medication Sig  . acetaminophen (TYLENOL) 325 MG tablet Take 650 mg by mouth every 6 (six) hours as needed for mild pain.  Marland Kitchen albuterol (PROVENTIL) (2.5 MG/3ML) 0.083% nebulizer solution Take 2.5 mg by nebulization every 4 (four) hours as needed for wheezing or shortness of breath.   Marland Kitchen aspirin EC 81 MG tablet Take 81 mg by mouth daily.  . bisacodyl (DULCOLAX) 5 MG EC tablet Take 2 tablets (10 mg total) by mouth daily as needed for moderate constipation.  . calcitonin, salmon, (MIACALCIN/FORTICAL) 200 UNIT/ACT nasal spray Place 1 spray into alternate nostrils See admin instructions. Spray  1 spray into Right nostril every other day then alternating with left nostril  . Calcium Carb-Cholecalciferol (CALCIUM 600+D3 PO) Take 1 tablet by mouth daily with breakfast.  . Cyanocobalamin (VITAMIN B 12 PO)  Take 100 mcg by mouth daily.   Marland Kitchen estradiol (ESTRACE) 0.1 MG/GM vaginal cream Place 1 g vaginally daily.  . furosemide (LASIX) 20 MG tablet Take 10-20 mg by mouth as directed. 10 Mg in the AM and 20 Mg in the PM, and 20mg  prn LE edema, dyspnea, or a 3 lb weight gain in 1 day.  . ipratropium (ATROVENT) 0.02 % nebulizer solution Take 0.5 mg by nebulization 2 (two) times daily as needed for wheezing or shortness of breath.  . metoprolol succinate (TOPROL-XL) 50 MG 24 hr tablet Take 1 tablet (50 mg total) by mouth daily. Take with or immediately following a meal. (Patient taking differently: Take 50 mg by mouth See admin instructions. Take two  tablets (100mg ) by mouth every morning.)  . Neomy-Bacit-Polymyx-Pramoxine (QC TRIPLE ANTIBIOTIC EX) Apply 1 application topically daily. To little toe nail bed  . nystatin cream (MYCOSTATIN) Apply 1 application topically See admin instructions. Apply to affected areas every 12 hours as needed for diaper rash.  . polyethylene glycol (MIRALAX) packet Take 17 g by mouth daily as needed for moderate constipation (over the counter). (Patient taking differently: Take 17 g by mouth daily as needed for moderate constipation. )  . Skin Protectants, Misc. (BAZA PROTECT EX) Apply 1 application topically as needed (apply to buttocks wit each diaper change as needed).  Marland Kitchen sulfamethoxazole-trimethoprim (BACTRIM DS,SEPTRA DS) 800-160 MG tablet Take 1 tablet by mouth 3 (three) times a week.   No facility-administered encounter medications on file as of 10/29/2018.     PHYSICAL EXAM:   Frail appearing elderly femalesitting in common area. She is confused, and speaks in short, off topic phrases. Some word salad. Able to follow simple commands. Cardiovascular: Grouped beats. Harsh systolic murmur. No significant JVD. Pulmonary:Clear anterior lung fields Abdomen: soft, nontender, + bowel sounds Extremities:no LE edema. Skin:exposed skin intact Neurological: Weakness but  otherwise nonfocal. A & O to self only. Unable to name children.  Anselm Lis, NP

## 2018-10-31 ENCOUNTER — Telehealth: Payer: Self-pay | Admitting: Hematology and Oncology

## 2018-10-31 NOTE — Telephone Encounter (Signed)
Hem appt has been scheduled for the pt to see Dr. Pamelia Hoit on 2/29 at 1020am. Aware to arrive early to be checked in on time.

## 2018-11-02 ENCOUNTER — Inpatient Hospital Stay: Payer: Medicare Other | Attending: Hematology and Oncology | Admitting: Hematology and Oncology

## 2018-11-02 ENCOUNTER — Ambulatory Visit: Payer: Medicare Other

## 2018-11-02 DIAGNOSIS — F0390 Unspecified dementia without behavioral disturbance: Secondary | ICD-10-CM | POA: Diagnosis not present

## 2018-11-02 DIAGNOSIS — Z79899 Other long term (current) drug therapy: Secondary | ICD-10-CM | POA: Diagnosis not present

## 2018-11-02 DIAGNOSIS — D631 Anemia in chronic kidney disease: Secondary | ICD-10-CM | POA: Insufficient documentation

## 2018-11-02 DIAGNOSIS — Z7982 Long term (current) use of aspirin: Secondary | ICD-10-CM | POA: Diagnosis not present

## 2018-11-02 DIAGNOSIS — D649 Anemia, unspecified: Secondary | ICD-10-CM

## 2018-11-02 DIAGNOSIS — R262 Difficulty in walking, not elsewhere classified: Secondary | ICD-10-CM | POA: Diagnosis not present

## 2018-11-02 DIAGNOSIS — E039 Hypothyroidism, unspecified: Secondary | ICD-10-CM | POA: Diagnosis not present

## 2018-11-02 DIAGNOSIS — N183 Chronic kidney disease, stage 3 (moderate): Secondary | ICD-10-CM | POA: Diagnosis not present

## 2018-11-02 NOTE — Progress Notes (Signed)
[facility] CONSULT NOTE  Patient Care Team: Lewis Moccasin, MD as PCP - General (Family Medicine) Anselm Lis, NP as Nurse Practitioner (Hospice and Palliative Medicine)  CHIEF COMPLAINTS/PURPOSE OF CONSULTATION: Anemia  HISTORY OF PRESENTING ILLNESS:  Katherine Kirby 85 y.o. female is here because of recent diagnosis of progressively worsening anemia.  Patient resides in the nursing home and has a history of congestive heart failure and dementia as well as chronic kidney disease.  Patient's hemoglobin normally runs around 10 g and recently it had declined to 8.4 g.  She currently takes B12 supplementation.  She does not do much activity at the nursing home.  She occasionally uses a walker but most of the time she is resting in the recliner or in her bed.  She does not feel lightheaded dizzy or short of breath usually.  We were asked to see her to determine if she needs blood transfusion.  I reviewed her records extensively and collaborated the history with the patient.  MEDICAL HISTORY:  Past Medical History:  Diagnosis Date  . Anemia   . CKD (chronic kidney disease) stage 3, GFR 30-59 ml/min (HCC)   . Dementia (HCC)   . Hypertension   . Lumbar compression fracture (HCC)   . Pelvis fracture (HCC)   . Thoracic compression fracture [facility])     SURGICAL HISTORY: Past Surgical History:  Procedure Laterality Date  . TONSILLECTOMY    . TOTAL HIP ARTHROPLASTY Left 2007    SOCIAL HISTORY: Social History   Socioeconomic History  . Marital status: Widowed    Spouse name: Not on file  . Number of children: Not on file  . Years of education: Not on file  . Highest education level: Not on file  Occupational History  . Not on file  Social Needs  . Financial resource strain: Not on file  . Food insecurity:    Worry: Not on file    Inability: Not on file  . Transportation needs:    Medical: Not on file    Non-medical: Not on file  Tobacco Use  . Smoking status:  Never Smoker  . Smokeless tobacco: Never Used  Substance and Sexual Activity  . Alcohol use: Not Currently    Comment: ocasional  . Drug use: No  . Sexual activity: Not on file  Lifestyle  . Physical activity:    Days per week: Not on file    Minutes per session: Not on file  . Stress: Not on file  Relationships  . Social connections:    Talks on phone: Not on file    Gets together: Not on file    Attends religious service: Not on file    Active member of club or organization: Not on file    Attends meetings of clubs or organizations: Not on file    Relationship status: Not on file  . Intimate partner violence:    Fear of current or ex partner: Not on file    Emotionally abused: Not on file    Physically abused: Not on file    Forced sexual activity: Not on file  Other Topics Concern  . Not on file  Social History Narrative  . Not on file    FAMILY HISTORY: No family history on file.  ALLERGIES:  has No Known Allergies.  MEDICATIONS:  Current Outpatient Medications  Medication Sig Dispense Refill  . acetaminophen (TYLENOL) 325 MG tablet Take 650 mg by mouth every 6 (six) hours as  needed for mild pain.    Marland Kitchen albuterol (PROVENTIL) (2.5 MG/3ML) 0.083% nebulizer solution Take 2.5 mg by nebulization every 4 (four) hours as needed for wheezing or shortness of breath.     Marland Kitchen aspirin EC 81 MG tablet Take 81 mg by mouth daily.    . bisacodyl (DULCOLAX) 5 MG EC tablet Take 2 tablets (10 mg total) by mouth daily as needed for moderate constipation. 30 tablet 0  . calcitonin, salmon, (MIACALCIN/FORTICAL) 200 UNIT/ACT nasal spray Place 1 spray into alternate nostrils See admin instructions. Spray 1 spray into Right nostril every other day then alternating with left nostril    . Calcium Carb-Cholecalciferol (CALCIUM 600+D3 PO) Take 1 tablet by mouth daily with breakfast.    . Cyanocobalamin (VITAMIN B 12 PO) Take 100 mcg by mouth daily.     Marland Kitchen estradiol (ESTRACE) 0.1 MG/GM vaginal cream  Place 1 g vaginally daily.    . furosemide (LASIX) 20 MG tablet Take 10-20 mg by mouth as directed. 10 Mg in the AM and 20 Mg in the PM, and 20mg  prn LE edema, dyspnea, or a 3 lb weight gain in 1 day.    . ipratropium (ATROVENT) 0.02 % nebulizer solution Take 0.5 mg by nebulization 2 (two) times daily as needed for wheezing or shortness of breath.    . metoprolol succinate (TOPROL-XL) 50 MG 24 hr tablet Take 1 tablet (50 mg total) by mouth daily. Take with or immediately following a meal. (Patient taking differently: Take 50 mg by mouth See admin instructions. Take two  tablets (100mg ) by mouth every morning.) 30 tablet 0  . Neomy-Bacit-Polymyx-Pramoxine (QC TRIPLE ANTIBIOTIC EX) Apply 1 application topically daily. To little toe nail bed    . nystatin cream (MYCOSTATIN) Apply 1 application topically See admin instructions. Apply to affected areas every 12 hours as needed for diaper rash.    . polyethylene glycol (MIRALAX) packet Take 17 g by mouth daily as needed for moderate constipation (over the counter). (Patient taking differently: Take 17 g by mouth daily as needed for moderate constipation. ) 30 each 3  . Skin Protectants, Misc. (BAZA PROTECT EX) Apply 1 application topically as needed (apply to buttocks wit each diaper change as needed).    Marland Kitchen sulfamethoxazole-trimethoprim (BACTRIM DS,SEPTRA DS) 800-160 MG tablet Take 1 tablet by mouth 3 (three) times a week.     No current facility-administered medications for this visit.     REVIEW OF SYSTEMS:   Constitutional: Generalized fatigue and weakness Eyes: Denies blurriness of vision, double vision or watery eyes Ears, nose, mouth, throat, and face: Denies mucositis or sore throat Respiratory: Denies cough, dyspnea or wheezes Cardiovascular: Denies palpitation, chest discomfort or lower extremity swelling Gastrointestinal:  Denies nausea, heartburn or change in bowel habits Skin: Denies abnormal skin rashes Lymphatics: Denies new  lymphadenopathy or easy bruising Neurological: Uses a wheelchair to get around occasionally uses a walker Behavioral/Psych: Mood is stable, no new changes    All other systems were reviewed with the patient and are negative.  PHYSICAL EXAMINATION: ECOG PERFORMANCE STATUS: 3 - Symptomatic, >50% confined to bed  Vitals:   11/02/18 1020  BP: (!) 131/57  Pulse: 69  Resp: 18  Temp: 98.4 F (36.9 C)  SpO2: 96%   Filed Weights    GENERAL:alert, no distress and comfortable SKIN: skin color, texture, turgor are normal, no rashes or significant lesions EYES: normal, conjunctiva are pink and non-injected, sclera clear OROPHARYNX:no exudate, no erythema and lips, buccal mucosa, and tongue  normal  NECK: supple, thyroid normal size, non-tender, without nodularity LYMPH:  no palpable lymphadenopathy in the cervical, axillary or inguinal LUNGS: clear to auscultation and percussion with normal breathing effort HEART: regular rate & rhythm and no murmurs and no lower extremity edema ABDOMEN:abdomen soft, non-tender and normal bowel sounds Musculoskeletal:no cyanosis of digits and no clubbing  PSYCH: alert & oriented x 3 with fluent speech NEURO: no focal motor/sensory deficits, lower extremity weakness    LABORATORY DATA:  I have reviewed the data as listed Lab Results  Component Value Date   WBC 3.6 (L) 09/11/2018   HGB 9.0 (L) 09/11/2018   HCT 28.5 (L) 09/11/2018   MCV 100.7 (H) 09/11/2018   PLT 165 09/11/2018   Lab Results  Component Value Date   NA 138 09/11/2018   K 4.2 09/11/2018   CL 109 09/11/2018   CO2 22 09/11/2018   ASSESSMENT AND PLAN:  Normocytic anemia Lab review:  09/11/2018: Hemoglobin 9 (previously 8.4), MCV 100, WBC 3.6, creatinine 1.37  Differential diagnosis: 1. Anemia due to chronic disease and inflammation 2. anemia due to renal dysfunction 3. Combined B-12 and iron deficiency anemias 4. Hemolysis 5. Hypothyroidism 6. Plasma cell disorders myeloma 7.  Bone marrow dysfunction with MDS --------------------------------------------------------------------------------------------------------------- Given the patient's advanced age and the family's wishes, no work-up will be performed. If she becomes significantly symptomatic with shortness of breath to minimal exertion, and dizziness to standing or if anemia is impacting her quality of life in a significant manner, we can transfuse her 1 unit of PRBC with IV Lasix following that, given her history of heart failure.  Patient will be seen on an as-needed basis.  All questions were answered. The patient knows to call the clinic with any problems, questions or concerns.    Tamsen Meek, MD 11/02/18

## 2018-11-02 NOTE — Assessment & Plan Note (Signed)
Lab review:  09/11/2018: Hemoglobin 9 (previously 8.4), MCV 100, WBC 3.6, creatinine 1.37  Differential diagnosis: 1. Anemia due to chronic disease and inflammation 2. anemia due to renal dysfunction 3. Combined B-12 and iron deficiency anemias 4. Hemolysis 5. Hypothyroidism 6. Plasma cell disorders myeloma 7. Bone marrow dysfunction with MDS  Workup performed: 1. CBC with differential to evaluate the smear 2. CMP to evaluate liver and kidney function 3. Haptoglobin, LDH, reticulocyte count to evaluate hemolysis 4. TSH 5. SPEP 6. Iron and T-55 and folic acid levels  Return to clinic in 1 week to discuss his results and to decide if a bone marrow biopsy is necessary.

## 2018-11-04 ENCOUNTER — Ambulatory Visit: Payer: Self-pay | Admitting: Cardiology

## 2018-11-04 DIAGNOSIS — N952 Postmenopausal atrophic vaginitis: Secondary | ICD-10-CM | POA: Diagnosis not present

## 2018-11-04 DIAGNOSIS — R627 Adult failure to thrive: Secondary | ICD-10-CM | POA: Diagnosis not present

## 2018-11-04 DIAGNOSIS — F0390 Unspecified dementia without behavioral disturbance: Secondary | ICD-10-CM | POA: Diagnosis not present

## 2018-11-04 DIAGNOSIS — Z87440 Personal history of urinary (tract) infections: Secondary | ICD-10-CM | POA: Diagnosis not present

## 2018-11-04 DIAGNOSIS — I130 Hypertensive heart and chronic kidney disease with heart failure and stage 1 through stage 4 chronic kidney disease, or unspecified chronic kidney disease: Secondary | ICD-10-CM | POA: Diagnosis not present

## 2018-11-04 DIAGNOSIS — N183 Chronic kidney disease, stage 3 (moderate): Secondary | ICD-10-CM | POA: Diagnosis not present

## 2018-11-04 DIAGNOSIS — I700 Atherosclerosis of aorta: Secondary | ICD-10-CM | POA: Diagnosis not present

## 2018-11-04 DIAGNOSIS — I509 Heart failure, unspecified: Secondary | ICD-10-CM | POA: Diagnosis not present

## 2018-11-04 DIAGNOSIS — I421 Obstructive hypertrophic cardiomyopathy: Secondary | ICD-10-CM | POA: Diagnosis not present

## 2018-11-04 DIAGNOSIS — D631 Anemia in chronic kidney disease: Secondary | ICD-10-CM | POA: Diagnosis not present

## 2018-11-05 DIAGNOSIS — I509 Heart failure, unspecified: Secondary | ICD-10-CM | POA: Diagnosis not present

## 2018-11-05 DIAGNOSIS — N289 Disorder of kidney and ureter, unspecified: Secondary | ICD-10-CM | POA: Diagnosis not present

## 2018-11-05 DIAGNOSIS — D649 Anemia, unspecified: Secondary | ICD-10-CM | POA: Diagnosis not present

## 2018-11-06 DIAGNOSIS — D631 Anemia in chronic kidney disease: Secondary | ICD-10-CM | POA: Diagnosis not present

## 2018-11-06 DIAGNOSIS — I421 Obstructive hypertrophic cardiomyopathy: Secondary | ICD-10-CM | POA: Diagnosis not present

## 2018-11-06 DIAGNOSIS — I130 Hypertensive heart and chronic kidney disease with heart failure and stage 1 through stage 4 chronic kidney disease, or unspecified chronic kidney disease: Secondary | ICD-10-CM | POA: Diagnosis not present

## 2018-11-06 DIAGNOSIS — N183 Chronic kidney disease, stage 3 (moderate): Secondary | ICD-10-CM | POA: Diagnosis not present

## 2018-11-06 DIAGNOSIS — I509 Heart failure, unspecified: Secondary | ICD-10-CM | POA: Diagnosis not present

## 2018-11-06 DIAGNOSIS — F0390 Unspecified dementia without behavioral disturbance: Secondary | ICD-10-CM | POA: Diagnosis not present

## 2018-11-07 DIAGNOSIS — N289 Disorder of kidney and ureter, unspecified: Secondary | ICD-10-CM | POA: Diagnosis not present

## 2018-11-07 DIAGNOSIS — I421 Obstructive hypertrophic cardiomyopathy: Secondary | ICD-10-CM | POA: Diagnosis not present

## 2018-11-07 DIAGNOSIS — Z6821 Body mass index (BMI) 21.0-21.9, adult: Secondary | ICD-10-CM | POA: Diagnosis not present

## 2018-11-07 DIAGNOSIS — D649 Anemia, unspecified: Secondary | ICD-10-CM | POA: Diagnosis not present

## 2018-11-07 DIAGNOSIS — D631 Anemia in chronic kidney disease: Secondary | ICD-10-CM | POA: Diagnosis not present

## 2018-11-07 DIAGNOSIS — Z7689 Persons encountering health services in other specified circumstances: Secondary | ICD-10-CM | POA: Diagnosis not present

## 2018-11-07 DIAGNOSIS — F0390 Unspecified dementia without behavioral disturbance: Secondary | ICD-10-CM | POA: Diagnosis not present

## 2018-11-07 DIAGNOSIS — I509 Heart failure, unspecified: Secondary | ICD-10-CM | POA: Diagnosis not present

## 2018-11-07 DIAGNOSIS — I130 Hypertensive heart and chronic kidney disease with heart failure and stage 1 through stage 4 chronic kidney disease, or unspecified chronic kidney disease: Secondary | ICD-10-CM | POA: Diagnosis not present

## 2018-11-07 DIAGNOSIS — N183 Chronic kidney disease, stage 3 (moderate): Secondary | ICD-10-CM | POA: Diagnosis not present

## 2018-11-11 DIAGNOSIS — I130 Hypertensive heart and chronic kidney disease with heart failure and stage 1 through stage 4 chronic kidney disease, or unspecified chronic kidney disease: Secondary | ICD-10-CM | POA: Diagnosis not present

## 2018-11-11 DIAGNOSIS — F0390 Unspecified dementia without behavioral disturbance: Secondary | ICD-10-CM | POA: Diagnosis not present

## 2018-11-11 DIAGNOSIS — N183 Chronic kidney disease, stage 3 (moderate): Secondary | ICD-10-CM | POA: Diagnosis not present

## 2018-11-11 DIAGNOSIS — I421 Obstructive hypertrophic cardiomyopathy: Secondary | ICD-10-CM | POA: Diagnosis not present

## 2018-11-11 DIAGNOSIS — I509 Heart failure, unspecified: Secondary | ICD-10-CM | POA: Diagnosis not present

## 2018-11-11 DIAGNOSIS — D631 Anemia in chronic kidney disease: Secondary | ICD-10-CM | POA: Diagnosis not present

## 2018-11-12 DIAGNOSIS — I509 Heart failure, unspecified: Secondary | ICD-10-CM | POA: Diagnosis not present

## 2018-11-12 DIAGNOSIS — F0390 Unspecified dementia without behavioral disturbance: Secondary | ICD-10-CM | POA: Diagnosis not present

## 2018-11-12 DIAGNOSIS — D631 Anemia in chronic kidney disease: Secondary | ICD-10-CM | POA: Diagnosis not present

## 2018-11-12 DIAGNOSIS — N183 Chronic kidney disease, stage 3 (moderate): Secondary | ICD-10-CM | POA: Diagnosis not present

## 2018-11-12 DIAGNOSIS — I421 Obstructive hypertrophic cardiomyopathy: Secondary | ICD-10-CM | POA: Diagnosis not present

## 2018-11-12 DIAGNOSIS — I130 Hypertensive heart and chronic kidney disease with heart failure and stage 1 through stage 4 chronic kidney disease, or unspecified chronic kidney disease: Secondary | ICD-10-CM | POA: Diagnosis not present

## 2018-11-15 DIAGNOSIS — I421 Obstructive hypertrophic cardiomyopathy: Secondary | ICD-10-CM | POA: Diagnosis not present

## 2018-11-15 DIAGNOSIS — N183 Chronic kidney disease, stage 3 (moderate): Secondary | ICD-10-CM | POA: Diagnosis not present

## 2018-11-15 DIAGNOSIS — I509 Heart failure, unspecified: Secondary | ICD-10-CM | POA: Diagnosis not present

## 2018-11-15 DIAGNOSIS — F0390 Unspecified dementia without behavioral disturbance: Secondary | ICD-10-CM | POA: Diagnosis not present

## 2018-11-15 DIAGNOSIS — I130 Hypertensive heart and chronic kidney disease with heart failure and stage 1 through stage 4 chronic kidney disease, or unspecified chronic kidney disease: Secondary | ICD-10-CM | POA: Diagnosis not present

## 2018-11-15 DIAGNOSIS — D631 Anemia in chronic kidney disease: Secondary | ICD-10-CM | POA: Diagnosis not present

## 2018-11-19 DIAGNOSIS — I130 Hypertensive heart and chronic kidney disease with heart failure and stage 1 through stage 4 chronic kidney disease, or unspecified chronic kidney disease: Secondary | ICD-10-CM | POA: Diagnosis not present

## 2018-11-19 DIAGNOSIS — I509 Heart failure, unspecified: Secondary | ICD-10-CM | POA: Diagnosis not present

## 2018-11-19 DIAGNOSIS — N183 Chronic kidney disease, stage 3 (moderate): Secondary | ICD-10-CM | POA: Diagnosis not present

## 2018-11-19 DIAGNOSIS — F0390 Unspecified dementia without behavioral disturbance: Secondary | ICD-10-CM | POA: Diagnosis not present

## 2018-11-19 DIAGNOSIS — D631 Anemia in chronic kidney disease: Secondary | ICD-10-CM | POA: Diagnosis not present

## 2018-11-19 DIAGNOSIS — I421 Obstructive hypertrophic cardiomyopathy: Secondary | ICD-10-CM | POA: Diagnosis not present

## 2018-11-20 DIAGNOSIS — D631 Anemia in chronic kidney disease: Secondary | ICD-10-CM | POA: Diagnosis not present

## 2018-11-20 DIAGNOSIS — I130 Hypertensive heart and chronic kidney disease with heart failure and stage 1 through stage 4 chronic kidney disease, or unspecified chronic kidney disease: Secondary | ICD-10-CM | POA: Diagnosis not present

## 2018-11-20 DIAGNOSIS — F0390 Unspecified dementia without behavioral disturbance: Secondary | ICD-10-CM | POA: Diagnosis not present

## 2018-11-20 DIAGNOSIS — I421 Obstructive hypertrophic cardiomyopathy: Secondary | ICD-10-CM | POA: Diagnosis not present

## 2018-11-20 DIAGNOSIS — I509 Heart failure, unspecified: Secondary | ICD-10-CM | POA: Diagnosis not present

## 2018-11-20 DIAGNOSIS — N183 Chronic kidney disease, stage 3 (moderate): Secondary | ICD-10-CM | POA: Diagnosis not present

## 2018-11-22 DIAGNOSIS — F0390 Unspecified dementia without behavioral disturbance: Secondary | ICD-10-CM | POA: Diagnosis not present

## 2018-11-22 DIAGNOSIS — D631 Anemia in chronic kidney disease: Secondary | ICD-10-CM | POA: Diagnosis not present

## 2018-11-22 DIAGNOSIS — N183 Chronic kidney disease, stage 3 (moderate): Secondary | ICD-10-CM | POA: Diagnosis not present

## 2018-11-22 DIAGNOSIS — I421 Obstructive hypertrophic cardiomyopathy: Secondary | ICD-10-CM | POA: Diagnosis not present

## 2018-11-22 DIAGNOSIS — I130 Hypertensive heart and chronic kidney disease with heart failure and stage 1 through stage 4 chronic kidney disease, or unspecified chronic kidney disease: Secondary | ICD-10-CM | POA: Diagnosis not present

## 2018-11-22 DIAGNOSIS — I509 Heart failure, unspecified: Secondary | ICD-10-CM | POA: Diagnosis not present

## 2018-11-26 DIAGNOSIS — D631 Anemia in chronic kidney disease: Secondary | ICD-10-CM | POA: Diagnosis not present

## 2018-11-26 DIAGNOSIS — I421 Obstructive hypertrophic cardiomyopathy: Secondary | ICD-10-CM | POA: Diagnosis not present

## 2018-11-26 DIAGNOSIS — I130 Hypertensive heart and chronic kidney disease with heart failure and stage 1 through stage 4 chronic kidney disease, or unspecified chronic kidney disease: Secondary | ICD-10-CM | POA: Diagnosis not present

## 2018-11-26 DIAGNOSIS — N183 Chronic kidney disease, stage 3 (moderate): Secondary | ICD-10-CM | POA: Diagnosis not present

## 2018-11-26 DIAGNOSIS — I509 Heart failure, unspecified: Secondary | ICD-10-CM | POA: Diagnosis not present

## 2018-11-26 DIAGNOSIS — F0390 Unspecified dementia without behavioral disturbance: Secondary | ICD-10-CM | POA: Diagnosis not present

## 2018-11-29 DIAGNOSIS — N183 Chronic kidney disease, stage 3 (moderate): Secondary | ICD-10-CM | POA: Diagnosis not present

## 2018-11-29 DIAGNOSIS — F0390 Unspecified dementia without behavioral disturbance: Secondary | ICD-10-CM | POA: Diagnosis not present

## 2018-11-29 DIAGNOSIS — I509 Heart failure, unspecified: Secondary | ICD-10-CM | POA: Diagnosis not present

## 2018-11-29 DIAGNOSIS — I421 Obstructive hypertrophic cardiomyopathy: Secondary | ICD-10-CM | POA: Diagnosis not present

## 2018-11-29 DIAGNOSIS — D631 Anemia in chronic kidney disease: Secondary | ICD-10-CM | POA: Diagnosis not present

## 2018-11-29 DIAGNOSIS — I130 Hypertensive heart and chronic kidney disease with heart failure and stage 1 through stage 4 chronic kidney disease, or unspecified chronic kidney disease: Secondary | ICD-10-CM | POA: Diagnosis not present

## 2018-12-03 DIAGNOSIS — D631 Anemia in chronic kidney disease: Secondary | ICD-10-CM | POA: Diagnosis not present

## 2018-12-03 DIAGNOSIS — F0390 Unspecified dementia without behavioral disturbance: Secondary | ICD-10-CM | POA: Diagnosis not present

## 2018-12-03 DIAGNOSIS — I509 Heart failure, unspecified: Secondary | ICD-10-CM | POA: Diagnosis not present

## 2018-12-03 DIAGNOSIS — N183 Chronic kidney disease, stage 3 (moderate): Secondary | ICD-10-CM | POA: Diagnosis not present

## 2018-12-03 DIAGNOSIS — I130 Hypertensive heart and chronic kidney disease with heart failure and stage 1 through stage 4 chronic kidney disease, or unspecified chronic kidney disease: Secondary | ICD-10-CM | POA: Diagnosis not present

## 2018-12-03 DIAGNOSIS — I421 Obstructive hypertrophic cardiomyopathy: Secondary | ICD-10-CM | POA: Diagnosis not present

## 2018-12-04 DIAGNOSIS — N183 Chronic kidney disease, stage 3 (moderate): Secondary | ICD-10-CM | POA: Diagnosis not present

## 2018-12-04 DIAGNOSIS — I130 Hypertensive heart and chronic kidney disease with heart failure and stage 1 through stage 4 chronic kidney disease, or unspecified chronic kidney disease: Secondary | ICD-10-CM | POA: Diagnosis not present

## 2018-12-04 DIAGNOSIS — D631 Anemia in chronic kidney disease: Secondary | ICD-10-CM | POA: Diagnosis not present

## 2018-12-04 DIAGNOSIS — R627 Adult failure to thrive: Secondary | ICD-10-CM | POA: Diagnosis not present

## 2018-12-04 DIAGNOSIS — I421 Obstructive hypertrophic cardiomyopathy: Secondary | ICD-10-CM | POA: Diagnosis not present

## 2018-12-04 DIAGNOSIS — F0390 Unspecified dementia without behavioral disturbance: Secondary | ICD-10-CM | POA: Diagnosis not present

## 2018-12-04 DIAGNOSIS — I700 Atherosclerosis of aorta: Secondary | ICD-10-CM | POA: Diagnosis not present

## 2018-12-04 DIAGNOSIS — I509 Heart failure, unspecified: Secondary | ICD-10-CM | POA: Diagnosis not present

## 2018-12-04 DIAGNOSIS — Z87440 Personal history of urinary (tract) infections: Secondary | ICD-10-CM | POA: Diagnosis not present

## 2018-12-04 DIAGNOSIS — N952 Postmenopausal atrophic vaginitis: Secondary | ICD-10-CM | POA: Diagnosis not present

## 2018-12-06 DIAGNOSIS — I421 Obstructive hypertrophic cardiomyopathy: Secondary | ICD-10-CM | POA: Diagnosis not present

## 2018-12-06 DIAGNOSIS — I130 Hypertensive heart and chronic kidney disease with heart failure and stage 1 through stage 4 chronic kidney disease, or unspecified chronic kidney disease: Secondary | ICD-10-CM | POA: Diagnosis not present

## 2018-12-06 DIAGNOSIS — F0390 Unspecified dementia without behavioral disturbance: Secondary | ICD-10-CM | POA: Diagnosis not present

## 2018-12-06 DIAGNOSIS — I509 Heart failure, unspecified: Secondary | ICD-10-CM | POA: Diagnosis not present

## 2018-12-06 DIAGNOSIS — D631 Anemia in chronic kidney disease: Secondary | ICD-10-CM | POA: Diagnosis not present

## 2018-12-06 DIAGNOSIS — N183 Chronic kidney disease, stage 3 (moderate): Secondary | ICD-10-CM | POA: Diagnosis not present

## 2018-12-18 ENCOUNTER — Telehealth: Payer: Self-pay

## 2018-12-18 NOTE — Telephone Encounter (Signed)
Completely understandable. She can follow up with [place] as needed. Let family know that they can reach out to [place] should they need anything from our standpoint.

## 2019-01-03 DIAGNOSIS — F0390 Unspecified dementia without behavioral disturbance: Secondary | ICD-10-CM | POA: Diagnosis not present

## 2019-01-03 DIAGNOSIS — Z87440 Personal history of urinary (tract) infections: Secondary | ICD-10-CM | POA: Diagnosis not present

## 2019-01-03 DIAGNOSIS — I509 Heart failure, unspecified: Secondary | ICD-10-CM | POA: Diagnosis not present

## 2019-01-03 DIAGNOSIS — D631 Anemia in chronic kidney disease: Secondary | ICD-10-CM | POA: Diagnosis not present

## 2019-01-03 DIAGNOSIS — N952 Postmenopausal atrophic vaginitis: Secondary | ICD-10-CM | POA: Diagnosis not present

## 2019-01-03 DIAGNOSIS — N183 Chronic kidney disease, stage 3 (moderate): Secondary | ICD-10-CM | POA: Diagnosis not present

## 2019-01-03 DIAGNOSIS — R627 Adult failure to thrive: Secondary | ICD-10-CM | POA: Diagnosis not present

## 2019-01-03 DIAGNOSIS — I700 Atherosclerosis of aorta: Secondary | ICD-10-CM | POA: Diagnosis not present

## 2019-01-03 DIAGNOSIS — I421 Obstructive hypertrophic cardiomyopathy: Secondary | ICD-10-CM | POA: Diagnosis not present

## 2019-01-03 DIAGNOSIS — I130 Hypertensive heart and chronic kidney disease with heart failure and stage 1 through stage 4 chronic kidney disease, or unspecified chronic kidney disease: Secondary | ICD-10-CM | POA: Diagnosis not present

## 2019-01-06 DIAGNOSIS — N183 Chronic kidney disease, stage 3 (moderate): Secondary | ICD-10-CM | POA: Diagnosis not present

## 2019-01-06 DIAGNOSIS — I130 Hypertensive heart and chronic kidney disease with heart failure and stage 1 through stage 4 chronic kidney disease, or unspecified chronic kidney disease: Secondary | ICD-10-CM | POA: Diagnosis not present

## 2019-01-06 DIAGNOSIS — F0390 Unspecified dementia without behavioral disturbance: Secondary | ICD-10-CM | POA: Diagnosis not present

## 2019-01-06 DIAGNOSIS — I509 Heart failure, unspecified: Secondary | ICD-10-CM | POA: Diagnosis not present

## 2019-01-06 DIAGNOSIS — D631 Anemia in chronic kidney disease: Secondary | ICD-10-CM | POA: Diagnosis not present

## 2019-01-06 DIAGNOSIS — I421 Obstructive hypertrophic cardiomyopathy: Secondary | ICD-10-CM | POA: Diagnosis not present

## 2019-02-03 DIAGNOSIS — N183 Chronic kidney disease, stage 3 (moderate): Secondary | ICD-10-CM | POA: Diagnosis not present

## 2019-02-03 DIAGNOSIS — Z993 Dependence on wheelchair: Secondary | ICD-10-CM | POA: Diagnosis not present

## 2019-02-03 DIAGNOSIS — F0390 Unspecified dementia without behavioral disturbance: Secondary | ICD-10-CM | POA: Diagnosis not present

## 2019-02-03 DIAGNOSIS — N952 Postmenopausal atrophic vaginitis: Secondary | ICD-10-CM | POA: Diagnosis not present

## 2019-02-03 DIAGNOSIS — I509 Heart failure, unspecified: Secondary | ICD-10-CM | POA: Diagnosis not present

## 2019-02-03 DIAGNOSIS — R159 Full incontinence of feces: Secondary | ICD-10-CM | POA: Diagnosis not present

## 2019-02-03 DIAGNOSIS — Z681 Body mass index (BMI) 19 or less, adult: Secondary | ICD-10-CM | POA: Diagnosis not present

## 2019-02-03 DIAGNOSIS — D631 Anemia in chronic kidney disease: Secondary | ICD-10-CM | POA: Diagnosis not present

## 2019-02-03 DIAGNOSIS — I700 Atherosclerosis of aorta: Secondary | ICD-10-CM | POA: Diagnosis not present

## 2019-02-03 DIAGNOSIS — R627 Adult failure to thrive: Secondary | ICD-10-CM | POA: Diagnosis not present

## 2019-02-03 DIAGNOSIS — L03116 Cellulitis of left lower limb: Secondary | ICD-10-CM | POA: Diagnosis not present

## 2019-02-03 DIAGNOSIS — R32 Unspecified urinary incontinence: Secondary | ICD-10-CM | POA: Diagnosis not present

## 2019-02-03 DIAGNOSIS — Z87440 Personal history of urinary (tract) infections: Secondary | ICD-10-CM | POA: Diagnosis not present

## 2019-02-03 DIAGNOSIS — I130 Hypertensive heart and chronic kidney disease with heart failure and stage 1 through stage 4 chronic kidney disease, or unspecified chronic kidney disease: Secondary | ICD-10-CM | POA: Diagnosis not present

## 2019-02-03 DIAGNOSIS — I421 Obstructive hypertrophic cardiomyopathy: Secondary | ICD-10-CM | POA: Diagnosis not present

## 2019-02-04 DIAGNOSIS — I509 Heart failure, unspecified: Secondary | ICD-10-CM | POA: Diagnosis not present

## 2019-02-04 DIAGNOSIS — I421 Obstructive hypertrophic cardiomyopathy: Secondary | ICD-10-CM | POA: Diagnosis not present

## 2019-02-04 DIAGNOSIS — I130 Hypertensive heart and chronic kidney disease with heart failure and stage 1 through stage 4 chronic kidney disease, or unspecified chronic kidney disease: Secondary | ICD-10-CM | POA: Diagnosis not present

## 2019-02-04 DIAGNOSIS — D631 Anemia in chronic kidney disease: Secondary | ICD-10-CM | POA: Diagnosis not present

## 2019-02-04 DIAGNOSIS — F0390 Unspecified dementia without behavioral disturbance: Secondary | ICD-10-CM | POA: Diagnosis not present

## 2019-02-04 DIAGNOSIS — N183 Chronic kidney disease, stage 3 (moderate): Secondary | ICD-10-CM | POA: Diagnosis not present

## 2019-02-25 DIAGNOSIS — F0390 Unspecified dementia without behavioral disturbance: Secondary | ICD-10-CM | POA: Diagnosis not present

## 2019-02-25 DIAGNOSIS — N183 Chronic kidney disease, stage 3 (moderate): Secondary | ICD-10-CM | POA: Diagnosis not present

## 2019-02-25 DIAGNOSIS — D631 Anemia in chronic kidney disease: Secondary | ICD-10-CM | POA: Diagnosis not present

## 2019-02-25 DIAGNOSIS — I509 Heart failure, unspecified: Secondary | ICD-10-CM | POA: Diagnosis not present

## 2019-02-25 DIAGNOSIS — I421 Obstructive hypertrophic cardiomyopathy: Secondary | ICD-10-CM | POA: Diagnosis not present

## 2019-02-25 DIAGNOSIS — I130 Hypertensive heart and chronic kidney disease with heart failure and stage 1 through stage 4 chronic kidney disease, or unspecified chronic kidney disease: Secondary | ICD-10-CM | POA: Diagnosis not present

## 2019-03-03 DIAGNOSIS — I10 Essential (primary) hypertension: Secondary | ICD-10-CM | POA: Diagnosis not present

## 2019-03-03 DIAGNOSIS — F0390 Unspecified dementia without behavioral disturbance: Secondary | ICD-10-CM | POA: Diagnosis not present

## 2019-03-03 DIAGNOSIS — I509 Heart failure, unspecified: Secondary | ICD-10-CM | POA: Diagnosis not present

## 2019-03-03 DIAGNOSIS — N289 Disorder of kidney and ureter, unspecified: Secondary | ICD-10-CM | POA: Diagnosis not present

## 2019-03-03 DIAGNOSIS — E871 Hypo-osmolality and hyponatremia: Secondary | ICD-10-CM | POA: Diagnosis not present

## 2019-03-04 DIAGNOSIS — I509 Heart failure, unspecified: Secondary | ICD-10-CM | POA: Diagnosis not present

## 2019-03-04 DIAGNOSIS — F0390 Unspecified dementia without behavioral disturbance: Secondary | ICD-10-CM | POA: Diagnosis not present

## 2019-03-04 DIAGNOSIS — I130 Hypertensive heart and chronic kidney disease with heart failure and stage 1 through stage 4 chronic kidney disease, or unspecified chronic kidney disease: Secondary | ICD-10-CM | POA: Diagnosis not present

## 2019-03-04 DIAGNOSIS — N183 Chronic kidney disease, stage 3 (moderate): Secondary | ICD-10-CM | POA: Diagnosis not present

## 2019-03-04 DIAGNOSIS — I421 Obstructive hypertrophic cardiomyopathy: Secondary | ICD-10-CM | POA: Diagnosis not present

## 2019-03-04 DIAGNOSIS — D631 Anemia in chronic kidney disease: Secondary | ICD-10-CM | POA: Diagnosis not present

## 2019-03-05 DIAGNOSIS — R32 Unspecified urinary incontinence: Secondary | ICD-10-CM | POA: Diagnosis not present

## 2019-03-05 DIAGNOSIS — I509 Heart failure, unspecified: Secondary | ICD-10-CM | POA: Diagnosis not present

## 2019-03-05 DIAGNOSIS — Z87440 Personal history of urinary (tract) infections: Secondary | ICD-10-CM | POA: Diagnosis not present

## 2019-03-05 DIAGNOSIS — N952 Postmenopausal atrophic vaginitis: Secondary | ICD-10-CM | POA: Diagnosis not present

## 2019-03-05 DIAGNOSIS — N183 Chronic kidney disease, stage 3 (moderate): Secondary | ICD-10-CM | POA: Diagnosis not present

## 2019-03-05 DIAGNOSIS — Z993 Dependence on wheelchair: Secondary | ICD-10-CM | POA: Diagnosis not present

## 2019-03-05 DIAGNOSIS — D631 Anemia in chronic kidney disease: Secondary | ICD-10-CM | POA: Diagnosis not present

## 2019-03-05 DIAGNOSIS — I700 Atherosclerosis of aorta: Secondary | ICD-10-CM | POA: Diagnosis not present

## 2019-03-05 DIAGNOSIS — F0390 Unspecified dementia without behavioral disturbance: Secondary | ICD-10-CM | POA: Diagnosis not present

## 2019-03-05 DIAGNOSIS — R627 Adult failure to thrive: Secondary | ICD-10-CM | POA: Diagnosis not present

## 2019-03-05 DIAGNOSIS — I130 Hypertensive heart and chronic kidney disease with heart failure and stage 1 through stage 4 chronic kidney disease, or unspecified chronic kidney disease: Secondary | ICD-10-CM | POA: Diagnosis not present

## 2019-03-05 DIAGNOSIS — L03116 Cellulitis of left lower limb: Secondary | ICD-10-CM | POA: Diagnosis not present

## 2019-03-05 DIAGNOSIS — Z681 Body mass index (BMI) 19 or less, adult: Secondary | ICD-10-CM | POA: Diagnosis not present

## 2019-03-05 DIAGNOSIS — I421 Obstructive hypertrophic cardiomyopathy: Secondary | ICD-10-CM | POA: Diagnosis not present

## 2019-03-05 DIAGNOSIS — R159 Full incontinence of feces: Secondary | ICD-10-CM | POA: Diagnosis not present

## 2019-03-07 DIAGNOSIS — I130 Hypertensive heart and chronic kidney disease with heart failure and stage 1 through stage 4 chronic kidney disease, or unspecified chronic kidney disease: Secondary | ICD-10-CM | POA: Diagnosis not present

## 2019-03-07 DIAGNOSIS — F0390 Unspecified dementia without behavioral disturbance: Secondary | ICD-10-CM | POA: Diagnosis not present

## 2019-03-07 DIAGNOSIS — I509 Heart failure, unspecified: Secondary | ICD-10-CM | POA: Diagnosis not present

## 2019-03-07 DIAGNOSIS — D631 Anemia in chronic kidney disease: Secondary | ICD-10-CM | POA: Diagnosis not present

## 2019-03-07 DIAGNOSIS — N183 Chronic kidney disease, stage 3 (moderate): Secondary | ICD-10-CM | POA: Diagnosis not present

## 2019-03-07 DIAGNOSIS — I421 Obstructive hypertrophic cardiomyopathy: Secondary | ICD-10-CM | POA: Diagnosis not present

## 2019-03-12 DIAGNOSIS — I421 Obstructive hypertrophic cardiomyopathy: Secondary | ICD-10-CM | POA: Diagnosis not present

## 2019-03-12 DIAGNOSIS — I509 Heart failure, unspecified: Secondary | ICD-10-CM | POA: Diagnosis not present

## 2019-03-12 DIAGNOSIS — N183 Chronic kidney disease, stage 3 (moderate): Secondary | ICD-10-CM | POA: Diagnosis not present

## 2019-03-12 DIAGNOSIS — D631 Anemia in chronic kidney disease: Secondary | ICD-10-CM | POA: Diagnosis not present

## 2019-03-12 DIAGNOSIS — I130 Hypertensive heart and chronic kidney disease with heart failure and stage 1 through stage 4 chronic kidney disease, or unspecified chronic kidney disease: Secondary | ICD-10-CM | POA: Diagnosis not present

## 2019-03-12 DIAGNOSIS — F0390 Unspecified dementia without behavioral disturbance: Secondary | ICD-10-CM | POA: Diagnosis not present

## 2019-03-19 DIAGNOSIS — D631 Anemia in chronic kidney disease: Secondary | ICD-10-CM | POA: Diagnosis not present

## 2019-03-19 DIAGNOSIS — N183 Chronic kidney disease, stage 3 (moderate): Secondary | ICD-10-CM | POA: Diagnosis not present

## 2019-03-19 DIAGNOSIS — I130 Hypertensive heart and chronic kidney disease with heart failure and stage 1 through stage 4 chronic kidney disease, or unspecified chronic kidney disease: Secondary | ICD-10-CM | POA: Diagnosis not present

## 2019-03-19 DIAGNOSIS — F0390 Unspecified dementia without behavioral disturbance: Secondary | ICD-10-CM | POA: Diagnosis not present

## 2019-03-19 DIAGNOSIS — I421 Obstructive hypertrophic cardiomyopathy: Secondary | ICD-10-CM | POA: Diagnosis not present

## 2019-03-19 DIAGNOSIS — I509 Heart failure, unspecified: Secondary | ICD-10-CM | POA: Diagnosis not present

## 2019-03-28 DIAGNOSIS — I421 Obstructive hypertrophic cardiomyopathy: Secondary | ICD-10-CM | POA: Diagnosis not present

## 2019-03-28 DIAGNOSIS — I509 Heart failure, unspecified: Secondary | ICD-10-CM | POA: Diagnosis not present

## 2019-03-28 DIAGNOSIS — D631 Anemia in chronic kidney disease: Secondary | ICD-10-CM | POA: Diagnosis not present

## 2019-03-28 DIAGNOSIS — I130 Hypertensive heart and chronic kidney disease with heart failure and stage 1 through stage 4 chronic kidney disease, or unspecified chronic kidney disease: Secondary | ICD-10-CM | POA: Diagnosis not present

## 2019-03-28 DIAGNOSIS — F0390 Unspecified dementia without behavioral disturbance: Secondary | ICD-10-CM | POA: Diagnosis not present

## 2019-03-28 DIAGNOSIS — N183 Chronic kidney disease, stage 3 (moderate): Secondary | ICD-10-CM | POA: Diagnosis not present

## 2019-03-31 DIAGNOSIS — I509 Heart failure, unspecified: Secondary | ICD-10-CM | POA: Diagnosis not present

## 2019-03-31 DIAGNOSIS — N183 Chronic kidney disease, stage 3 (moderate): Secondary | ICD-10-CM | POA: Diagnosis not present

## 2019-03-31 DIAGNOSIS — F0390 Unspecified dementia without behavioral disturbance: Secondary | ICD-10-CM | POA: Diagnosis not present

## 2019-03-31 DIAGNOSIS — I421 Obstructive hypertrophic cardiomyopathy: Secondary | ICD-10-CM | POA: Diagnosis not present

## 2019-03-31 DIAGNOSIS — D631 Anemia in chronic kidney disease: Secondary | ICD-10-CM | POA: Diagnosis not present

## 2019-03-31 DIAGNOSIS — I130 Hypertensive heart and chronic kidney disease with heart failure and stage 1 through stage 4 chronic kidney disease, or unspecified chronic kidney disease: Secondary | ICD-10-CM | POA: Diagnosis not present

## 2019-04-05 DIAGNOSIS — I700 Atherosclerosis of aorta: Secondary | ICD-10-CM | POA: Diagnosis not present

## 2019-04-05 DIAGNOSIS — N183 Chronic kidney disease, stage 3 (moderate): Secondary | ICD-10-CM | POA: Diagnosis not present

## 2019-04-05 DIAGNOSIS — F0390 Unspecified dementia without behavioral disturbance: Secondary | ICD-10-CM | POA: Diagnosis not present

## 2019-04-05 DIAGNOSIS — Z681 Body mass index (BMI) 19 or less, adult: Secondary | ICD-10-CM | POA: Diagnosis not present

## 2019-04-05 DIAGNOSIS — N952 Postmenopausal atrophic vaginitis: Secondary | ICD-10-CM | POA: Diagnosis not present

## 2019-04-05 DIAGNOSIS — R627 Adult failure to thrive: Secondary | ICD-10-CM | POA: Diagnosis not present

## 2019-04-05 DIAGNOSIS — D631 Anemia in chronic kidney disease: Secondary | ICD-10-CM | POA: Diagnosis not present

## 2019-04-05 DIAGNOSIS — R32 Unspecified urinary incontinence: Secondary | ICD-10-CM | POA: Diagnosis not present

## 2019-04-05 DIAGNOSIS — L03116 Cellulitis of left lower limb: Secondary | ICD-10-CM | POA: Diagnosis not present

## 2019-04-05 DIAGNOSIS — I130 Hypertensive heart and chronic kidney disease with heart failure and stage 1 through stage 4 chronic kidney disease, or unspecified chronic kidney disease: Secondary | ICD-10-CM | POA: Diagnosis not present

## 2019-04-05 DIAGNOSIS — R159 Full incontinence of feces: Secondary | ICD-10-CM | POA: Diagnosis not present

## 2019-04-05 DIAGNOSIS — I509 Heart failure, unspecified: Secondary | ICD-10-CM | POA: Diagnosis not present

## 2019-04-05 DIAGNOSIS — I421 Obstructive hypertrophic cardiomyopathy: Secondary | ICD-10-CM | POA: Diagnosis not present

## 2019-04-05 DIAGNOSIS — Z993 Dependence on wheelchair: Secondary | ICD-10-CM | POA: Diagnosis not present

## 2019-04-05 DIAGNOSIS — Z87440 Personal history of urinary (tract) infections: Secondary | ICD-10-CM | POA: Diagnosis not present

## 2019-04-07 ENCOUNTER — Other Ambulatory Visit: Payer: Self-pay

## 2019-04-08 DIAGNOSIS — I130 Hypertensive heart and chronic kidney disease with heart failure and stage 1 through stage 4 chronic kidney disease, or unspecified chronic kidney disease: Secondary | ICD-10-CM | POA: Diagnosis not present

## 2019-04-08 DIAGNOSIS — F0390 Unspecified dementia without behavioral disturbance: Secondary | ICD-10-CM | POA: Diagnosis not present

## 2019-04-08 DIAGNOSIS — I421 Obstructive hypertrophic cardiomyopathy: Secondary | ICD-10-CM | POA: Diagnosis not present

## 2019-04-08 DIAGNOSIS — I509 Heart failure, unspecified: Secondary | ICD-10-CM | POA: Diagnosis not present

## 2019-04-08 DIAGNOSIS — N183 Chronic kidney disease, stage 3 (moderate): Secondary | ICD-10-CM | POA: Diagnosis not present

## 2019-04-08 DIAGNOSIS — D631 Anemia in chronic kidney disease: Secondary | ICD-10-CM | POA: Diagnosis not present

## 2019-04-15 DIAGNOSIS — I509 Heart failure, unspecified: Secondary | ICD-10-CM | POA: Diagnosis not present

## 2019-04-15 DIAGNOSIS — F0390 Unspecified dementia without behavioral disturbance: Secondary | ICD-10-CM | POA: Diagnosis not present

## 2019-04-15 DIAGNOSIS — I421 Obstructive hypertrophic cardiomyopathy: Secondary | ICD-10-CM | POA: Diagnosis not present

## 2019-04-15 DIAGNOSIS — N183 Chronic kidney disease, stage 3 (moderate): Secondary | ICD-10-CM | POA: Diagnosis not present

## 2019-04-15 DIAGNOSIS — I130 Hypertensive heart and chronic kidney disease with heart failure and stage 1 through stage 4 chronic kidney disease, or unspecified chronic kidney disease: Secondary | ICD-10-CM | POA: Diagnosis not present

## 2019-04-15 DIAGNOSIS — D631 Anemia in chronic kidney disease: Secondary | ICD-10-CM | POA: Diagnosis not present

## 2019-04-21 DIAGNOSIS — F0390 Unspecified dementia without behavioral disturbance: Secondary | ICD-10-CM | POA: Diagnosis not present

## 2019-04-21 DIAGNOSIS — I130 Hypertensive heart and chronic kidney disease with heart failure and stage 1 through stage 4 chronic kidney disease, or unspecified chronic kidney disease: Secondary | ICD-10-CM | POA: Diagnosis not present

## 2019-04-21 DIAGNOSIS — I421 Obstructive hypertrophic cardiomyopathy: Secondary | ICD-10-CM | POA: Diagnosis not present

## 2019-04-21 DIAGNOSIS — I509 Heart failure, unspecified: Secondary | ICD-10-CM | POA: Diagnosis not present

## 2019-04-21 DIAGNOSIS — D631 Anemia in chronic kidney disease: Secondary | ICD-10-CM | POA: Diagnosis not present

## 2019-04-21 DIAGNOSIS — N183 Chronic kidney disease, stage 3 (moderate): Secondary | ICD-10-CM | POA: Diagnosis not present

## 2019-04-28 DIAGNOSIS — F0390 Unspecified dementia without behavioral disturbance: Secondary | ICD-10-CM | POA: Diagnosis not present

## 2019-04-28 DIAGNOSIS — I509 Heart failure, unspecified: Secondary | ICD-10-CM | POA: Diagnosis not present

## 2019-04-28 DIAGNOSIS — I421 Obstructive hypertrophic cardiomyopathy: Secondary | ICD-10-CM | POA: Diagnosis not present

## 2019-04-28 DIAGNOSIS — N183 Chronic kidney disease, stage 3 (moderate): Secondary | ICD-10-CM | POA: Diagnosis not present

## 2019-04-28 DIAGNOSIS — I130 Hypertensive heart and chronic kidney disease with heart failure and stage 1 through stage 4 chronic kidney disease, or unspecified chronic kidney disease: Secondary | ICD-10-CM | POA: Diagnosis not present

## 2019-04-28 DIAGNOSIS — D631 Anemia in chronic kidney disease: Secondary | ICD-10-CM | POA: Diagnosis not present

## 2019-04-30 DIAGNOSIS — I421 Obstructive hypertrophic cardiomyopathy: Secondary | ICD-10-CM | POA: Diagnosis not present

## 2019-04-30 DIAGNOSIS — I509 Heart failure, unspecified: Secondary | ICD-10-CM | POA: Diagnosis not present

## 2019-04-30 DIAGNOSIS — I130 Hypertensive heart and chronic kidney disease with heart failure and stage 1 through stage 4 chronic kidney disease, or unspecified chronic kidney disease: Secondary | ICD-10-CM | POA: Diagnosis not present

## 2019-04-30 DIAGNOSIS — D631 Anemia in chronic kidney disease: Secondary | ICD-10-CM | POA: Diagnosis not present

## 2019-04-30 DIAGNOSIS — F0390 Unspecified dementia without behavioral disturbance: Secondary | ICD-10-CM | POA: Diagnosis not present

## 2019-04-30 DIAGNOSIS — N183 Chronic kidney disease, stage 3 (moderate): Secondary | ICD-10-CM | POA: Diagnosis not present

## 2019-04-30 IMAGING — CT CT CHEST W/O CM
2 of 4 series · 15 of 36 positions shown, 18 images · non-contrast
Comparison: Chest radiograph [id number]

CLINICAL DATA: On [DATE] developed congestion and coughing but cough
was nonproductive and low-grade fevers. Today while the patient was
sitting at breakfast she fainted and then vomited. Upon awakening
the nurse noticed that the patient was so...*comment was truncated*

EXAM:
CT CHEST WITHOUT CONTRAST
TECHNIQUE: Multidetector CT imaging of the chest was performed following the
standard protocol without IV contrast.

[Series 3: chest w/o 2mm st · axial · non-contrast · 0.66mm/px · z∈[+1162,+1368]mm · 12 of 121 slices shown, 15 images]
[im 9/121  mediastinal]
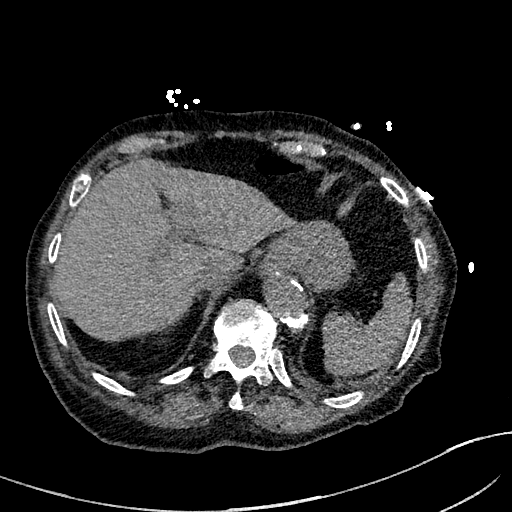
[im 9/121  lung]
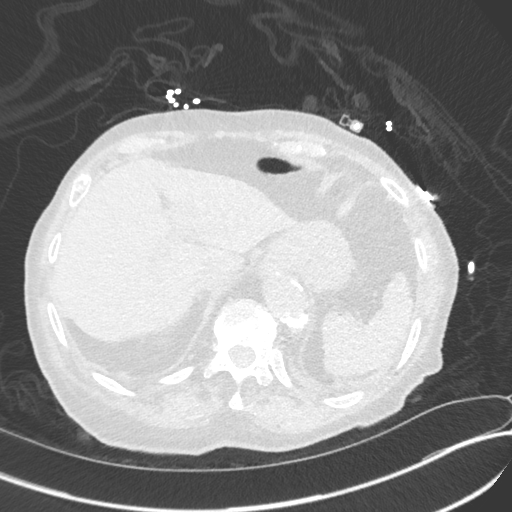
[im 18/121  lung]
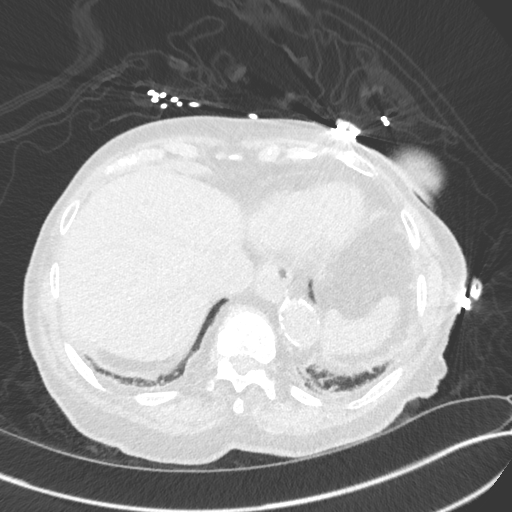
[im 26/121  lung]
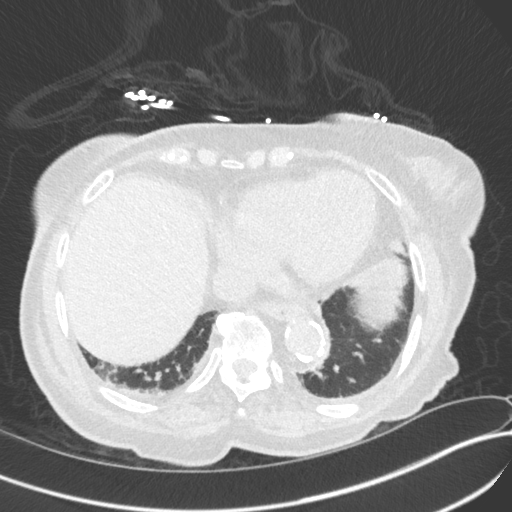
[im 35/121  lung]
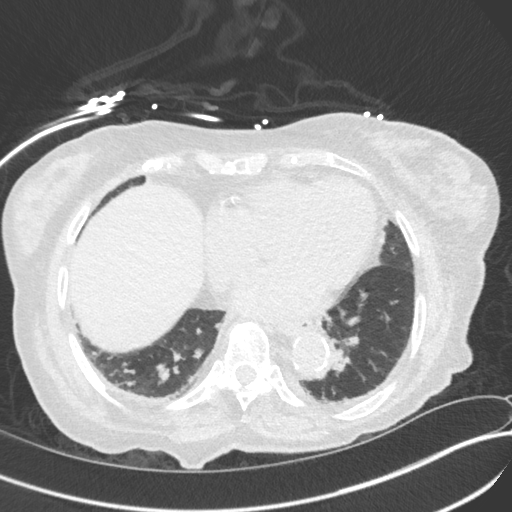
[im 43/121  mediastinal]
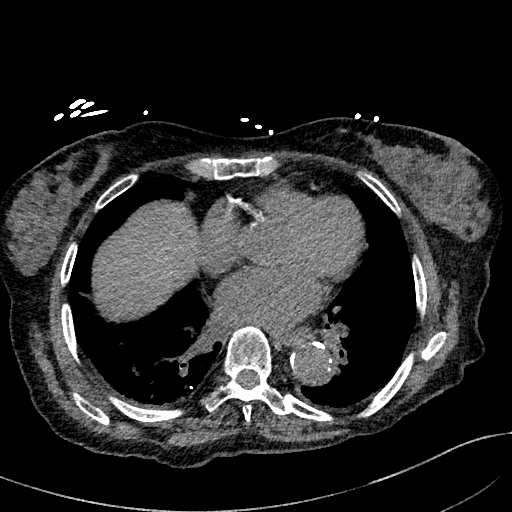
[im 43/121  lung]
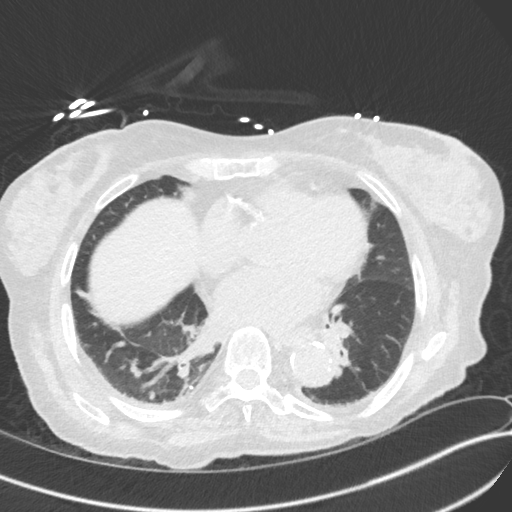
[im 52/121  lung]
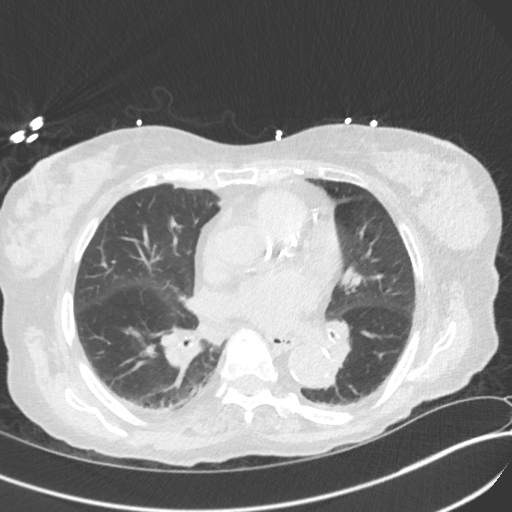
[im 69/121  lung]
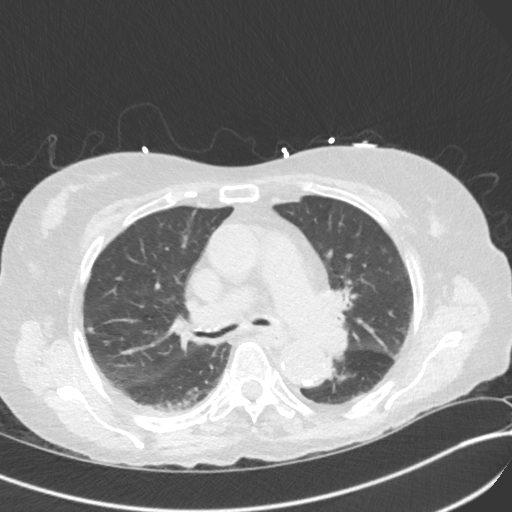
[im 78/121  lung]
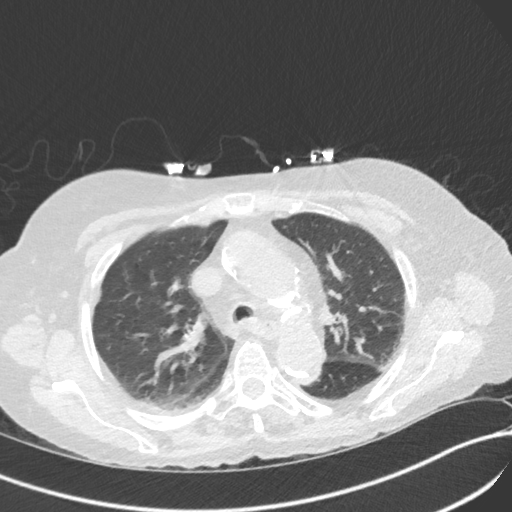
[im 86/121  mediastinal]
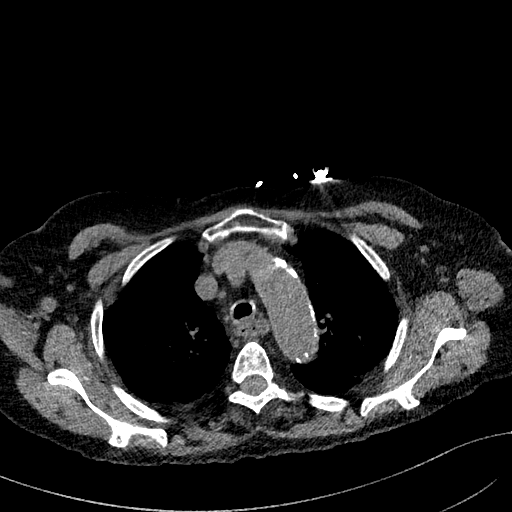
[im 86/121  lung]
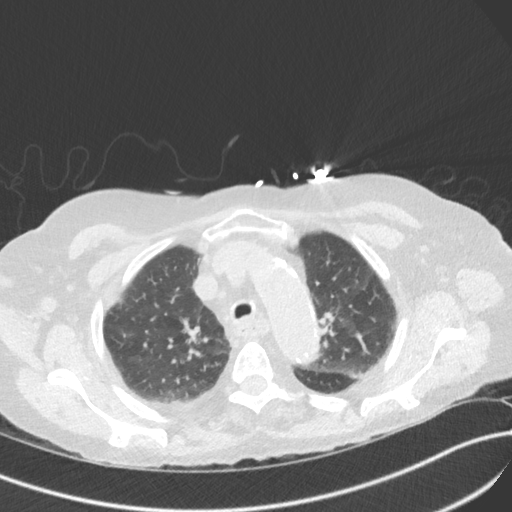
[im 95/121  lung]
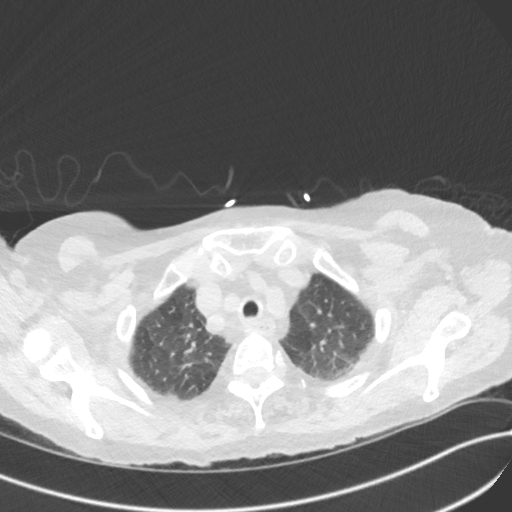
[im 103/121  lung]
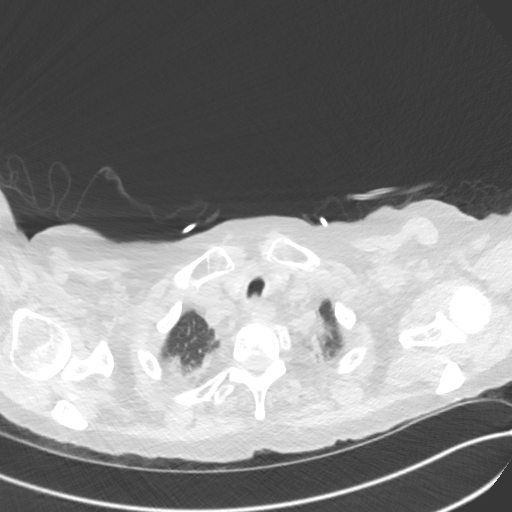
[im 112/121  lung]
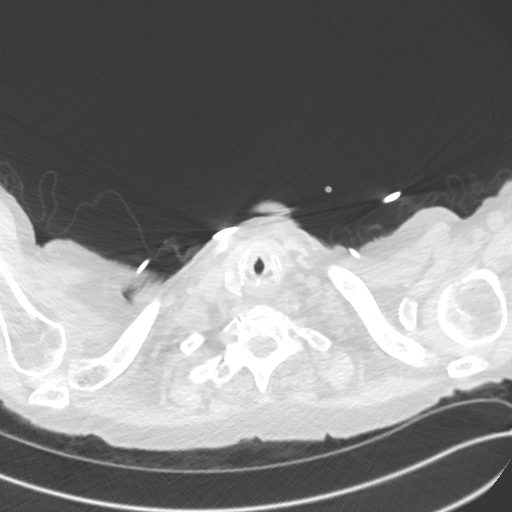

[Series 5: chest w/o 3mm st cor · coronal · non-contrast · 0.51mm/px · 3 of 101 slices shown]
[im 21/101  lung]
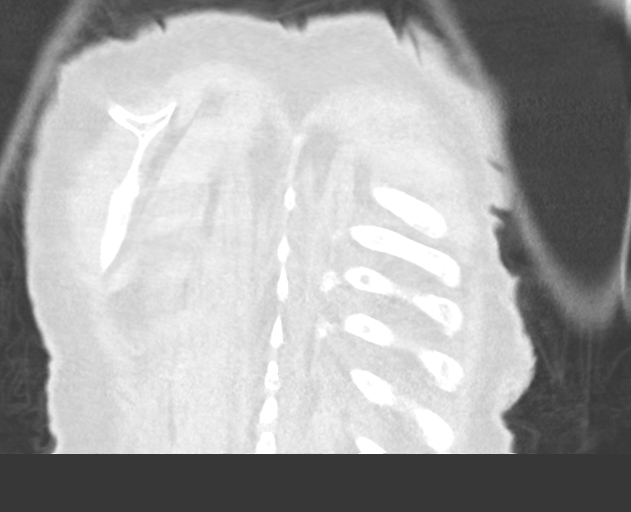
[im 41/101  lung]
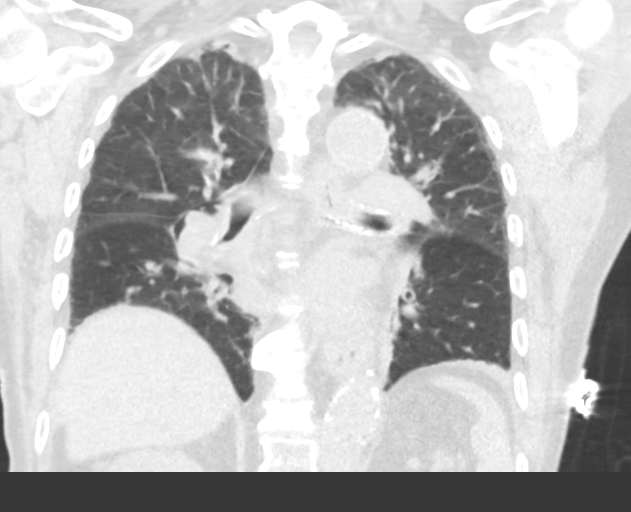
[im 61/101  lung]
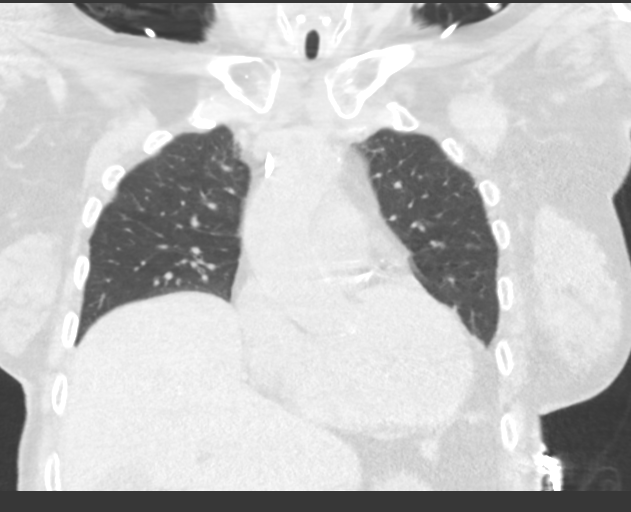

[15 of 36 positions shown; findings below may reference images not displayed]

FINDINGS: Cardiovascular: Coronary artery calcification and aortic
atherosclerotic calcification.

Mediastinum/Nodes: No axillary supraclavicular adenopathy. No
mediastinal hilar adenopathy. No pericardial fluid

Lungs/Pleura: Bronchial wall thickening in lower lobes. No airspace
consolidation. No pulmonary edema. No pneumothorax or nodularity.

Upper Abdomen: Limited view of the liver, kidneys, pancreas are
unremarkable. Normal adrenal glands.

Musculoskeletal: Compression fracture at T11. No peri spinal
hematoma to suggest acute fracture.
IMPRESSION: 1. Bronchial thickening in lower lobes suggest bronchitis.
2. No evidence pneumonia.
3. Compression fracture at T11 appears remote or subacute

Aortic Atherosclerosis ([id number]).

## 2019-05-01 DIAGNOSIS — I130 Hypertensive heart and chronic kidney disease with heart failure and stage 1 through stage 4 chronic kidney disease, or unspecified chronic kidney disease: Secondary | ICD-10-CM | POA: Diagnosis not present

## 2019-05-01 DIAGNOSIS — D631 Anemia in chronic kidney disease: Secondary | ICD-10-CM | POA: Diagnosis not present

## 2019-05-01 DIAGNOSIS — I509 Heart failure, unspecified: Secondary | ICD-10-CM | POA: Diagnosis not present

## 2019-05-01 DIAGNOSIS — I421 Obstructive hypertrophic cardiomyopathy: Secondary | ICD-10-CM | POA: Diagnosis not present

## 2019-05-01 DIAGNOSIS — F0390 Unspecified dementia without behavioral disturbance: Secondary | ICD-10-CM | POA: Diagnosis not present

## 2019-05-01 DIAGNOSIS — N183 Chronic kidney disease, stage 3 (moderate): Secondary | ICD-10-CM | POA: Diagnosis not present

## 2019-05-05 DIAGNOSIS — I130 Hypertensive heart and chronic kidney disease with heart failure and stage 1 through stage 4 chronic kidney disease, or unspecified chronic kidney disease: Secondary | ICD-10-CM | POA: Diagnosis not present

## 2019-05-05 DIAGNOSIS — N183 Chronic kidney disease, stage 3 (moderate): Secondary | ICD-10-CM | POA: Diagnosis not present

## 2019-05-05 DIAGNOSIS — F0390 Unspecified dementia without behavioral disturbance: Secondary | ICD-10-CM | POA: Diagnosis not present

## 2019-05-05 DIAGNOSIS — D631 Anemia in chronic kidney disease: Secondary | ICD-10-CM | POA: Diagnosis not present

## 2019-05-05 DIAGNOSIS — I509 Heart failure, unspecified: Secondary | ICD-10-CM | POA: Diagnosis not present

## 2019-05-05 DIAGNOSIS — I421 Obstructive hypertrophic cardiomyopathy: Secondary | ICD-10-CM | POA: Diagnosis not present

## 2019-05-06 DIAGNOSIS — F0390 Unspecified dementia without behavioral disturbance: Secondary | ICD-10-CM | POA: Diagnosis not present

## 2019-05-06 DIAGNOSIS — I700 Atherosclerosis of aorta: Secondary | ICD-10-CM | POA: Diagnosis not present

## 2019-05-06 DIAGNOSIS — I130 Hypertensive heart and chronic kidney disease with heart failure and stage 1 through stage 4 chronic kidney disease, or unspecified chronic kidney disease: Secondary | ICD-10-CM | POA: Diagnosis not present

## 2019-05-06 DIAGNOSIS — I509 Heart failure, unspecified: Secondary | ICD-10-CM | POA: Diagnosis not present

## 2019-05-06 DIAGNOSIS — R159 Full incontinence of feces: Secondary | ICD-10-CM | POA: Diagnosis not present

## 2019-05-06 DIAGNOSIS — Z87440 Personal history of urinary (tract) infections: Secondary | ICD-10-CM | POA: Diagnosis not present

## 2019-05-06 DIAGNOSIS — Z993 Dependence on wheelchair: Secondary | ICD-10-CM | POA: Diagnosis not present

## 2019-05-06 DIAGNOSIS — N183 Chronic kidney disease, stage 3 (moderate): Secondary | ICD-10-CM | POA: Diagnosis not present

## 2019-05-06 DIAGNOSIS — R32 Unspecified urinary incontinence: Secondary | ICD-10-CM | POA: Diagnosis not present

## 2019-05-06 DIAGNOSIS — I421 Obstructive hypertrophic cardiomyopathy: Secondary | ICD-10-CM | POA: Diagnosis not present

## 2019-05-06 DIAGNOSIS — N952 Postmenopausal atrophic vaginitis: Secondary | ICD-10-CM | POA: Diagnosis not present

## 2019-05-06 DIAGNOSIS — D631 Anemia in chronic kidney disease: Secondary | ICD-10-CM | POA: Diagnosis not present

## 2019-05-06 DIAGNOSIS — Z6821 Body mass index (BMI) 21.0-21.9, adult: Secondary | ICD-10-CM | POA: Diagnosis not present

## 2019-05-06 DIAGNOSIS — S81811D Laceration without foreign body, right lower leg, subsequent encounter: Secondary | ICD-10-CM | POA: Diagnosis not present

## 2019-05-06 DIAGNOSIS — R627 Adult failure to thrive: Secondary | ICD-10-CM | POA: Diagnosis not present

## 2019-05-13 DIAGNOSIS — N183 Chronic kidney disease, stage 3 (moderate): Secondary | ICD-10-CM | POA: Diagnosis not present

## 2019-05-13 DIAGNOSIS — I421 Obstructive hypertrophic cardiomyopathy: Secondary | ICD-10-CM | POA: Diagnosis not present

## 2019-05-13 DIAGNOSIS — F0390 Unspecified dementia without behavioral disturbance: Secondary | ICD-10-CM | POA: Diagnosis not present

## 2019-05-13 DIAGNOSIS — D631 Anemia in chronic kidney disease: Secondary | ICD-10-CM | POA: Diagnosis not present

## 2019-05-13 DIAGNOSIS — I509 Heart failure, unspecified: Secondary | ICD-10-CM | POA: Diagnosis not present

## 2019-05-13 DIAGNOSIS — I130 Hypertensive heart and chronic kidney disease with heart failure and stage 1 through stage 4 chronic kidney disease, or unspecified chronic kidney disease: Secondary | ICD-10-CM | POA: Diagnosis not present

## 2019-05-16 DIAGNOSIS — I421 Obstructive hypertrophic cardiomyopathy: Secondary | ICD-10-CM | POA: Diagnosis not present

## 2019-05-16 DIAGNOSIS — D631 Anemia in chronic kidney disease: Secondary | ICD-10-CM | POA: Diagnosis not present

## 2019-05-16 DIAGNOSIS — N183 Chronic kidney disease, stage 3 (moderate): Secondary | ICD-10-CM | POA: Diagnosis not present

## 2019-05-16 DIAGNOSIS — I130 Hypertensive heart and chronic kidney disease with heart failure and stage 1 through stage 4 chronic kidney disease, or unspecified chronic kidney disease: Secondary | ICD-10-CM | POA: Diagnosis not present

## 2019-05-16 DIAGNOSIS — I509 Heart failure, unspecified: Secondary | ICD-10-CM | POA: Diagnosis not present

## 2019-05-16 DIAGNOSIS — F0390 Unspecified dementia without behavioral disturbance: Secondary | ICD-10-CM | POA: Diagnosis not present

## 2019-05-19 DIAGNOSIS — I421 Obstructive hypertrophic cardiomyopathy: Secondary | ICD-10-CM | POA: Diagnosis not present

## 2019-05-19 DIAGNOSIS — D631 Anemia in chronic kidney disease: Secondary | ICD-10-CM | POA: Diagnosis not present

## 2019-05-19 DIAGNOSIS — I130 Hypertensive heart and chronic kidney disease with heart failure and stage 1 through stage 4 chronic kidney disease, or unspecified chronic kidney disease: Secondary | ICD-10-CM | POA: Diagnosis not present

## 2019-05-19 DIAGNOSIS — F0390 Unspecified dementia without behavioral disturbance: Secondary | ICD-10-CM | POA: Diagnosis not present

## 2019-05-19 DIAGNOSIS — I509 Heart failure, unspecified: Secondary | ICD-10-CM | POA: Diagnosis not present

## 2019-05-19 DIAGNOSIS — N183 Chronic kidney disease, stage 3 (moderate): Secondary | ICD-10-CM | POA: Diagnosis not present

## 2019-05-22 DIAGNOSIS — F0390 Unspecified dementia without behavioral disturbance: Secondary | ICD-10-CM | POA: Diagnosis not present

## 2019-05-22 DIAGNOSIS — N183 Chronic kidney disease, stage 3 (moderate): Secondary | ICD-10-CM | POA: Diagnosis not present

## 2019-05-22 DIAGNOSIS — I130 Hypertensive heart and chronic kidney disease with heart failure and stage 1 through stage 4 chronic kidney disease, or unspecified chronic kidney disease: Secondary | ICD-10-CM | POA: Diagnosis not present

## 2019-05-22 DIAGNOSIS — D631 Anemia in chronic kidney disease: Secondary | ICD-10-CM | POA: Diagnosis not present

## 2019-05-22 DIAGNOSIS — I509 Heart failure, unspecified: Secondary | ICD-10-CM | POA: Diagnosis not present

## 2019-05-22 DIAGNOSIS — I421 Obstructive hypertrophic cardiomyopathy: Secondary | ICD-10-CM | POA: Diagnosis not present

## 2019-05-23 DIAGNOSIS — I509 Heart failure, unspecified: Secondary | ICD-10-CM | POA: Diagnosis not present

## 2019-05-23 DIAGNOSIS — N183 Chronic kidney disease, stage 3 (moderate): Secondary | ICD-10-CM | POA: Diagnosis not present

## 2019-05-23 DIAGNOSIS — I130 Hypertensive heart and chronic kidney disease with heart failure and stage 1 through stage 4 chronic kidney disease, or unspecified chronic kidney disease: Secondary | ICD-10-CM | POA: Diagnosis not present

## 2019-05-23 DIAGNOSIS — F0390 Unspecified dementia without behavioral disturbance: Secondary | ICD-10-CM | POA: Diagnosis not present

## 2019-05-23 DIAGNOSIS — D631 Anemia in chronic kidney disease: Secondary | ICD-10-CM | POA: Diagnosis not present

## 2019-05-23 DIAGNOSIS — I421 Obstructive hypertrophic cardiomyopathy: Secondary | ICD-10-CM | POA: Diagnosis not present

## 2019-05-26 DIAGNOSIS — F0390 Unspecified dementia without behavioral disturbance: Secondary | ICD-10-CM | POA: Diagnosis not present

## 2019-05-26 DIAGNOSIS — N183 Chronic kidney disease, stage 3 (moderate): Secondary | ICD-10-CM | POA: Diagnosis not present

## 2019-05-26 DIAGNOSIS — I509 Heart failure, unspecified: Secondary | ICD-10-CM | POA: Diagnosis not present

## 2019-05-26 DIAGNOSIS — D631 Anemia in chronic kidney disease: Secondary | ICD-10-CM | POA: Diagnosis not present

## 2019-05-26 DIAGNOSIS — I130 Hypertensive heart and chronic kidney disease with heart failure and stage 1 through stage 4 chronic kidney disease, or unspecified chronic kidney disease: Secondary | ICD-10-CM | POA: Diagnosis not present

## 2019-05-26 DIAGNOSIS — I421 Obstructive hypertrophic cardiomyopathy: Secondary | ICD-10-CM | POA: Diagnosis not present

## 2019-05-27 DIAGNOSIS — I421 Obstructive hypertrophic cardiomyopathy: Secondary | ICD-10-CM | POA: Diagnosis not present

## 2019-05-27 DIAGNOSIS — I509 Heart failure, unspecified: Secondary | ICD-10-CM | POA: Diagnosis not present

## 2019-05-27 DIAGNOSIS — F0390 Unspecified dementia without behavioral disturbance: Secondary | ICD-10-CM | POA: Diagnosis not present

## 2019-05-27 DIAGNOSIS — N183 Chronic kidney disease, stage 3 (moderate): Secondary | ICD-10-CM | POA: Diagnosis not present

## 2019-05-27 DIAGNOSIS — D631 Anemia in chronic kidney disease: Secondary | ICD-10-CM | POA: Diagnosis not present

## 2019-05-27 DIAGNOSIS — I130 Hypertensive heart and chronic kidney disease with heart failure and stage 1 through stage 4 chronic kidney disease, or unspecified chronic kidney disease: Secondary | ICD-10-CM | POA: Diagnosis not present

## 2019-05-29 DIAGNOSIS — I130 Hypertensive heart and chronic kidney disease with heart failure and stage 1 through stage 4 chronic kidney disease, or unspecified chronic kidney disease: Secondary | ICD-10-CM | POA: Diagnosis not present

## 2019-05-29 DIAGNOSIS — N183 Chronic kidney disease, stage 3 (moderate): Secondary | ICD-10-CM | POA: Diagnosis not present

## 2019-05-29 DIAGNOSIS — I509 Heart failure, unspecified: Secondary | ICD-10-CM | POA: Diagnosis not present

## 2019-05-29 DIAGNOSIS — I421 Obstructive hypertrophic cardiomyopathy: Secondary | ICD-10-CM | POA: Diagnosis not present

## 2019-05-29 DIAGNOSIS — D631 Anemia in chronic kidney disease: Secondary | ICD-10-CM | POA: Diagnosis not present

## 2019-05-29 DIAGNOSIS — F0390 Unspecified dementia without behavioral disturbance: Secondary | ICD-10-CM | POA: Diagnosis not present

## 2019-06-02 DIAGNOSIS — N183 Chronic kidney disease, stage 3 (moderate): Secondary | ICD-10-CM | POA: Diagnosis not present

## 2019-06-02 DIAGNOSIS — I509 Heart failure, unspecified: Secondary | ICD-10-CM | POA: Diagnosis not present

## 2019-06-02 DIAGNOSIS — F0390 Unspecified dementia without behavioral disturbance: Secondary | ICD-10-CM | POA: Diagnosis not present

## 2019-06-02 DIAGNOSIS — I130 Hypertensive heart and chronic kidney disease with heart failure and stage 1 through stage 4 chronic kidney disease, or unspecified chronic kidney disease: Secondary | ICD-10-CM | POA: Diagnosis not present

## 2019-06-02 DIAGNOSIS — I421 Obstructive hypertrophic cardiomyopathy: Secondary | ICD-10-CM | POA: Diagnosis not present

## 2019-06-02 DIAGNOSIS — D631 Anemia in chronic kidney disease: Secondary | ICD-10-CM | POA: Diagnosis not present

## 2019-06-03 DIAGNOSIS — F0390 Unspecified dementia without behavioral disturbance: Secondary | ICD-10-CM | POA: Diagnosis not present

## 2019-06-03 DIAGNOSIS — I421 Obstructive hypertrophic cardiomyopathy: Secondary | ICD-10-CM | POA: Diagnosis not present

## 2019-06-03 DIAGNOSIS — I509 Heart failure, unspecified: Secondary | ICD-10-CM | POA: Diagnosis not present

## 2019-06-03 DIAGNOSIS — I130 Hypertensive heart and chronic kidney disease with heart failure and stage 1 through stage 4 chronic kidney disease, or unspecified chronic kidney disease: Secondary | ICD-10-CM | POA: Diagnosis not present

## 2019-06-03 DIAGNOSIS — N183 Chronic kidney disease, stage 3 (moderate): Secondary | ICD-10-CM | POA: Diagnosis not present

## 2019-06-03 DIAGNOSIS — D631 Anemia in chronic kidney disease: Secondary | ICD-10-CM | POA: Diagnosis not present

## 2019-06-04 DIAGNOSIS — N952 Postmenopausal atrophic vaginitis: Secondary | ICD-10-CM | POA: Diagnosis not present

## 2019-06-04 DIAGNOSIS — N183 Chronic kidney disease, stage 3 (moderate): Secondary | ICD-10-CM | POA: Diagnosis not present

## 2019-06-04 DIAGNOSIS — F0390 Unspecified dementia without behavioral disturbance: Secondary | ICD-10-CM | POA: Diagnosis not present

## 2019-06-04 DIAGNOSIS — I509 Heart failure, unspecified: Secondary | ICD-10-CM | POA: Diagnosis not present

## 2019-06-04 DIAGNOSIS — I421 Obstructive hypertrophic cardiomyopathy: Secondary | ICD-10-CM | POA: Diagnosis not present

## 2019-06-04 DIAGNOSIS — I130 Hypertensive heart and chronic kidney disease with heart failure and stage 1 through stage 4 chronic kidney disease, or unspecified chronic kidney disease: Secondary | ICD-10-CM | POA: Diagnosis not present

## 2019-06-04 DIAGNOSIS — B373 Candidiasis of vulva and vagina: Secondary | ICD-10-CM | POA: Diagnosis not present

## 2019-06-04 DIAGNOSIS — I10 Essential (primary) hypertension: Secondary | ICD-10-CM | POA: Diagnosis not present

## 2019-06-04 DIAGNOSIS — D631 Anemia in chronic kidney disease: Secondary | ICD-10-CM | POA: Diagnosis not present

## 2019-06-04 DIAGNOSIS — K5900 Constipation, unspecified: Secondary | ICD-10-CM | POA: Diagnosis not present

## 2019-06-05 DIAGNOSIS — N952 Postmenopausal atrophic vaginitis: Secondary | ICD-10-CM | POA: Diagnosis not present

## 2019-06-05 DIAGNOSIS — S81811D Laceration without foreign body, right lower leg, subsequent encounter: Secondary | ICD-10-CM | POA: Diagnosis not present

## 2019-06-05 DIAGNOSIS — Z6821 Body mass index (BMI) 21.0-21.9, adult: Secondary | ICD-10-CM | POA: Diagnosis not present

## 2019-06-05 DIAGNOSIS — N183 Chronic kidney disease, stage 3 unspecified: Secondary | ICD-10-CM | POA: Diagnosis not present

## 2019-06-05 DIAGNOSIS — D631 Anemia in chronic kidney disease: Secondary | ICD-10-CM | POA: Diagnosis not present

## 2019-06-05 DIAGNOSIS — R32 Unspecified urinary incontinence: Secondary | ICD-10-CM | POA: Diagnosis not present

## 2019-06-05 DIAGNOSIS — I130 Hypertensive heart and chronic kidney disease with heart failure and stage 1 through stage 4 chronic kidney disease, or unspecified chronic kidney disease: Secondary | ICD-10-CM | POA: Diagnosis not present

## 2019-06-05 DIAGNOSIS — F0390 Unspecified dementia without behavioral disturbance: Secondary | ICD-10-CM | POA: Diagnosis not present

## 2019-06-05 DIAGNOSIS — Z993 Dependence on wheelchair: Secondary | ICD-10-CM | POA: Diagnosis not present

## 2019-06-05 DIAGNOSIS — R159 Full incontinence of feces: Secondary | ICD-10-CM | POA: Diagnosis not present

## 2019-06-05 DIAGNOSIS — Z87440 Personal history of urinary (tract) infections: Secondary | ICD-10-CM | POA: Diagnosis not present

## 2019-06-05 DIAGNOSIS — R627 Adult failure to thrive: Secondary | ICD-10-CM | POA: Diagnosis not present

## 2019-06-05 DIAGNOSIS — I700 Atherosclerosis of aorta: Secondary | ICD-10-CM | POA: Diagnosis not present

## 2019-06-05 DIAGNOSIS — I421 Obstructive hypertrophic cardiomyopathy: Secondary | ICD-10-CM | POA: Diagnosis not present

## 2019-06-05 DIAGNOSIS — I509 Heart failure, unspecified: Secondary | ICD-10-CM | POA: Diagnosis not present

## 2019-06-09 DIAGNOSIS — I130 Hypertensive heart and chronic kidney disease with heart failure and stage 1 through stage 4 chronic kidney disease, or unspecified chronic kidney disease: Secondary | ICD-10-CM | POA: Diagnosis not present

## 2019-06-09 DIAGNOSIS — I421 Obstructive hypertrophic cardiomyopathy: Secondary | ICD-10-CM | POA: Diagnosis not present

## 2019-06-09 DIAGNOSIS — I509 Heart failure, unspecified: Secondary | ICD-10-CM | POA: Diagnosis not present

## 2019-06-09 DIAGNOSIS — N183 Chronic kidney disease, stage 3 unspecified: Secondary | ICD-10-CM | POA: Diagnosis not present

## 2019-06-09 DIAGNOSIS — F0390 Unspecified dementia without behavioral disturbance: Secondary | ICD-10-CM | POA: Diagnosis not present

## 2019-06-09 DIAGNOSIS — D631 Anemia in chronic kidney disease: Secondary | ICD-10-CM | POA: Diagnosis not present

## 2019-06-10 DIAGNOSIS — D631 Anemia in chronic kidney disease: Secondary | ICD-10-CM | POA: Diagnosis not present

## 2019-06-10 DIAGNOSIS — I509 Heart failure, unspecified: Secondary | ICD-10-CM | POA: Diagnosis not present

## 2019-06-10 DIAGNOSIS — F0390 Unspecified dementia without behavioral disturbance: Secondary | ICD-10-CM | POA: Diagnosis not present

## 2019-06-10 DIAGNOSIS — N183 Chronic kidney disease, stage 3 unspecified: Secondary | ICD-10-CM | POA: Diagnosis not present

## 2019-06-10 DIAGNOSIS — I421 Obstructive hypertrophic cardiomyopathy: Secondary | ICD-10-CM | POA: Diagnosis not present

## 2019-06-10 DIAGNOSIS — I130 Hypertensive heart and chronic kidney disease with heart failure and stage 1 through stage 4 chronic kidney disease, or unspecified chronic kidney disease: Secondary | ICD-10-CM | POA: Diagnosis not present

## 2019-06-12 DIAGNOSIS — I130 Hypertensive heart and chronic kidney disease with heart failure and stage 1 through stage 4 chronic kidney disease, or unspecified chronic kidney disease: Secondary | ICD-10-CM | POA: Diagnosis not present

## 2019-06-12 DIAGNOSIS — F0390 Unspecified dementia without behavioral disturbance: Secondary | ICD-10-CM | POA: Diagnosis not present

## 2019-06-12 DIAGNOSIS — N183 Chronic kidney disease, stage 3 unspecified: Secondary | ICD-10-CM | POA: Diagnosis not present

## 2019-06-12 DIAGNOSIS — I509 Heart failure, unspecified: Secondary | ICD-10-CM | POA: Diagnosis not present

## 2019-06-12 DIAGNOSIS — I421 Obstructive hypertrophic cardiomyopathy: Secondary | ICD-10-CM | POA: Diagnosis not present

## 2019-06-12 DIAGNOSIS — D631 Anemia in chronic kidney disease: Secondary | ICD-10-CM | POA: Diagnosis not present

## 2019-06-16 DIAGNOSIS — D631 Anemia in chronic kidney disease: Secondary | ICD-10-CM | POA: Diagnosis not present

## 2019-06-16 DIAGNOSIS — I509 Heart failure, unspecified: Secondary | ICD-10-CM | POA: Diagnosis not present

## 2019-06-16 DIAGNOSIS — I421 Obstructive hypertrophic cardiomyopathy: Secondary | ICD-10-CM | POA: Diagnosis not present

## 2019-06-16 DIAGNOSIS — F0390 Unspecified dementia without behavioral disturbance: Secondary | ICD-10-CM | POA: Diagnosis not present

## 2019-06-16 DIAGNOSIS — I130 Hypertensive heart and chronic kidney disease with heart failure and stage 1 through stage 4 chronic kidney disease, or unspecified chronic kidney disease: Secondary | ICD-10-CM | POA: Diagnosis not present

## 2019-06-16 DIAGNOSIS — N183 Chronic kidney disease, stage 3 unspecified: Secondary | ICD-10-CM | POA: Diagnosis not present

## 2019-06-17 DIAGNOSIS — I509 Heart failure, unspecified: Secondary | ICD-10-CM | POA: Diagnosis not present

## 2019-06-17 DIAGNOSIS — D631 Anemia in chronic kidney disease: Secondary | ICD-10-CM | POA: Diagnosis not present

## 2019-06-17 DIAGNOSIS — I130 Hypertensive heart and chronic kidney disease with heart failure and stage 1 through stage 4 chronic kidney disease, or unspecified chronic kidney disease: Secondary | ICD-10-CM | POA: Diagnosis not present

## 2019-06-17 DIAGNOSIS — I421 Obstructive hypertrophic cardiomyopathy: Secondary | ICD-10-CM | POA: Diagnosis not present

## 2019-06-17 DIAGNOSIS — F0390 Unspecified dementia without behavioral disturbance: Secondary | ICD-10-CM | POA: Diagnosis not present

## 2019-06-17 DIAGNOSIS — N183 Chronic kidney disease, stage 3 unspecified: Secondary | ICD-10-CM | POA: Diagnosis not present

## 2019-06-19 DIAGNOSIS — D631 Anemia in chronic kidney disease: Secondary | ICD-10-CM | POA: Diagnosis not present

## 2019-06-19 DIAGNOSIS — I130 Hypertensive heart and chronic kidney disease with heart failure and stage 1 through stage 4 chronic kidney disease, or unspecified chronic kidney disease: Secondary | ICD-10-CM | POA: Diagnosis not present

## 2019-06-19 DIAGNOSIS — I421 Obstructive hypertrophic cardiomyopathy: Secondary | ICD-10-CM | POA: Diagnosis not present

## 2019-06-19 DIAGNOSIS — I509 Heart failure, unspecified: Secondary | ICD-10-CM | POA: Diagnosis not present

## 2019-06-19 DIAGNOSIS — F0390 Unspecified dementia without behavioral disturbance: Secondary | ICD-10-CM | POA: Diagnosis not present

## 2019-06-19 DIAGNOSIS — N183 Chronic kidney disease, stage 3 unspecified: Secondary | ICD-10-CM | POA: Diagnosis not present

## 2019-06-23 DIAGNOSIS — I130 Hypertensive heart and chronic kidney disease with heart failure and stage 1 through stage 4 chronic kidney disease, or unspecified chronic kidney disease: Secondary | ICD-10-CM | POA: Diagnosis not present

## 2019-06-23 DIAGNOSIS — D631 Anemia in chronic kidney disease: Secondary | ICD-10-CM | POA: Diagnosis not present

## 2019-06-23 DIAGNOSIS — I509 Heart failure, unspecified: Secondary | ICD-10-CM | POA: Diagnosis not present

## 2019-06-23 DIAGNOSIS — I421 Obstructive hypertrophic cardiomyopathy: Secondary | ICD-10-CM | POA: Diagnosis not present

## 2019-06-23 DIAGNOSIS — N183 Chronic kidney disease, stage 3 unspecified: Secondary | ICD-10-CM | POA: Diagnosis not present

## 2019-06-23 DIAGNOSIS — F0390 Unspecified dementia without behavioral disturbance: Secondary | ICD-10-CM | POA: Diagnosis not present

## 2019-06-26 DIAGNOSIS — I509 Heart failure, unspecified: Secondary | ICD-10-CM | POA: Diagnosis not present

## 2019-06-26 DIAGNOSIS — F0390 Unspecified dementia without behavioral disturbance: Secondary | ICD-10-CM | POA: Diagnosis not present

## 2019-06-26 DIAGNOSIS — I421 Obstructive hypertrophic cardiomyopathy: Secondary | ICD-10-CM | POA: Diagnosis not present

## 2019-06-26 DIAGNOSIS — I130 Hypertensive heart and chronic kidney disease with heart failure and stage 1 through stage 4 chronic kidney disease, or unspecified chronic kidney disease: Secondary | ICD-10-CM | POA: Diagnosis not present

## 2019-06-26 DIAGNOSIS — D631 Anemia in chronic kidney disease: Secondary | ICD-10-CM | POA: Diagnosis not present

## 2019-06-26 DIAGNOSIS — N183 Chronic kidney disease, stage 3 unspecified: Secondary | ICD-10-CM | POA: Diagnosis not present

## 2019-06-30 DIAGNOSIS — N183 Chronic kidney disease, stage 3 unspecified: Secondary | ICD-10-CM | POA: Diagnosis not present

## 2019-06-30 DIAGNOSIS — I509 Heart failure, unspecified: Secondary | ICD-10-CM | POA: Diagnosis not present

## 2019-06-30 DIAGNOSIS — I421 Obstructive hypertrophic cardiomyopathy: Secondary | ICD-10-CM | POA: Diagnosis not present

## 2019-06-30 DIAGNOSIS — D631 Anemia in chronic kidney disease: Secondary | ICD-10-CM | POA: Diagnosis not present

## 2019-06-30 DIAGNOSIS — I130 Hypertensive heart and chronic kidney disease with heart failure and stage 1 through stage 4 chronic kidney disease, or unspecified chronic kidney disease: Secondary | ICD-10-CM | POA: Diagnosis not present

## 2019-06-30 DIAGNOSIS — F0390 Unspecified dementia without behavioral disturbance: Secondary | ICD-10-CM | POA: Diagnosis not present

## 2019-07-01 DIAGNOSIS — N183 Chronic kidney disease, stage 3 unspecified: Secondary | ICD-10-CM | POA: Diagnosis not present

## 2019-07-01 DIAGNOSIS — D631 Anemia in chronic kidney disease: Secondary | ICD-10-CM | POA: Diagnosis not present

## 2019-07-01 DIAGNOSIS — I130 Hypertensive heart and chronic kidney disease with heart failure and stage 1 through stage 4 chronic kidney disease, or unspecified chronic kidney disease: Secondary | ICD-10-CM | POA: Diagnosis not present

## 2019-07-01 DIAGNOSIS — I509 Heart failure, unspecified: Secondary | ICD-10-CM | POA: Diagnosis not present

## 2019-07-01 DIAGNOSIS — I421 Obstructive hypertrophic cardiomyopathy: Secondary | ICD-10-CM | POA: Diagnosis not present

## 2019-07-01 DIAGNOSIS — F0390 Unspecified dementia without behavioral disturbance: Secondary | ICD-10-CM | POA: Diagnosis not present

## 2019-07-02 DIAGNOSIS — I130 Hypertensive heart and chronic kidney disease with heart failure and stage 1 through stage 4 chronic kidney disease, or unspecified chronic kidney disease: Secondary | ICD-10-CM | POA: Diagnosis not present

## 2019-07-02 DIAGNOSIS — D631 Anemia in chronic kidney disease: Secondary | ICD-10-CM | POA: Diagnosis not present

## 2019-07-02 DIAGNOSIS — F0390 Unspecified dementia without behavioral disturbance: Secondary | ICD-10-CM | POA: Diagnosis not present

## 2019-07-02 DIAGNOSIS — I421 Obstructive hypertrophic cardiomyopathy: Secondary | ICD-10-CM | POA: Diagnosis not present

## 2019-07-02 DIAGNOSIS — I509 Heart failure, unspecified: Secondary | ICD-10-CM | POA: Diagnosis not present

## 2019-07-02 DIAGNOSIS — N183 Chronic kidney disease, stage 3 unspecified: Secondary | ICD-10-CM | POA: Diagnosis not present

## 2019-07-03 DIAGNOSIS — I509 Heart failure, unspecified: Secondary | ICD-10-CM | POA: Diagnosis not present

## 2019-07-03 DIAGNOSIS — I130 Hypertensive heart and chronic kidney disease with heart failure and stage 1 through stage 4 chronic kidney disease, or unspecified chronic kidney disease: Secondary | ICD-10-CM | POA: Diagnosis not present

## 2019-07-03 DIAGNOSIS — N183 Chronic kidney disease, stage 3 unspecified: Secondary | ICD-10-CM | POA: Diagnosis not present

## 2019-07-03 DIAGNOSIS — D631 Anemia in chronic kidney disease: Secondary | ICD-10-CM | POA: Diagnosis not present

## 2019-07-03 DIAGNOSIS — I421 Obstructive hypertrophic cardiomyopathy: Secondary | ICD-10-CM | POA: Diagnosis not present

## 2019-07-03 DIAGNOSIS — F0390 Unspecified dementia without behavioral disturbance: Secondary | ICD-10-CM | POA: Diagnosis not present

## 2019-07-06 DIAGNOSIS — D631 Anemia in chronic kidney disease: Secondary | ICD-10-CM | POA: Diagnosis not present

## 2019-07-06 DIAGNOSIS — Z6821 Body mass index (BMI) 21.0-21.9, adult: Secondary | ICD-10-CM | POA: Diagnosis not present

## 2019-07-06 DIAGNOSIS — Z87440 Personal history of urinary (tract) infections: Secondary | ICD-10-CM | POA: Diagnosis not present

## 2019-07-06 DIAGNOSIS — R627 Adult failure to thrive: Secondary | ICD-10-CM | POA: Diagnosis not present

## 2019-07-06 DIAGNOSIS — I421 Obstructive hypertrophic cardiomyopathy: Secondary | ICD-10-CM | POA: Diagnosis not present

## 2019-07-06 DIAGNOSIS — N952 Postmenopausal atrophic vaginitis: Secondary | ICD-10-CM | POA: Diagnosis not present

## 2019-07-06 DIAGNOSIS — Z993 Dependence on wheelchair: Secondary | ICD-10-CM | POA: Diagnosis not present

## 2019-07-06 DIAGNOSIS — I130 Hypertensive heart and chronic kidney disease with heart failure and stage 1 through stage 4 chronic kidney disease, or unspecified chronic kidney disease: Secondary | ICD-10-CM | POA: Diagnosis not present

## 2019-07-06 DIAGNOSIS — F0390 Unspecified dementia without behavioral disturbance: Secondary | ICD-10-CM | POA: Diagnosis not present

## 2019-07-06 DIAGNOSIS — R159 Full incontinence of feces: Secondary | ICD-10-CM | POA: Diagnosis not present

## 2019-07-06 DIAGNOSIS — S81811D Laceration without foreign body, right lower leg, subsequent encounter: Secondary | ICD-10-CM | POA: Diagnosis not present

## 2019-07-06 DIAGNOSIS — I700 Atherosclerosis of aorta: Secondary | ICD-10-CM | POA: Diagnosis not present

## 2019-07-06 DIAGNOSIS — R32 Unspecified urinary incontinence: Secondary | ICD-10-CM | POA: Diagnosis not present

## 2019-07-06 DIAGNOSIS — N183 Chronic kidney disease, stage 3 unspecified: Secondary | ICD-10-CM | POA: Diagnosis not present

## 2019-07-06 DIAGNOSIS — I509 Heart failure, unspecified: Secondary | ICD-10-CM | POA: Diagnosis not present

## 2019-07-08 DIAGNOSIS — N183 Chronic kidney disease, stage 3 unspecified: Secondary | ICD-10-CM | POA: Diagnosis not present

## 2019-07-08 DIAGNOSIS — F0390 Unspecified dementia without behavioral disturbance: Secondary | ICD-10-CM | POA: Diagnosis not present

## 2019-07-08 DIAGNOSIS — I421 Obstructive hypertrophic cardiomyopathy: Secondary | ICD-10-CM | POA: Diagnosis not present

## 2019-07-08 DIAGNOSIS — I130 Hypertensive heart and chronic kidney disease with heart failure and stage 1 through stage 4 chronic kidney disease, or unspecified chronic kidney disease: Secondary | ICD-10-CM | POA: Diagnosis not present

## 2019-07-08 DIAGNOSIS — I509 Heart failure, unspecified: Secondary | ICD-10-CM | POA: Diagnosis not present

## 2019-07-08 DIAGNOSIS — D631 Anemia in chronic kidney disease: Secondary | ICD-10-CM | POA: Diagnosis not present

## 2019-07-09 DIAGNOSIS — F0390 Unspecified dementia without behavioral disturbance: Secondary | ICD-10-CM | POA: Diagnosis not present

## 2019-07-09 DIAGNOSIS — N183 Chronic kidney disease, stage 3 unspecified: Secondary | ICD-10-CM | POA: Diagnosis not present

## 2019-07-09 DIAGNOSIS — D631 Anemia in chronic kidney disease: Secondary | ICD-10-CM | POA: Diagnosis not present

## 2019-07-09 DIAGNOSIS — I130 Hypertensive heart and chronic kidney disease with heart failure and stage 1 through stage 4 chronic kidney disease, or unspecified chronic kidney disease: Secondary | ICD-10-CM | POA: Diagnosis not present

## 2019-07-09 DIAGNOSIS — I421 Obstructive hypertrophic cardiomyopathy: Secondary | ICD-10-CM | POA: Diagnosis not present

## 2019-07-09 DIAGNOSIS — I509 Heart failure, unspecified: Secondary | ICD-10-CM | POA: Diagnosis not present

## 2019-07-10 DIAGNOSIS — D631 Anemia in chronic kidney disease: Secondary | ICD-10-CM | POA: Diagnosis not present

## 2019-07-10 DIAGNOSIS — I130 Hypertensive heart and chronic kidney disease with heart failure and stage 1 through stage 4 chronic kidney disease, or unspecified chronic kidney disease: Secondary | ICD-10-CM | POA: Diagnosis not present

## 2019-07-10 DIAGNOSIS — I421 Obstructive hypertrophic cardiomyopathy: Secondary | ICD-10-CM | POA: Diagnosis not present

## 2019-07-10 DIAGNOSIS — F0390 Unspecified dementia without behavioral disturbance: Secondary | ICD-10-CM | POA: Diagnosis not present

## 2019-07-10 DIAGNOSIS — N183 Chronic kidney disease, stage 3 unspecified: Secondary | ICD-10-CM | POA: Diagnosis not present

## 2019-07-10 DIAGNOSIS — I509 Heart failure, unspecified: Secondary | ICD-10-CM | POA: Diagnosis not present

## 2019-07-15 DIAGNOSIS — D631 Anemia in chronic kidney disease: Secondary | ICD-10-CM | POA: Diagnosis not present

## 2019-07-15 DIAGNOSIS — F0390 Unspecified dementia without behavioral disturbance: Secondary | ICD-10-CM | POA: Diagnosis not present

## 2019-07-15 DIAGNOSIS — I509 Heart failure, unspecified: Secondary | ICD-10-CM | POA: Diagnosis not present

## 2019-07-15 DIAGNOSIS — I421 Obstructive hypertrophic cardiomyopathy: Secondary | ICD-10-CM | POA: Diagnosis not present

## 2019-07-15 DIAGNOSIS — I130 Hypertensive heart and chronic kidney disease with heart failure and stage 1 through stage 4 chronic kidney disease, or unspecified chronic kidney disease: Secondary | ICD-10-CM | POA: Diagnosis not present

## 2019-07-15 DIAGNOSIS — N183 Chronic kidney disease, stage 3 unspecified: Secondary | ICD-10-CM | POA: Diagnosis not present

## 2019-07-17 DIAGNOSIS — I130 Hypertensive heart and chronic kidney disease with heart failure and stage 1 through stage 4 chronic kidney disease, or unspecified chronic kidney disease: Secondary | ICD-10-CM | POA: Diagnosis not present

## 2019-07-17 DIAGNOSIS — D631 Anemia in chronic kidney disease: Secondary | ICD-10-CM | POA: Diagnosis not present

## 2019-07-17 DIAGNOSIS — I421 Obstructive hypertrophic cardiomyopathy: Secondary | ICD-10-CM | POA: Diagnosis not present

## 2019-07-17 DIAGNOSIS — F0390 Unspecified dementia without behavioral disturbance: Secondary | ICD-10-CM | POA: Diagnosis not present

## 2019-07-17 DIAGNOSIS — I509 Heart failure, unspecified: Secondary | ICD-10-CM | POA: Diagnosis not present

## 2019-07-17 DIAGNOSIS — N183 Chronic kidney disease, stage 3 unspecified: Secondary | ICD-10-CM | POA: Diagnosis not present

## 2019-07-22 DIAGNOSIS — I130 Hypertensive heart and chronic kidney disease with heart failure and stage 1 through stage 4 chronic kidney disease, or unspecified chronic kidney disease: Secondary | ICD-10-CM | POA: Diagnosis not present

## 2019-07-22 DIAGNOSIS — D631 Anemia in chronic kidney disease: Secondary | ICD-10-CM | POA: Diagnosis not present

## 2019-07-22 DIAGNOSIS — I421 Obstructive hypertrophic cardiomyopathy: Secondary | ICD-10-CM | POA: Diagnosis not present

## 2019-07-22 DIAGNOSIS — I509 Heart failure, unspecified: Secondary | ICD-10-CM | POA: Diagnosis not present

## 2019-07-22 DIAGNOSIS — F0390 Unspecified dementia without behavioral disturbance: Secondary | ICD-10-CM | POA: Diagnosis not present

## 2019-07-22 DIAGNOSIS — N183 Chronic kidney disease, stage 3 unspecified: Secondary | ICD-10-CM | POA: Diagnosis not present

## 2019-07-23 DIAGNOSIS — N183 Chronic kidney disease, stage 3 unspecified: Secondary | ICD-10-CM | POA: Diagnosis not present

## 2019-07-23 DIAGNOSIS — F0390 Unspecified dementia without behavioral disturbance: Secondary | ICD-10-CM | POA: Diagnosis not present

## 2019-07-23 DIAGNOSIS — D631 Anemia in chronic kidney disease: Secondary | ICD-10-CM | POA: Diagnosis not present

## 2019-07-23 DIAGNOSIS — I130 Hypertensive heart and chronic kidney disease with heart failure and stage 1 through stage 4 chronic kidney disease, or unspecified chronic kidney disease: Secondary | ICD-10-CM | POA: Diagnosis not present

## 2019-07-23 DIAGNOSIS — I421 Obstructive hypertrophic cardiomyopathy: Secondary | ICD-10-CM | POA: Diagnosis not present

## 2019-07-23 DIAGNOSIS — I509 Heart failure, unspecified: Secondary | ICD-10-CM | POA: Diagnosis not present

## 2019-07-24 DIAGNOSIS — I509 Heart failure, unspecified: Secondary | ICD-10-CM | POA: Diagnosis not present

## 2019-07-24 DIAGNOSIS — D631 Anemia in chronic kidney disease: Secondary | ICD-10-CM | POA: Diagnosis not present

## 2019-07-24 DIAGNOSIS — I421 Obstructive hypertrophic cardiomyopathy: Secondary | ICD-10-CM | POA: Diagnosis not present

## 2019-07-24 DIAGNOSIS — I130 Hypertensive heart and chronic kidney disease with heart failure and stage 1 through stage 4 chronic kidney disease, or unspecified chronic kidney disease: Secondary | ICD-10-CM | POA: Diagnosis not present

## 2019-07-24 DIAGNOSIS — N183 Chronic kidney disease, stage 3 unspecified: Secondary | ICD-10-CM | POA: Diagnosis not present

## 2019-07-24 DIAGNOSIS — F0390 Unspecified dementia without behavioral disturbance: Secondary | ICD-10-CM | POA: Diagnosis not present

## 2019-07-29 DIAGNOSIS — I130 Hypertensive heart and chronic kidney disease with heart failure and stage 1 through stage 4 chronic kidney disease, or unspecified chronic kidney disease: Secondary | ICD-10-CM | POA: Diagnosis not present

## 2019-07-29 DIAGNOSIS — I421 Obstructive hypertrophic cardiomyopathy: Secondary | ICD-10-CM | POA: Diagnosis not present

## 2019-07-29 DIAGNOSIS — F0390 Unspecified dementia without behavioral disturbance: Secondary | ICD-10-CM | POA: Diagnosis not present

## 2019-07-29 DIAGNOSIS — I509 Heart failure, unspecified: Secondary | ICD-10-CM | POA: Diagnosis not present

## 2019-07-29 DIAGNOSIS — D631 Anemia in chronic kidney disease: Secondary | ICD-10-CM | POA: Diagnosis not present

## 2019-07-29 DIAGNOSIS — N183 Chronic kidney disease, stage 3 unspecified: Secondary | ICD-10-CM | POA: Diagnosis not present

## 2019-08-04 DIAGNOSIS — D631 Anemia in chronic kidney disease: Secondary | ICD-10-CM | POA: Diagnosis not present

## 2019-08-04 DIAGNOSIS — I509 Heart failure, unspecified: Secondary | ICD-10-CM | POA: Diagnosis not present

## 2019-08-04 DIAGNOSIS — N183 Chronic kidney disease, stage 3 unspecified: Secondary | ICD-10-CM | POA: Diagnosis not present

## 2019-08-04 DIAGNOSIS — I421 Obstructive hypertrophic cardiomyopathy: Secondary | ICD-10-CM | POA: Diagnosis not present

## 2019-08-04 DIAGNOSIS — F0390 Unspecified dementia without behavioral disturbance: Secondary | ICD-10-CM | POA: Diagnosis not present

## 2019-08-04 DIAGNOSIS — I130 Hypertensive heart and chronic kidney disease with heart failure and stage 1 through stage 4 chronic kidney disease, or unspecified chronic kidney disease: Secondary | ICD-10-CM | POA: Diagnosis not present

## 2019-08-05 DIAGNOSIS — R627 Adult failure to thrive: Secondary | ICD-10-CM | POA: Diagnosis not present

## 2019-08-05 DIAGNOSIS — D631 Anemia in chronic kidney disease: Secondary | ICD-10-CM | POA: Diagnosis not present

## 2019-08-05 DIAGNOSIS — Z6821 Body mass index (BMI) 21.0-21.9, adult: Secondary | ICD-10-CM | POA: Diagnosis not present

## 2019-08-05 DIAGNOSIS — S81811D Laceration without foreign body, right lower leg, subsequent encounter: Secondary | ICD-10-CM | POA: Diagnosis not present

## 2019-08-05 DIAGNOSIS — I509 Heart failure, unspecified: Secondary | ICD-10-CM | POA: Diagnosis not present

## 2019-08-05 DIAGNOSIS — R159 Full incontinence of feces: Secondary | ICD-10-CM | POA: Diagnosis not present

## 2019-08-05 DIAGNOSIS — I130 Hypertensive heart and chronic kidney disease with heart failure and stage 1 through stage 4 chronic kidney disease, or unspecified chronic kidney disease: Secondary | ICD-10-CM | POA: Diagnosis not present

## 2019-08-05 DIAGNOSIS — I421 Obstructive hypertrophic cardiomyopathy: Secondary | ICD-10-CM | POA: Diagnosis not present

## 2019-08-05 DIAGNOSIS — I700 Atherosclerosis of aorta: Secondary | ICD-10-CM | POA: Diagnosis not present

## 2019-08-05 DIAGNOSIS — R32 Unspecified urinary incontinence: Secondary | ICD-10-CM | POA: Diagnosis not present

## 2019-08-05 DIAGNOSIS — N183 Chronic kidney disease, stage 3 unspecified: Secondary | ICD-10-CM | POA: Diagnosis not present

## 2019-08-05 DIAGNOSIS — Z993 Dependence on wheelchair: Secondary | ICD-10-CM | POA: Diagnosis not present

## 2019-08-05 DIAGNOSIS — Z87440 Personal history of urinary (tract) infections: Secondary | ICD-10-CM | POA: Diagnosis not present

## 2019-08-05 DIAGNOSIS — N952 Postmenopausal atrophic vaginitis: Secondary | ICD-10-CM | POA: Diagnosis not present

## 2019-08-05 DIAGNOSIS — F0390 Unspecified dementia without behavioral disturbance: Secondary | ICD-10-CM | POA: Diagnosis not present

## 2019-08-07 DIAGNOSIS — I509 Heart failure, unspecified: Secondary | ICD-10-CM | POA: Diagnosis not present

## 2019-08-07 DIAGNOSIS — N183 Chronic kidney disease, stage 3 unspecified: Secondary | ICD-10-CM | POA: Diagnosis not present

## 2019-08-07 DIAGNOSIS — I421 Obstructive hypertrophic cardiomyopathy: Secondary | ICD-10-CM | POA: Diagnosis not present

## 2019-08-07 DIAGNOSIS — I130 Hypertensive heart and chronic kidney disease with heart failure and stage 1 through stage 4 chronic kidney disease, or unspecified chronic kidney disease: Secondary | ICD-10-CM | POA: Diagnosis not present

## 2019-08-07 DIAGNOSIS — F0390 Unspecified dementia without behavioral disturbance: Secondary | ICD-10-CM | POA: Diagnosis not present

## 2019-08-07 DIAGNOSIS — Z20828 Contact with and (suspected) exposure to other viral communicable diseases: Secondary | ICD-10-CM | POA: Diagnosis not present

## 2019-08-07 DIAGNOSIS — D631 Anemia in chronic kidney disease: Secondary | ICD-10-CM | POA: Diagnosis not present

## 2019-08-11 DIAGNOSIS — I421 Obstructive hypertrophic cardiomyopathy: Secondary | ICD-10-CM | POA: Diagnosis not present

## 2019-08-11 DIAGNOSIS — F0390 Unspecified dementia without behavioral disturbance: Secondary | ICD-10-CM | POA: Diagnosis not present

## 2019-08-11 DIAGNOSIS — N183 Chronic kidney disease, stage 3 unspecified: Secondary | ICD-10-CM | POA: Diagnosis not present

## 2019-08-11 DIAGNOSIS — I130 Hypertensive heart and chronic kidney disease with heart failure and stage 1 through stage 4 chronic kidney disease, or unspecified chronic kidney disease: Secondary | ICD-10-CM | POA: Diagnosis not present

## 2019-08-11 DIAGNOSIS — D631 Anemia in chronic kidney disease: Secondary | ICD-10-CM | POA: Diagnosis not present

## 2019-08-11 DIAGNOSIS — I509 Heart failure, unspecified: Secondary | ICD-10-CM | POA: Diagnosis not present

## 2019-08-12 DIAGNOSIS — I509 Heart failure, unspecified: Secondary | ICD-10-CM | POA: Diagnosis not present

## 2019-08-12 DIAGNOSIS — I130 Hypertensive heart and chronic kidney disease with heart failure and stage 1 through stage 4 chronic kidney disease, or unspecified chronic kidney disease: Secondary | ICD-10-CM | POA: Diagnosis not present

## 2019-08-12 DIAGNOSIS — F0390 Unspecified dementia without behavioral disturbance: Secondary | ICD-10-CM | POA: Diagnosis not present

## 2019-08-12 DIAGNOSIS — D631 Anemia in chronic kidney disease: Secondary | ICD-10-CM | POA: Diagnosis not present

## 2019-08-12 DIAGNOSIS — Z20828 Contact with and (suspected) exposure to other viral communicable diseases: Secondary | ICD-10-CM | POA: Diagnosis not present

## 2019-08-12 DIAGNOSIS — I421 Obstructive hypertrophic cardiomyopathy: Secondary | ICD-10-CM | POA: Diagnosis not present

## 2019-08-12 DIAGNOSIS — N183 Chronic kidney disease, stage 3 unspecified: Secondary | ICD-10-CM | POA: Diagnosis not present

## 2019-08-13 DIAGNOSIS — I509 Heart failure, unspecified: Secondary | ICD-10-CM | POA: Diagnosis not present

## 2019-08-13 DIAGNOSIS — I421 Obstructive hypertrophic cardiomyopathy: Secondary | ICD-10-CM | POA: Diagnosis not present

## 2019-08-13 DIAGNOSIS — N183 Chronic kidney disease, stage 3 unspecified: Secondary | ICD-10-CM | POA: Diagnosis not present

## 2019-08-13 DIAGNOSIS — D631 Anemia in chronic kidney disease: Secondary | ICD-10-CM | POA: Diagnosis not present

## 2019-08-13 DIAGNOSIS — I130 Hypertensive heart and chronic kidney disease with heart failure and stage 1 through stage 4 chronic kidney disease, or unspecified chronic kidney disease: Secondary | ICD-10-CM | POA: Diagnosis not present

## 2019-08-13 DIAGNOSIS — F0390 Unspecified dementia without behavioral disturbance: Secondary | ICD-10-CM | POA: Diagnosis not present

## 2019-08-14 DIAGNOSIS — D631 Anemia in chronic kidney disease: Secondary | ICD-10-CM | POA: Diagnosis not present

## 2019-08-14 DIAGNOSIS — F0390 Unspecified dementia without behavioral disturbance: Secondary | ICD-10-CM | POA: Diagnosis not present

## 2019-08-14 DIAGNOSIS — I130 Hypertensive heart and chronic kidney disease with heart failure and stage 1 through stage 4 chronic kidney disease, or unspecified chronic kidney disease: Secondary | ICD-10-CM | POA: Diagnosis not present

## 2019-08-14 DIAGNOSIS — I421 Obstructive hypertrophic cardiomyopathy: Secondary | ICD-10-CM | POA: Diagnosis not present

## 2019-08-14 DIAGNOSIS — I509 Heart failure, unspecified: Secondary | ICD-10-CM | POA: Diagnosis not present

## 2019-08-14 DIAGNOSIS — N183 Chronic kidney disease, stage 3 unspecified: Secondary | ICD-10-CM | POA: Diagnosis not present

## 2019-08-15 DIAGNOSIS — D631 Anemia in chronic kidney disease: Secondary | ICD-10-CM | POA: Diagnosis not present

## 2019-08-15 DIAGNOSIS — I130 Hypertensive heart and chronic kidney disease with heart failure and stage 1 through stage 4 chronic kidney disease, or unspecified chronic kidney disease: Secondary | ICD-10-CM | POA: Diagnosis not present

## 2019-08-15 DIAGNOSIS — I509 Heart failure, unspecified: Secondary | ICD-10-CM | POA: Diagnosis not present

## 2019-08-15 DIAGNOSIS — I421 Obstructive hypertrophic cardiomyopathy: Secondary | ICD-10-CM | POA: Diagnosis not present

## 2019-08-15 DIAGNOSIS — N183 Chronic kidney disease, stage 3 unspecified: Secondary | ICD-10-CM | POA: Diagnosis not present

## 2019-08-15 DIAGNOSIS — F0390 Unspecified dementia without behavioral disturbance: Secondary | ICD-10-CM | POA: Diagnosis not present

## 2019-08-19 DIAGNOSIS — F0390 Unspecified dementia without behavioral disturbance: Secondary | ICD-10-CM | POA: Diagnosis not present

## 2019-08-19 DIAGNOSIS — I130 Hypertensive heart and chronic kidney disease with heart failure and stage 1 through stage 4 chronic kidney disease, or unspecified chronic kidney disease: Secondary | ICD-10-CM | POA: Diagnosis not present

## 2019-08-19 DIAGNOSIS — I421 Obstructive hypertrophic cardiomyopathy: Secondary | ICD-10-CM | POA: Diagnosis not present

## 2019-08-19 DIAGNOSIS — I509 Heart failure, unspecified: Secondary | ICD-10-CM | POA: Diagnosis not present

## 2019-08-19 DIAGNOSIS — Z20828 Contact with and (suspected) exposure to other viral communicable diseases: Secondary | ICD-10-CM | POA: Diagnosis not present

## 2019-08-19 DIAGNOSIS — N183 Chronic kidney disease, stage 3 unspecified: Secondary | ICD-10-CM | POA: Diagnosis not present

## 2019-08-19 DIAGNOSIS — D631 Anemia in chronic kidney disease: Secondary | ICD-10-CM | POA: Diagnosis not present

## 2019-08-20 DIAGNOSIS — F0390 Unspecified dementia without behavioral disturbance: Secondary | ICD-10-CM | POA: Diagnosis not present

## 2019-08-20 DIAGNOSIS — D631 Anemia in chronic kidney disease: Secondary | ICD-10-CM | POA: Diagnosis not present

## 2019-08-20 DIAGNOSIS — I130 Hypertensive heart and chronic kidney disease with heart failure and stage 1 through stage 4 chronic kidney disease, or unspecified chronic kidney disease: Secondary | ICD-10-CM | POA: Diagnosis not present

## 2019-08-20 DIAGNOSIS — I509 Heart failure, unspecified: Secondary | ICD-10-CM | POA: Diagnosis not present

## 2019-08-20 DIAGNOSIS — N183 Chronic kidney disease, stage 3 unspecified: Secondary | ICD-10-CM | POA: Diagnosis not present

## 2019-08-20 DIAGNOSIS — I421 Obstructive hypertrophic cardiomyopathy: Secondary | ICD-10-CM | POA: Diagnosis not present

## 2019-08-21 DIAGNOSIS — I130 Hypertensive heart and chronic kidney disease with heart failure and stage 1 through stage 4 chronic kidney disease, or unspecified chronic kidney disease: Secondary | ICD-10-CM | POA: Diagnosis not present

## 2019-08-21 DIAGNOSIS — F0390 Unspecified dementia without behavioral disturbance: Secondary | ICD-10-CM | POA: Diagnosis not present

## 2019-08-21 DIAGNOSIS — I509 Heart failure, unspecified: Secondary | ICD-10-CM | POA: Diagnosis not present

## 2019-08-21 DIAGNOSIS — N183 Chronic kidney disease, stage 3 unspecified: Secondary | ICD-10-CM | POA: Diagnosis not present

## 2019-08-21 DIAGNOSIS — D631 Anemia in chronic kidney disease: Secondary | ICD-10-CM | POA: Diagnosis not present

## 2019-08-21 DIAGNOSIS — I421 Obstructive hypertrophic cardiomyopathy: Secondary | ICD-10-CM | POA: Diagnosis not present

## 2019-08-25 DIAGNOSIS — Z20828 Contact with and (suspected) exposure to other viral communicable diseases: Secondary | ICD-10-CM | POA: Diagnosis not present

## 2019-08-26 DIAGNOSIS — I130 Hypertensive heart and chronic kidney disease with heart failure and stage 1 through stage 4 chronic kidney disease, or unspecified chronic kidney disease: Secondary | ICD-10-CM | POA: Diagnosis not present

## 2019-08-26 DIAGNOSIS — N183 Chronic kidney disease, stage 3 unspecified: Secondary | ICD-10-CM | POA: Diagnosis not present

## 2019-08-26 DIAGNOSIS — I509 Heart failure, unspecified: Secondary | ICD-10-CM | POA: Diagnosis not present

## 2019-08-26 DIAGNOSIS — I421 Obstructive hypertrophic cardiomyopathy: Secondary | ICD-10-CM | POA: Diagnosis not present

## 2019-08-26 DIAGNOSIS — F0390 Unspecified dementia without behavioral disturbance: Secondary | ICD-10-CM | POA: Diagnosis not present

## 2019-08-26 DIAGNOSIS — D631 Anemia in chronic kidney disease: Secondary | ICD-10-CM | POA: Diagnosis not present

## 2019-08-27 DIAGNOSIS — I130 Hypertensive heart and chronic kidney disease with heart failure and stage 1 through stage 4 chronic kidney disease, or unspecified chronic kidney disease: Secondary | ICD-10-CM | POA: Diagnosis not present

## 2019-08-27 DIAGNOSIS — I421 Obstructive hypertrophic cardiomyopathy: Secondary | ICD-10-CM | POA: Diagnosis not present

## 2019-08-27 DIAGNOSIS — F0390 Unspecified dementia without behavioral disturbance: Secondary | ICD-10-CM | POA: Diagnosis not present

## 2019-08-27 DIAGNOSIS — I509 Heart failure, unspecified: Secondary | ICD-10-CM | POA: Diagnosis not present

## 2019-08-27 DIAGNOSIS — N183 Chronic kidney disease, stage 3 unspecified: Secondary | ICD-10-CM | POA: Diagnosis not present

## 2019-08-27 DIAGNOSIS — D631 Anemia in chronic kidney disease: Secondary | ICD-10-CM | POA: Diagnosis not present

## 2019-08-28 DIAGNOSIS — D631 Anemia in chronic kidney disease: Secondary | ICD-10-CM | POA: Diagnosis not present

## 2019-08-28 DIAGNOSIS — I421 Obstructive hypertrophic cardiomyopathy: Secondary | ICD-10-CM | POA: Diagnosis not present

## 2019-08-28 DIAGNOSIS — F0390 Unspecified dementia without behavioral disturbance: Secondary | ICD-10-CM | POA: Diagnosis not present

## 2019-08-28 DIAGNOSIS — I130 Hypertensive heart and chronic kidney disease with heart failure and stage 1 through stage 4 chronic kidney disease, or unspecified chronic kidney disease: Secondary | ICD-10-CM | POA: Diagnosis not present

## 2019-08-28 DIAGNOSIS — I509 Heart failure, unspecified: Secondary | ICD-10-CM | POA: Diagnosis not present

## 2019-08-28 DIAGNOSIS — N183 Chronic kidney disease, stage 3 unspecified: Secondary | ICD-10-CM | POA: Diagnosis not present

## 2019-09-01 DIAGNOSIS — Z20828 Contact with and (suspected) exposure to other viral communicable diseases: Secondary | ICD-10-CM | POA: Diagnosis not present

## 2019-09-02 DIAGNOSIS — I509 Heart failure, unspecified: Secondary | ICD-10-CM | POA: Diagnosis not present

## 2019-09-02 DIAGNOSIS — I421 Obstructive hypertrophic cardiomyopathy: Secondary | ICD-10-CM | POA: Diagnosis not present

## 2019-09-02 DIAGNOSIS — D631 Anemia in chronic kidney disease: Secondary | ICD-10-CM | POA: Diagnosis not present

## 2019-09-02 DIAGNOSIS — I130 Hypertensive heart and chronic kidney disease with heart failure and stage 1 through stage 4 chronic kidney disease, or unspecified chronic kidney disease: Secondary | ICD-10-CM | POA: Diagnosis not present

## 2019-09-02 DIAGNOSIS — N183 Chronic kidney disease, stage 3 unspecified: Secondary | ICD-10-CM | POA: Diagnosis not present

## 2019-09-02 DIAGNOSIS — F0390 Unspecified dementia without behavioral disturbance: Secondary | ICD-10-CM | POA: Diagnosis not present

## 2019-09-04 DIAGNOSIS — D631 Anemia in chronic kidney disease: Secondary | ICD-10-CM | POA: Diagnosis not present

## 2019-09-04 DIAGNOSIS — F0390 Unspecified dementia without behavioral disturbance: Secondary | ICD-10-CM | POA: Diagnosis not present

## 2019-09-04 DIAGNOSIS — I421 Obstructive hypertrophic cardiomyopathy: Secondary | ICD-10-CM | POA: Diagnosis not present

## 2019-09-04 DIAGNOSIS — I130 Hypertensive heart and chronic kidney disease with heart failure and stage 1 through stage 4 chronic kidney disease, or unspecified chronic kidney disease: Secondary | ICD-10-CM | POA: Diagnosis not present

## 2019-09-04 DIAGNOSIS — N183 Chronic kidney disease, stage 3 unspecified: Secondary | ICD-10-CM | POA: Diagnosis not present

## 2019-09-04 DIAGNOSIS — I509 Heart failure, unspecified: Secondary | ICD-10-CM | POA: Diagnosis not present

## 2019-09-05 DIAGNOSIS — Z993 Dependence on wheelchair: Secondary | ICD-10-CM | POA: Diagnosis not present

## 2019-09-05 DIAGNOSIS — Z87440 Personal history of urinary (tract) infections: Secondary | ICD-10-CM | POA: Diagnosis not present

## 2019-09-05 DIAGNOSIS — D631 Anemia in chronic kidney disease: Secondary | ICD-10-CM | POA: Diagnosis not present

## 2019-09-05 DIAGNOSIS — R32 Unspecified urinary incontinence: Secondary | ICD-10-CM | POA: Diagnosis not present

## 2019-09-05 DIAGNOSIS — F0390 Unspecified dementia without behavioral disturbance: Secondary | ICD-10-CM | POA: Diagnosis not present

## 2019-09-05 DIAGNOSIS — I421 Obstructive hypertrophic cardiomyopathy: Secondary | ICD-10-CM | POA: Diagnosis not present

## 2019-09-05 DIAGNOSIS — I130 Hypertensive heart and chronic kidney disease with heart failure and stage 1 through stage 4 chronic kidney disease, or unspecified chronic kidney disease: Secondary | ICD-10-CM | POA: Diagnosis not present

## 2019-09-05 DIAGNOSIS — N183 Chronic kidney disease, stage 3 unspecified: Secondary | ICD-10-CM | POA: Diagnosis not present

## 2019-09-05 DIAGNOSIS — I509 Heart failure, unspecified: Secondary | ICD-10-CM | POA: Diagnosis not present

## 2019-09-05 DIAGNOSIS — N952 Postmenopausal atrophic vaginitis: Secondary | ICD-10-CM | POA: Diagnosis not present

## 2019-09-05 DIAGNOSIS — R159 Full incontinence of feces: Secondary | ICD-10-CM | POA: Diagnosis not present

## 2019-09-05 DIAGNOSIS — Z6821 Body mass index (BMI) 21.0-21.9, adult: Secondary | ICD-10-CM | POA: Diagnosis not present

## 2019-09-05 DIAGNOSIS — R627 Adult failure to thrive: Secondary | ICD-10-CM | POA: Diagnosis not present

## 2019-09-05 DIAGNOSIS — I700 Atherosclerosis of aorta: Secondary | ICD-10-CM | POA: Diagnosis not present

## 2019-09-08 DIAGNOSIS — N183 Chronic kidney disease, stage 3 unspecified: Secondary | ICD-10-CM | POA: Diagnosis not present

## 2019-09-08 DIAGNOSIS — I509 Heart failure, unspecified: Secondary | ICD-10-CM | POA: Diagnosis not present

## 2019-09-08 DIAGNOSIS — I130 Hypertensive heart and chronic kidney disease with heart failure and stage 1 through stage 4 chronic kidney disease, or unspecified chronic kidney disease: Secondary | ICD-10-CM | POA: Diagnosis not present

## 2019-09-08 DIAGNOSIS — F0390 Unspecified dementia without behavioral disturbance: Secondary | ICD-10-CM | POA: Diagnosis not present

## 2019-09-08 DIAGNOSIS — D631 Anemia in chronic kidney disease: Secondary | ICD-10-CM | POA: Diagnosis not present

## 2019-09-08 DIAGNOSIS — I421 Obstructive hypertrophic cardiomyopathy: Secondary | ICD-10-CM | POA: Diagnosis not present

## 2019-09-09 DIAGNOSIS — Z20828 Contact with and (suspected) exposure to other viral communicable diseases: Secondary | ICD-10-CM | POA: Diagnosis not present

## 2019-09-09 DIAGNOSIS — D631 Anemia in chronic kidney disease: Secondary | ICD-10-CM | POA: Diagnosis not present

## 2019-09-09 DIAGNOSIS — I130 Hypertensive heart and chronic kidney disease with heart failure and stage 1 through stage 4 chronic kidney disease, or unspecified chronic kidney disease: Secondary | ICD-10-CM | POA: Diagnosis not present

## 2019-09-09 DIAGNOSIS — F0390 Unspecified dementia without behavioral disturbance: Secondary | ICD-10-CM | POA: Diagnosis not present

## 2019-09-09 DIAGNOSIS — N183 Chronic kidney disease, stage 3 unspecified: Secondary | ICD-10-CM | POA: Diagnosis not present

## 2019-09-09 DIAGNOSIS — I421 Obstructive hypertrophic cardiomyopathy: Secondary | ICD-10-CM | POA: Diagnosis not present

## 2019-09-09 DIAGNOSIS — I509 Heart failure, unspecified: Secondary | ICD-10-CM | POA: Diagnosis not present

## 2019-09-11 DIAGNOSIS — N183 Chronic kidney disease, stage 3 unspecified: Secondary | ICD-10-CM | POA: Diagnosis not present

## 2019-09-11 DIAGNOSIS — D631 Anemia in chronic kidney disease: Secondary | ICD-10-CM | POA: Diagnosis not present

## 2019-09-11 DIAGNOSIS — F0390 Unspecified dementia without behavioral disturbance: Secondary | ICD-10-CM | POA: Diagnosis not present

## 2019-09-11 DIAGNOSIS — I130 Hypertensive heart and chronic kidney disease with heart failure and stage 1 through stage 4 chronic kidney disease, or unspecified chronic kidney disease: Secondary | ICD-10-CM | POA: Diagnosis not present

## 2019-09-11 DIAGNOSIS — I509 Heart failure, unspecified: Secondary | ICD-10-CM | POA: Diagnosis not present

## 2019-09-11 DIAGNOSIS — I421 Obstructive hypertrophic cardiomyopathy: Secondary | ICD-10-CM | POA: Diagnosis not present

## 2019-09-15 DIAGNOSIS — I421 Obstructive hypertrophic cardiomyopathy: Secondary | ICD-10-CM | POA: Diagnosis not present

## 2019-09-15 DIAGNOSIS — F0390 Unspecified dementia without behavioral disturbance: Secondary | ICD-10-CM | POA: Diagnosis not present

## 2019-09-15 DIAGNOSIS — D631 Anemia in chronic kidney disease: Secondary | ICD-10-CM | POA: Diagnosis not present

## 2019-09-15 DIAGNOSIS — I509 Heart failure, unspecified: Secondary | ICD-10-CM | POA: Diagnosis not present

## 2019-09-15 DIAGNOSIS — I130 Hypertensive heart and chronic kidney disease with heart failure and stage 1 through stage 4 chronic kidney disease, or unspecified chronic kidney disease: Secondary | ICD-10-CM | POA: Diagnosis not present

## 2019-09-15 DIAGNOSIS — N183 Chronic kidney disease, stage 3 unspecified: Secondary | ICD-10-CM | POA: Diagnosis not present

## 2019-09-16 DIAGNOSIS — I739 Peripheral vascular disease, unspecified: Secondary | ICD-10-CM | POA: Diagnosis not present

## 2019-09-16 DIAGNOSIS — N183 Chronic kidney disease, stage 3 unspecified: Secondary | ICD-10-CM | POA: Diagnosis not present

## 2019-09-16 DIAGNOSIS — I509 Heart failure, unspecified: Secondary | ICD-10-CM | POA: Diagnosis not present

## 2019-09-16 DIAGNOSIS — D631 Anemia in chronic kidney disease: Secondary | ICD-10-CM | POA: Diagnosis not present

## 2019-09-16 DIAGNOSIS — Z20828 Contact with and (suspected) exposure to other viral communicable diseases: Secondary | ICD-10-CM | POA: Diagnosis not present

## 2019-09-16 DIAGNOSIS — B351 Tinea unguium: Secondary | ICD-10-CM | POA: Diagnosis not present

## 2019-09-16 DIAGNOSIS — Q845 Enlarged and hypertrophic nails: Secondary | ICD-10-CM | POA: Diagnosis not present

## 2019-09-16 DIAGNOSIS — I130 Hypertensive heart and chronic kidney disease with heart failure and stage 1 through stage 4 chronic kidney disease, or unspecified chronic kidney disease: Secondary | ICD-10-CM | POA: Diagnosis not present

## 2019-09-16 DIAGNOSIS — I421 Obstructive hypertrophic cardiomyopathy: Secondary | ICD-10-CM | POA: Diagnosis not present

## 2019-09-16 DIAGNOSIS — F0390 Unspecified dementia without behavioral disturbance: Secondary | ICD-10-CM | POA: Diagnosis not present

## 2019-09-18 DIAGNOSIS — N183 Chronic kidney disease, stage 3 unspecified: Secondary | ICD-10-CM | POA: Diagnosis not present

## 2019-09-18 DIAGNOSIS — B351 Tinea unguium: Secondary | ICD-10-CM | POA: Diagnosis not present

## 2019-09-18 DIAGNOSIS — I421 Obstructive hypertrophic cardiomyopathy: Secondary | ICD-10-CM | POA: Diagnosis not present

## 2019-09-18 DIAGNOSIS — I130 Hypertensive heart and chronic kidney disease with heart failure and stage 1 through stage 4 chronic kidney disease, or unspecified chronic kidney disease: Secondary | ICD-10-CM | POA: Diagnosis not present

## 2019-09-18 DIAGNOSIS — L608 Other nail disorders: Secondary | ICD-10-CM | POA: Diagnosis not present

## 2019-09-18 DIAGNOSIS — F0390 Unspecified dementia without behavioral disturbance: Secondary | ICD-10-CM | POA: Diagnosis not present

## 2019-09-18 DIAGNOSIS — D631 Anemia in chronic kidney disease: Secondary | ICD-10-CM | POA: Diagnosis not present

## 2019-09-18 DIAGNOSIS — I509 Heart failure, unspecified: Secondary | ICD-10-CM | POA: Diagnosis not present

## 2019-09-20 DIAGNOSIS — Z23 Encounter for immunization: Secondary | ICD-10-CM | POA: Diagnosis not present

## 2019-09-23 DIAGNOSIS — I130 Hypertensive heart and chronic kidney disease with heart failure and stage 1 through stage 4 chronic kidney disease, or unspecified chronic kidney disease: Secondary | ICD-10-CM | POA: Diagnosis not present

## 2019-09-23 DIAGNOSIS — D631 Anemia in chronic kidney disease: Secondary | ICD-10-CM | POA: Diagnosis not present

## 2019-09-23 DIAGNOSIS — Z20828 Contact with and (suspected) exposure to other viral communicable diseases: Secondary | ICD-10-CM | POA: Diagnosis not present

## 2019-09-23 DIAGNOSIS — F0390 Unspecified dementia without behavioral disturbance: Secondary | ICD-10-CM | POA: Diagnosis not present

## 2019-09-23 DIAGNOSIS — I421 Obstructive hypertrophic cardiomyopathy: Secondary | ICD-10-CM | POA: Diagnosis not present

## 2019-09-23 DIAGNOSIS — I509 Heart failure, unspecified: Secondary | ICD-10-CM | POA: Diagnosis not present

## 2019-09-23 DIAGNOSIS — N183 Chronic kidney disease, stage 3 unspecified: Secondary | ICD-10-CM | POA: Diagnosis not present

## 2019-09-24 DIAGNOSIS — I509 Heart failure, unspecified: Secondary | ICD-10-CM | POA: Diagnosis not present

## 2019-09-24 DIAGNOSIS — D631 Anemia in chronic kidney disease: Secondary | ICD-10-CM | POA: Diagnosis not present

## 2019-09-24 DIAGNOSIS — F0390 Unspecified dementia without behavioral disturbance: Secondary | ICD-10-CM | POA: Diagnosis not present

## 2019-09-24 DIAGNOSIS — I130 Hypertensive heart and chronic kidney disease with heart failure and stage 1 through stage 4 chronic kidney disease, or unspecified chronic kidney disease: Secondary | ICD-10-CM | POA: Diagnosis not present

## 2019-09-24 DIAGNOSIS — I421 Obstructive hypertrophic cardiomyopathy: Secondary | ICD-10-CM | POA: Diagnosis not present

## 2019-09-24 DIAGNOSIS — N183 Chronic kidney disease, stage 3 unspecified: Secondary | ICD-10-CM | POA: Diagnosis not present

## 2019-09-25 DIAGNOSIS — N183 Chronic kidney disease, stage 3 unspecified: Secondary | ICD-10-CM | POA: Diagnosis not present

## 2019-09-25 DIAGNOSIS — I421 Obstructive hypertrophic cardiomyopathy: Secondary | ICD-10-CM | POA: Diagnosis not present

## 2019-09-25 DIAGNOSIS — D631 Anemia in chronic kidney disease: Secondary | ICD-10-CM | POA: Diagnosis not present

## 2019-09-25 DIAGNOSIS — I509 Heart failure, unspecified: Secondary | ICD-10-CM | POA: Diagnosis not present

## 2019-09-25 DIAGNOSIS — F0390 Unspecified dementia without behavioral disturbance: Secondary | ICD-10-CM | POA: Diagnosis not present

## 2019-09-25 DIAGNOSIS — I130 Hypertensive heart and chronic kidney disease with heart failure and stage 1 through stage 4 chronic kidney disease, or unspecified chronic kidney disease: Secondary | ICD-10-CM | POA: Diagnosis not present

## 2019-09-29 DIAGNOSIS — I421 Obstructive hypertrophic cardiomyopathy: Secondary | ICD-10-CM | POA: Diagnosis not present

## 2019-09-29 DIAGNOSIS — N183 Chronic kidney disease, stage 3 unspecified: Secondary | ICD-10-CM | POA: Diagnosis not present

## 2019-09-29 DIAGNOSIS — I509 Heart failure, unspecified: Secondary | ICD-10-CM | POA: Diagnosis not present

## 2019-09-29 DIAGNOSIS — F0390 Unspecified dementia without behavioral disturbance: Secondary | ICD-10-CM | POA: Diagnosis not present

## 2019-09-29 DIAGNOSIS — D631 Anemia in chronic kidney disease: Secondary | ICD-10-CM | POA: Diagnosis not present

## 2019-09-29 DIAGNOSIS — I130 Hypertensive heart and chronic kidney disease with heart failure and stage 1 through stage 4 chronic kidney disease, or unspecified chronic kidney disease: Secondary | ICD-10-CM | POA: Diagnosis not present

## 2019-09-30 DIAGNOSIS — Z20828 Contact with and (suspected) exposure to other viral communicable diseases: Secondary | ICD-10-CM | POA: Diagnosis not present

## 2019-10-02 DIAGNOSIS — I509 Heart failure, unspecified: Secondary | ICD-10-CM | POA: Diagnosis not present

## 2019-10-02 DIAGNOSIS — D631 Anemia in chronic kidney disease: Secondary | ICD-10-CM | POA: Diagnosis not present

## 2019-10-02 DIAGNOSIS — I130 Hypertensive heart and chronic kidney disease with heart failure and stage 1 through stage 4 chronic kidney disease, or unspecified chronic kidney disease: Secondary | ICD-10-CM | POA: Diagnosis not present

## 2019-10-02 DIAGNOSIS — F0390 Unspecified dementia without behavioral disturbance: Secondary | ICD-10-CM | POA: Diagnosis not present

## 2019-10-02 DIAGNOSIS — N183 Chronic kidney disease, stage 3 unspecified: Secondary | ICD-10-CM | POA: Diagnosis not present

## 2019-10-02 DIAGNOSIS — I421 Obstructive hypertrophic cardiomyopathy: Secondary | ICD-10-CM | POA: Diagnosis not present

## 2019-10-06 DIAGNOSIS — S91204D Unspecified open wound of right lesser toe(s) with damage to nail, subsequent encounter: Secondary | ICD-10-CM | POA: Diagnosis not present

## 2019-10-06 DIAGNOSIS — I130 Hypertensive heart and chronic kidney disease with heart failure and stage 1 through stage 4 chronic kidney disease, or unspecified chronic kidney disease: Secondary | ICD-10-CM | POA: Diagnosis not present

## 2019-10-06 DIAGNOSIS — I509 Heart failure, unspecified: Secondary | ICD-10-CM | POA: Diagnosis not present

## 2019-10-06 DIAGNOSIS — R627 Adult failure to thrive: Secondary | ICD-10-CM | POA: Diagnosis not present

## 2019-10-06 DIAGNOSIS — Z6821 Body mass index (BMI) 21.0-21.9, adult: Secondary | ICD-10-CM | POA: Diagnosis not present

## 2019-10-06 DIAGNOSIS — Z993 Dependence on wheelchair: Secondary | ICD-10-CM | POA: Diagnosis not present

## 2019-10-06 DIAGNOSIS — Z87440 Personal history of urinary (tract) infections: Secondary | ICD-10-CM | POA: Diagnosis not present

## 2019-10-06 DIAGNOSIS — I700 Atherosclerosis of aorta: Secondary | ICD-10-CM | POA: Diagnosis not present

## 2019-10-06 DIAGNOSIS — S81811D Laceration without foreign body, right lower leg, subsequent encounter: Secondary | ICD-10-CM | POA: Diagnosis not present

## 2019-10-06 DIAGNOSIS — N183 Chronic kidney disease, stage 3 unspecified: Secondary | ICD-10-CM | POA: Diagnosis not present

## 2019-10-06 DIAGNOSIS — R32 Unspecified urinary incontinence: Secondary | ICD-10-CM | POA: Diagnosis not present

## 2019-10-06 DIAGNOSIS — N952 Postmenopausal atrophic vaginitis: Secondary | ICD-10-CM | POA: Diagnosis not present

## 2019-10-06 DIAGNOSIS — D631 Anemia in chronic kidney disease: Secondary | ICD-10-CM | POA: Diagnosis not present

## 2019-10-06 DIAGNOSIS — R159 Full incontinence of feces: Secondary | ICD-10-CM | POA: Diagnosis not present

## 2019-10-06 DIAGNOSIS — F0390 Unspecified dementia without behavioral disturbance: Secondary | ICD-10-CM | POA: Diagnosis not present

## 2019-10-06 DIAGNOSIS — I421 Obstructive hypertrophic cardiomyopathy: Secondary | ICD-10-CM | POA: Diagnosis not present

## 2019-10-07 DIAGNOSIS — I130 Hypertensive heart and chronic kidney disease with heart failure and stage 1 through stage 4 chronic kidney disease, or unspecified chronic kidney disease: Secondary | ICD-10-CM | POA: Diagnosis not present

## 2019-10-07 DIAGNOSIS — F0390 Unspecified dementia without behavioral disturbance: Secondary | ICD-10-CM | POA: Diagnosis not present

## 2019-10-07 DIAGNOSIS — Z20828 Contact with and (suspected) exposure to other viral communicable diseases: Secondary | ICD-10-CM | POA: Diagnosis not present

## 2019-10-07 DIAGNOSIS — D631 Anemia in chronic kidney disease: Secondary | ICD-10-CM | POA: Diagnosis not present

## 2019-10-07 DIAGNOSIS — N183 Chronic kidney disease, stage 3 unspecified: Secondary | ICD-10-CM | POA: Diagnosis not present

## 2019-10-07 DIAGNOSIS — I421 Obstructive hypertrophic cardiomyopathy: Secondary | ICD-10-CM | POA: Diagnosis not present

## 2019-10-07 DIAGNOSIS — I509 Heart failure, unspecified: Secondary | ICD-10-CM | POA: Diagnosis not present

## 2019-10-08 DIAGNOSIS — I421 Obstructive hypertrophic cardiomyopathy: Secondary | ICD-10-CM | POA: Diagnosis not present

## 2019-10-08 DIAGNOSIS — D631 Anemia in chronic kidney disease: Secondary | ICD-10-CM | POA: Diagnosis not present

## 2019-10-08 DIAGNOSIS — I130 Hypertensive heart and chronic kidney disease with heart failure and stage 1 through stage 4 chronic kidney disease, or unspecified chronic kidney disease: Secondary | ICD-10-CM | POA: Diagnosis not present

## 2019-10-08 DIAGNOSIS — F0390 Unspecified dementia without behavioral disturbance: Secondary | ICD-10-CM | POA: Diagnosis not present

## 2019-10-08 DIAGNOSIS — N183 Chronic kidney disease, stage 3 unspecified: Secondary | ICD-10-CM | POA: Diagnosis not present

## 2019-10-08 DIAGNOSIS — I509 Heart failure, unspecified: Secondary | ICD-10-CM | POA: Diagnosis not present

## 2019-10-09 DIAGNOSIS — I130 Hypertensive heart and chronic kidney disease with heart failure and stage 1 through stage 4 chronic kidney disease, or unspecified chronic kidney disease: Secondary | ICD-10-CM | POA: Diagnosis not present

## 2019-10-09 DIAGNOSIS — D631 Anemia in chronic kidney disease: Secondary | ICD-10-CM | POA: Diagnosis not present

## 2019-10-09 DIAGNOSIS — F0390 Unspecified dementia without behavioral disturbance: Secondary | ICD-10-CM | POA: Diagnosis not present

## 2019-10-09 DIAGNOSIS — I509 Heart failure, unspecified: Secondary | ICD-10-CM | POA: Diagnosis not present

## 2019-10-09 DIAGNOSIS — N183 Chronic kidney disease, stage 3 unspecified: Secondary | ICD-10-CM | POA: Diagnosis not present

## 2019-10-09 DIAGNOSIS — I421 Obstructive hypertrophic cardiomyopathy: Secondary | ICD-10-CM | POA: Diagnosis not present

## 2019-10-10 DIAGNOSIS — N183 Chronic kidney disease, stage 3 unspecified: Secondary | ICD-10-CM | POA: Diagnosis not present

## 2019-10-10 DIAGNOSIS — I421 Obstructive hypertrophic cardiomyopathy: Secondary | ICD-10-CM | POA: Diagnosis not present

## 2019-10-10 DIAGNOSIS — I130 Hypertensive heart and chronic kidney disease with heart failure and stage 1 through stage 4 chronic kidney disease, or unspecified chronic kidney disease: Secondary | ICD-10-CM | POA: Diagnosis not present

## 2019-10-10 DIAGNOSIS — D631 Anemia in chronic kidney disease: Secondary | ICD-10-CM | POA: Diagnosis not present

## 2019-10-10 DIAGNOSIS — I509 Heart failure, unspecified: Secondary | ICD-10-CM | POA: Diagnosis not present

## 2019-10-10 DIAGNOSIS — F0390 Unspecified dementia without behavioral disturbance: Secondary | ICD-10-CM | POA: Diagnosis not present

## 2019-10-13 DIAGNOSIS — I509 Heart failure, unspecified: Secondary | ICD-10-CM | POA: Diagnosis not present

## 2019-10-13 DIAGNOSIS — F0390 Unspecified dementia without behavioral disturbance: Secondary | ICD-10-CM | POA: Diagnosis not present

## 2019-10-13 DIAGNOSIS — I130 Hypertensive heart and chronic kidney disease with heart failure and stage 1 through stage 4 chronic kidney disease, or unspecified chronic kidney disease: Secondary | ICD-10-CM | POA: Diagnosis not present

## 2019-10-13 DIAGNOSIS — N183 Chronic kidney disease, stage 3 unspecified: Secondary | ICD-10-CM | POA: Diagnosis not present

## 2019-10-13 DIAGNOSIS — D631 Anemia in chronic kidney disease: Secondary | ICD-10-CM | POA: Diagnosis not present

## 2019-10-13 DIAGNOSIS — I421 Obstructive hypertrophic cardiomyopathy: Secondary | ICD-10-CM | POA: Diagnosis not present

## 2019-10-18 DIAGNOSIS — Z23 Encounter for immunization: Secondary | ICD-10-CM | POA: Diagnosis not present

## 2019-10-21 DIAGNOSIS — F0390 Unspecified dementia without behavioral disturbance: Secondary | ICD-10-CM | POA: Diagnosis not present

## 2019-10-21 DIAGNOSIS — N183 Chronic kidney disease, stage 3 unspecified: Secondary | ICD-10-CM | POA: Diagnosis not present

## 2019-10-21 DIAGNOSIS — D631 Anemia in chronic kidney disease: Secondary | ICD-10-CM | POA: Diagnosis not present

## 2019-10-21 DIAGNOSIS — I421 Obstructive hypertrophic cardiomyopathy: Secondary | ICD-10-CM | POA: Diagnosis not present

## 2019-10-21 DIAGNOSIS — I130 Hypertensive heart and chronic kidney disease with heart failure and stage 1 through stage 4 chronic kidney disease, or unspecified chronic kidney disease: Secondary | ICD-10-CM | POA: Diagnosis not present

## 2019-10-21 DIAGNOSIS — I509 Heart failure, unspecified: Secondary | ICD-10-CM | POA: Diagnosis not present

## 2019-10-27 DIAGNOSIS — D631 Anemia in chronic kidney disease: Secondary | ICD-10-CM | POA: Diagnosis not present

## 2019-10-27 DIAGNOSIS — N183 Chronic kidney disease, stage 3 unspecified: Secondary | ICD-10-CM | POA: Diagnosis not present

## 2019-10-27 DIAGNOSIS — F0390 Unspecified dementia without behavioral disturbance: Secondary | ICD-10-CM | POA: Diagnosis not present

## 2019-10-27 DIAGNOSIS — I130 Hypertensive heart and chronic kidney disease with heart failure and stage 1 through stage 4 chronic kidney disease, or unspecified chronic kidney disease: Secondary | ICD-10-CM | POA: Diagnosis not present

## 2019-10-27 DIAGNOSIS — I509 Heart failure, unspecified: Secondary | ICD-10-CM | POA: Diagnosis not present

## 2019-10-27 DIAGNOSIS — I421 Obstructive hypertrophic cardiomyopathy: Secondary | ICD-10-CM | POA: Diagnosis not present

## 2019-10-28 DIAGNOSIS — I421 Obstructive hypertrophic cardiomyopathy: Secondary | ICD-10-CM | POA: Diagnosis not present

## 2019-10-28 DIAGNOSIS — I509 Heart failure, unspecified: Secondary | ICD-10-CM | POA: Diagnosis not present

## 2019-10-28 DIAGNOSIS — N183 Chronic kidney disease, stage 3 unspecified: Secondary | ICD-10-CM | POA: Diagnosis not present

## 2019-10-28 DIAGNOSIS — D631 Anemia in chronic kidney disease: Secondary | ICD-10-CM | POA: Diagnosis not present

## 2019-10-28 DIAGNOSIS — I130 Hypertensive heart and chronic kidney disease with heart failure and stage 1 through stage 4 chronic kidney disease, or unspecified chronic kidney disease: Secondary | ICD-10-CM | POA: Diagnosis not present

## 2019-10-28 DIAGNOSIS — F0390 Unspecified dementia without behavioral disturbance: Secondary | ICD-10-CM | POA: Diagnosis not present

## 2019-10-30 DIAGNOSIS — I421 Obstructive hypertrophic cardiomyopathy: Secondary | ICD-10-CM | POA: Diagnosis not present

## 2019-10-30 DIAGNOSIS — I509 Heart failure, unspecified: Secondary | ICD-10-CM | POA: Diagnosis not present

## 2019-10-30 DIAGNOSIS — I130 Hypertensive heart and chronic kidney disease with heart failure and stage 1 through stage 4 chronic kidney disease, or unspecified chronic kidney disease: Secondary | ICD-10-CM | POA: Diagnosis not present

## 2019-10-30 DIAGNOSIS — D631 Anemia in chronic kidney disease: Secondary | ICD-10-CM | POA: Diagnosis not present

## 2019-10-30 DIAGNOSIS — N183 Chronic kidney disease, stage 3 unspecified: Secondary | ICD-10-CM | POA: Diagnosis not present

## 2019-10-30 DIAGNOSIS — F0390 Unspecified dementia without behavioral disturbance: Secondary | ICD-10-CM | POA: Diagnosis not present

## 2019-11-03 DIAGNOSIS — R627 Adult failure to thrive: Secondary | ICD-10-CM | POA: Diagnosis not present

## 2019-11-03 DIAGNOSIS — Z87440 Personal history of urinary (tract) infections: Secondary | ICD-10-CM | POA: Diagnosis not present

## 2019-11-03 DIAGNOSIS — R159 Full incontinence of feces: Secondary | ICD-10-CM | POA: Diagnosis not present

## 2019-11-03 DIAGNOSIS — N183 Chronic kidney disease, stage 3 unspecified: Secondary | ICD-10-CM | POA: Diagnosis not present

## 2019-11-03 DIAGNOSIS — R32 Unspecified urinary incontinence: Secondary | ICD-10-CM | POA: Diagnosis not present

## 2019-11-03 DIAGNOSIS — I700 Atherosclerosis of aorta: Secondary | ICD-10-CM | POA: Diagnosis not present

## 2019-11-03 DIAGNOSIS — Z993 Dependence on wheelchair: Secondary | ICD-10-CM | POA: Diagnosis not present

## 2019-11-03 DIAGNOSIS — I421 Obstructive hypertrophic cardiomyopathy: Secondary | ICD-10-CM | POA: Diagnosis not present

## 2019-11-03 DIAGNOSIS — N952 Postmenopausal atrophic vaginitis: Secondary | ICD-10-CM | POA: Diagnosis not present

## 2019-11-03 DIAGNOSIS — D631 Anemia in chronic kidney disease: Secondary | ICD-10-CM | POA: Diagnosis not present

## 2019-11-03 DIAGNOSIS — F0390 Unspecified dementia without behavioral disturbance: Secondary | ICD-10-CM | POA: Diagnosis not present

## 2019-11-03 DIAGNOSIS — Z6821 Body mass index (BMI) 21.0-21.9, adult: Secondary | ICD-10-CM | POA: Diagnosis not present

## 2019-11-03 DIAGNOSIS — I130 Hypertensive heart and chronic kidney disease with heart failure and stage 1 through stage 4 chronic kidney disease, or unspecified chronic kidney disease: Secondary | ICD-10-CM | POA: Diagnosis not present

## 2019-11-03 DIAGNOSIS — S81811D Laceration without foreign body, right lower leg, subsequent encounter: Secondary | ICD-10-CM | POA: Diagnosis not present

## 2019-11-03 DIAGNOSIS — I509 Heart failure, unspecified: Secondary | ICD-10-CM | POA: Diagnosis not present

## 2019-11-03 DIAGNOSIS — S91204D Unspecified open wound of right lesser toe(s) with damage to nail, subsequent encounter: Secondary | ICD-10-CM | POA: Diagnosis not present

## 2019-11-04 DIAGNOSIS — D631 Anemia in chronic kidney disease: Secondary | ICD-10-CM | POA: Diagnosis not present

## 2019-11-04 DIAGNOSIS — F0390 Unspecified dementia without behavioral disturbance: Secondary | ICD-10-CM | POA: Diagnosis not present

## 2019-11-04 DIAGNOSIS — I509 Heart failure, unspecified: Secondary | ICD-10-CM | POA: Diagnosis not present

## 2019-11-04 DIAGNOSIS — N183 Chronic kidney disease, stage 3 unspecified: Secondary | ICD-10-CM | POA: Diagnosis not present

## 2019-11-04 DIAGNOSIS — I421 Obstructive hypertrophic cardiomyopathy: Secondary | ICD-10-CM | POA: Diagnosis not present

## 2019-11-04 DIAGNOSIS — I130 Hypertensive heart and chronic kidney disease with heart failure and stage 1 through stage 4 chronic kidney disease, or unspecified chronic kidney disease: Secondary | ICD-10-CM | POA: Diagnosis not present

## 2019-11-06 DIAGNOSIS — I421 Obstructive hypertrophic cardiomyopathy: Secondary | ICD-10-CM | POA: Diagnosis not present

## 2019-11-06 DIAGNOSIS — I130 Hypertensive heart and chronic kidney disease with heart failure and stage 1 through stage 4 chronic kidney disease, or unspecified chronic kidney disease: Secondary | ICD-10-CM | POA: Diagnosis not present

## 2019-11-06 DIAGNOSIS — I509 Heart failure, unspecified: Secondary | ICD-10-CM | POA: Diagnosis not present

## 2019-11-06 DIAGNOSIS — D631 Anemia in chronic kidney disease: Secondary | ICD-10-CM | POA: Diagnosis not present

## 2019-11-06 DIAGNOSIS — N183 Chronic kidney disease, stage 3 unspecified: Secondary | ICD-10-CM | POA: Diagnosis not present

## 2019-11-06 DIAGNOSIS — F0390 Unspecified dementia without behavioral disturbance: Secondary | ICD-10-CM | POA: Diagnosis not present

## 2019-11-11 DIAGNOSIS — I421 Obstructive hypertrophic cardiomyopathy: Secondary | ICD-10-CM | POA: Diagnosis not present

## 2019-11-11 DIAGNOSIS — F0390 Unspecified dementia without behavioral disturbance: Secondary | ICD-10-CM | POA: Diagnosis not present

## 2019-11-11 DIAGNOSIS — I130 Hypertensive heart and chronic kidney disease with heart failure and stage 1 through stage 4 chronic kidney disease, or unspecified chronic kidney disease: Secondary | ICD-10-CM | POA: Diagnosis not present

## 2019-11-11 DIAGNOSIS — D631 Anemia in chronic kidney disease: Secondary | ICD-10-CM | POA: Diagnosis not present

## 2019-11-11 DIAGNOSIS — I509 Heart failure, unspecified: Secondary | ICD-10-CM | POA: Diagnosis not present

## 2019-11-11 DIAGNOSIS — N183 Chronic kidney disease, stage 3 unspecified: Secondary | ICD-10-CM | POA: Diagnosis not present

## 2019-11-12 DIAGNOSIS — N183 Chronic kidney disease, stage 3 unspecified: Secondary | ICD-10-CM | POA: Diagnosis not present

## 2019-11-12 DIAGNOSIS — I421 Obstructive hypertrophic cardiomyopathy: Secondary | ICD-10-CM | POA: Diagnosis not present

## 2019-11-12 DIAGNOSIS — F0390 Unspecified dementia without behavioral disturbance: Secondary | ICD-10-CM | POA: Diagnosis not present

## 2019-11-12 DIAGNOSIS — D631 Anemia in chronic kidney disease: Secondary | ICD-10-CM | POA: Diagnosis not present

## 2019-11-12 DIAGNOSIS — I509 Heart failure, unspecified: Secondary | ICD-10-CM | POA: Diagnosis not present

## 2019-11-12 DIAGNOSIS — I130 Hypertensive heart and chronic kidney disease with heart failure and stage 1 through stage 4 chronic kidney disease, or unspecified chronic kidney disease: Secondary | ICD-10-CM | POA: Diagnosis not present

## 2019-11-13 DIAGNOSIS — I509 Heart failure, unspecified: Secondary | ICD-10-CM | POA: Diagnosis not present

## 2019-11-13 DIAGNOSIS — N183 Chronic kidney disease, stage 3 unspecified: Secondary | ICD-10-CM | POA: Diagnosis not present

## 2019-11-13 DIAGNOSIS — F0390 Unspecified dementia without behavioral disturbance: Secondary | ICD-10-CM | POA: Diagnosis not present

## 2019-11-13 DIAGNOSIS — D631 Anemia in chronic kidney disease: Secondary | ICD-10-CM | POA: Diagnosis not present

## 2019-11-13 DIAGNOSIS — I130 Hypertensive heart and chronic kidney disease with heart failure and stage 1 through stage 4 chronic kidney disease, or unspecified chronic kidney disease: Secondary | ICD-10-CM | POA: Diagnosis not present

## 2019-11-13 DIAGNOSIS — I421 Obstructive hypertrophic cardiomyopathy: Secondary | ICD-10-CM | POA: Diagnosis not present

## 2019-11-18 DIAGNOSIS — N183 Chronic kidney disease, stage 3 unspecified: Secondary | ICD-10-CM | POA: Diagnosis not present

## 2019-11-18 DIAGNOSIS — D631 Anemia in chronic kidney disease: Secondary | ICD-10-CM | POA: Diagnosis not present

## 2019-11-18 DIAGNOSIS — F0390 Unspecified dementia without behavioral disturbance: Secondary | ICD-10-CM | POA: Diagnosis not present

## 2019-11-18 DIAGNOSIS — I509 Heart failure, unspecified: Secondary | ICD-10-CM | POA: Diagnosis not present

## 2019-11-18 DIAGNOSIS — I130 Hypertensive heart and chronic kidney disease with heart failure and stage 1 through stage 4 chronic kidney disease, or unspecified chronic kidney disease: Secondary | ICD-10-CM | POA: Diagnosis not present

## 2019-11-18 DIAGNOSIS — I421 Obstructive hypertrophic cardiomyopathy: Secondary | ICD-10-CM | POA: Diagnosis not present

## 2019-11-20 DIAGNOSIS — I509 Heart failure, unspecified: Secondary | ICD-10-CM | POA: Diagnosis not present

## 2019-11-20 DIAGNOSIS — I421 Obstructive hypertrophic cardiomyopathy: Secondary | ICD-10-CM | POA: Diagnosis not present

## 2019-11-20 DIAGNOSIS — F0390 Unspecified dementia without behavioral disturbance: Secondary | ICD-10-CM | POA: Diagnosis not present

## 2019-11-20 DIAGNOSIS — N183 Chronic kidney disease, stage 3 unspecified: Secondary | ICD-10-CM | POA: Diagnosis not present

## 2019-11-20 DIAGNOSIS — D631 Anemia in chronic kidney disease: Secondary | ICD-10-CM | POA: Diagnosis not present

## 2019-11-20 DIAGNOSIS — I130 Hypertensive heart and chronic kidney disease with heart failure and stage 1 through stage 4 chronic kidney disease, or unspecified chronic kidney disease: Secondary | ICD-10-CM | POA: Diagnosis not present

## 2019-11-25 DIAGNOSIS — N183 Chronic kidney disease, stage 3 unspecified: Secondary | ICD-10-CM | POA: Diagnosis not present

## 2019-11-25 DIAGNOSIS — I509 Heart failure, unspecified: Secondary | ICD-10-CM | POA: Diagnosis not present

## 2019-11-25 DIAGNOSIS — D631 Anemia in chronic kidney disease: Secondary | ICD-10-CM | POA: Diagnosis not present

## 2019-11-25 DIAGNOSIS — I130 Hypertensive heart and chronic kidney disease with heart failure and stage 1 through stage 4 chronic kidney disease, or unspecified chronic kidney disease: Secondary | ICD-10-CM | POA: Diagnosis not present

## 2019-11-25 DIAGNOSIS — F0390 Unspecified dementia without behavioral disturbance: Secondary | ICD-10-CM | POA: Diagnosis not present

## 2019-11-25 DIAGNOSIS — I421 Obstructive hypertrophic cardiomyopathy: Secondary | ICD-10-CM | POA: Diagnosis not present

## 2019-11-27 DIAGNOSIS — D631 Anemia in chronic kidney disease: Secondary | ICD-10-CM | POA: Diagnosis not present

## 2019-11-27 DIAGNOSIS — N183 Chronic kidney disease, stage 3 unspecified: Secondary | ICD-10-CM | POA: Diagnosis not present

## 2019-11-27 DIAGNOSIS — I509 Heart failure, unspecified: Secondary | ICD-10-CM | POA: Diagnosis not present

## 2019-11-27 DIAGNOSIS — I130 Hypertensive heart and chronic kidney disease with heart failure and stage 1 through stage 4 chronic kidney disease, or unspecified chronic kidney disease: Secondary | ICD-10-CM | POA: Diagnosis not present

## 2019-11-27 DIAGNOSIS — F0390 Unspecified dementia without behavioral disturbance: Secondary | ICD-10-CM | POA: Diagnosis not present

## 2019-11-27 DIAGNOSIS — I421 Obstructive hypertrophic cardiomyopathy: Secondary | ICD-10-CM | POA: Diagnosis not present

## 2019-11-28 DIAGNOSIS — N289 Disorder of kidney and ureter, unspecified: Secondary | ICD-10-CM | POA: Diagnosis not present

## 2019-11-28 DIAGNOSIS — I10 Essential (primary) hypertension: Secondary | ICD-10-CM | POA: Diagnosis not present

## 2019-11-28 DIAGNOSIS — F0390 Unspecified dementia without behavioral disturbance: Secondary | ICD-10-CM | POA: Diagnosis not present

## 2019-11-28 DIAGNOSIS — I509 Heart failure, unspecified: Secondary | ICD-10-CM | POA: Diagnosis not present

## 2019-11-28 DIAGNOSIS — N952 Postmenopausal atrophic vaginitis: Secondary | ICD-10-CM | POA: Diagnosis not present

## 2019-12-02 DIAGNOSIS — F0390 Unspecified dementia without behavioral disturbance: Secondary | ICD-10-CM | POA: Diagnosis not present

## 2019-12-02 DIAGNOSIS — I421 Obstructive hypertrophic cardiomyopathy: Secondary | ICD-10-CM | POA: Diagnosis not present

## 2019-12-02 DIAGNOSIS — I130 Hypertensive heart and chronic kidney disease with heart failure and stage 1 through stage 4 chronic kidney disease, or unspecified chronic kidney disease: Secondary | ICD-10-CM | POA: Diagnosis not present

## 2019-12-02 DIAGNOSIS — D631 Anemia in chronic kidney disease: Secondary | ICD-10-CM | POA: Diagnosis not present

## 2019-12-02 DIAGNOSIS — N183 Chronic kidney disease, stage 3 unspecified: Secondary | ICD-10-CM | POA: Diagnosis not present

## 2019-12-02 DIAGNOSIS — I509 Heart failure, unspecified: Secondary | ICD-10-CM | POA: Diagnosis not present

## 2019-12-04 DIAGNOSIS — S81811D Laceration without foreign body, right lower leg, subsequent encounter: Secondary | ICD-10-CM | POA: Diagnosis not present

## 2019-12-04 DIAGNOSIS — Z6821 Body mass index (BMI) 21.0-21.9, adult: Secondary | ICD-10-CM | POA: Diagnosis not present

## 2019-12-04 DIAGNOSIS — I130 Hypertensive heart and chronic kidney disease with heart failure and stage 1 through stage 4 chronic kidney disease, or unspecified chronic kidney disease: Secondary | ICD-10-CM | POA: Diagnosis not present

## 2019-12-04 DIAGNOSIS — N952 Postmenopausal atrophic vaginitis: Secondary | ICD-10-CM | POA: Diagnosis not present

## 2019-12-04 DIAGNOSIS — R32 Unspecified urinary incontinence: Secondary | ICD-10-CM | POA: Diagnosis not present

## 2019-12-04 DIAGNOSIS — Z87440 Personal history of urinary (tract) infections: Secondary | ICD-10-CM | POA: Diagnosis not present

## 2019-12-04 DIAGNOSIS — I509 Heart failure, unspecified: Secondary | ICD-10-CM | POA: Diagnosis not present

## 2019-12-04 DIAGNOSIS — S91204D Unspecified open wound of right lesser toe(s) with damage to nail, subsequent encounter: Secondary | ICD-10-CM | POA: Diagnosis not present

## 2019-12-04 DIAGNOSIS — R627 Adult failure to thrive: Secondary | ICD-10-CM | POA: Diagnosis not present

## 2019-12-04 DIAGNOSIS — D631 Anemia in chronic kidney disease: Secondary | ICD-10-CM | POA: Diagnosis not present

## 2019-12-04 DIAGNOSIS — I421 Obstructive hypertrophic cardiomyopathy: Secondary | ICD-10-CM | POA: Diagnosis not present

## 2019-12-04 DIAGNOSIS — R159 Full incontinence of feces: Secondary | ICD-10-CM | POA: Diagnosis not present

## 2019-12-04 DIAGNOSIS — Z993 Dependence on wheelchair: Secondary | ICD-10-CM | POA: Diagnosis not present

## 2019-12-04 DIAGNOSIS — N183 Chronic kidney disease, stage 3 unspecified: Secondary | ICD-10-CM | POA: Diagnosis not present

## 2019-12-04 DIAGNOSIS — F0390 Unspecified dementia without behavioral disturbance: Secondary | ICD-10-CM | POA: Diagnosis not present

## 2019-12-04 DIAGNOSIS — I700 Atherosclerosis of aorta: Secondary | ICD-10-CM | POA: Diagnosis not present

## 2019-12-08 DIAGNOSIS — F0390 Unspecified dementia without behavioral disturbance: Secondary | ICD-10-CM | POA: Diagnosis not present

## 2019-12-08 DIAGNOSIS — D631 Anemia in chronic kidney disease: Secondary | ICD-10-CM | POA: Diagnosis not present

## 2019-12-08 DIAGNOSIS — N183 Chronic kidney disease, stage 3 unspecified: Secondary | ICD-10-CM | POA: Diagnosis not present

## 2019-12-08 DIAGNOSIS — I130 Hypertensive heart and chronic kidney disease with heart failure and stage 1 through stage 4 chronic kidney disease, or unspecified chronic kidney disease: Secondary | ICD-10-CM | POA: Diagnosis not present

## 2019-12-08 DIAGNOSIS — I509 Heart failure, unspecified: Secondary | ICD-10-CM | POA: Diagnosis not present

## 2019-12-08 DIAGNOSIS — I421 Obstructive hypertrophic cardiomyopathy: Secondary | ICD-10-CM | POA: Diagnosis not present

## 2019-12-09 DIAGNOSIS — I509 Heart failure, unspecified: Secondary | ICD-10-CM | POA: Diagnosis not present

## 2019-12-09 DIAGNOSIS — I421 Obstructive hypertrophic cardiomyopathy: Secondary | ICD-10-CM | POA: Diagnosis not present

## 2019-12-09 DIAGNOSIS — F0390 Unspecified dementia without behavioral disturbance: Secondary | ICD-10-CM | POA: Diagnosis not present

## 2019-12-09 DIAGNOSIS — D631 Anemia in chronic kidney disease: Secondary | ICD-10-CM | POA: Diagnosis not present

## 2019-12-09 DIAGNOSIS — I130 Hypertensive heart and chronic kidney disease with heart failure and stage 1 through stage 4 chronic kidney disease, or unspecified chronic kidney disease: Secondary | ICD-10-CM | POA: Diagnosis not present

## 2019-12-09 DIAGNOSIS — N183 Chronic kidney disease, stage 3 unspecified: Secondary | ICD-10-CM | POA: Diagnosis not present

## 2019-12-11 DIAGNOSIS — F0390 Unspecified dementia without behavioral disturbance: Secondary | ICD-10-CM | POA: Diagnosis not present

## 2019-12-11 DIAGNOSIS — I509 Heart failure, unspecified: Secondary | ICD-10-CM | POA: Diagnosis not present

## 2019-12-11 DIAGNOSIS — D631 Anemia in chronic kidney disease: Secondary | ICD-10-CM | POA: Diagnosis not present

## 2019-12-11 DIAGNOSIS — N183 Chronic kidney disease, stage 3 unspecified: Secondary | ICD-10-CM | POA: Diagnosis not present

## 2019-12-11 DIAGNOSIS — I421 Obstructive hypertrophic cardiomyopathy: Secondary | ICD-10-CM | POA: Diagnosis not present

## 2019-12-11 DIAGNOSIS — I130 Hypertensive heart and chronic kidney disease with heart failure and stage 1 through stage 4 chronic kidney disease, or unspecified chronic kidney disease: Secondary | ICD-10-CM | POA: Diagnosis not present

## 2019-12-16 DIAGNOSIS — F0390 Unspecified dementia without behavioral disturbance: Secondary | ICD-10-CM | POA: Diagnosis not present

## 2019-12-16 DIAGNOSIS — I130 Hypertensive heart and chronic kidney disease with heart failure and stage 1 through stage 4 chronic kidney disease, or unspecified chronic kidney disease: Secondary | ICD-10-CM | POA: Diagnosis not present

## 2019-12-16 DIAGNOSIS — I421 Obstructive hypertrophic cardiomyopathy: Secondary | ICD-10-CM | POA: Diagnosis not present

## 2019-12-16 DIAGNOSIS — D631 Anemia in chronic kidney disease: Secondary | ICD-10-CM | POA: Diagnosis not present

## 2019-12-16 DIAGNOSIS — N183 Chronic kidney disease, stage 3 unspecified: Secondary | ICD-10-CM | POA: Diagnosis not present

## 2019-12-16 DIAGNOSIS — I509 Heart failure, unspecified: Secondary | ICD-10-CM | POA: Diagnosis not present

## 2019-12-18 DIAGNOSIS — D631 Anemia in chronic kidney disease: Secondary | ICD-10-CM | POA: Diagnosis not present

## 2019-12-18 DIAGNOSIS — I421 Obstructive hypertrophic cardiomyopathy: Secondary | ICD-10-CM | POA: Diagnosis not present

## 2019-12-18 DIAGNOSIS — I509 Heart failure, unspecified: Secondary | ICD-10-CM | POA: Diagnosis not present

## 2019-12-18 DIAGNOSIS — I130 Hypertensive heart and chronic kidney disease with heart failure and stage 1 through stage 4 chronic kidney disease, or unspecified chronic kidney disease: Secondary | ICD-10-CM | POA: Diagnosis not present

## 2019-12-18 DIAGNOSIS — N183 Chronic kidney disease, stage 3 unspecified: Secondary | ICD-10-CM | POA: Diagnosis not present

## 2019-12-18 DIAGNOSIS — F0390 Unspecified dementia without behavioral disturbance: Secondary | ICD-10-CM | POA: Diagnosis not present

## 2019-12-23 DIAGNOSIS — I421 Obstructive hypertrophic cardiomyopathy: Secondary | ICD-10-CM | POA: Diagnosis not present

## 2019-12-23 DIAGNOSIS — I130 Hypertensive heart and chronic kidney disease with heart failure and stage 1 through stage 4 chronic kidney disease, or unspecified chronic kidney disease: Secondary | ICD-10-CM | POA: Diagnosis not present

## 2019-12-23 DIAGNOSIS — F0390 Unspecified dementia without behavioral disturbance: Secondary | ICD-10-CM | POA: Diagnosis not present

## 2019-12-23 DIAGNOSIS — N183 Chronic kidney disease, stage 3 unspecified: Secondary | ICD-10-CM | POA: Diagnosis not present

## 2019-12-23 DIAGNOSIS — I509 Heart failure, unspecified: Secondary | ICD-10-CM | POA: Diagnosis not present

## 2019-12-23 DIAGNOSIS — D631 Anemia in chronic kidney disease: Secondary | ICD-10-CM | POA: Diagnosis not present

## 2019-12-25 DIAGNOSIS — I130 Hypertensive heart and chronic kidney disease with heart failure and stage 1 through stage 4 chronic kidney disease, or unspecified chronic kidney disease: Secondary | ICD-10-CM | POA: Diagnosis not present

## 2019-12-25 DIAGNOSIS — I509 Heart failure, unspecified: Secondary | ICD-10-CM | POA: Diagnosis not present

## 2019-12-25 DIAGNOSIS — F0390 Unspecified dementia without behavioral disturbance: Secondary | ICD-10-CM | POA: Diagnosis not present

## 2019-12-25 DIAGNOSIS — I421 Obstructive hypertrophic cardiomyopathy: Secondary | ICD-10-CM | POA: Diagnosis not present

## 2019-12-25 DIAGNOSIS — N183 Chronic kidney disease, stage 3 unspecified: Secondary | ICD-10-CM | POA: Diagnosis not present

## 2019-12-25 DIAGNOSIS — D631 Anemia in chronic kidney disease: Secondary | ICD-10-CM | POA: Diagnosis not present

## 2019-12-30 DIAGNOSIS — I130 Hypertensive heart and chronic kidney disease with heart failure and stage 1 through stage 4 chronic kidney disease, or unspecified chronic kidney disease: Secondary | ICD-10-CM | POA: Diagnosis not present

## 2019-12-30 DIAGNOSIS — I421 Obstructive hypertrophic cardiomyopathy: Secondary | ICD-10-CM | POA: Diagnosis not present

## 2019-12-30 DIAGNOSIS — F0390 Unspecified dementia without behavioral disturbance: Secondary | ICD-10-CM | POA: Diagnosis not present

## 2019-12-30 DIAGNOSIS — D631 Anemia in chronic kidney disease: Secondary | ICD-10-CM | POA: Diagnosis not present

## 2019-12-30 DIAGNOSIS — N183 Chronic kidney disease, stage 3 unspecified: Secondary | ICD-10-CM | POA: Diagnosis not present

## 2019-12-30 DIAGNOSIS — I509 Heart failure, unspecified: Secondary | ICD-10-CM | POA: Diagnosis not present

## 2019-12-31 DIAGNOSIS — D631 Anemia in chronic kidney disease: Secondary | ICD-10-CM | POA: Diagnosis not present

## 2019-12-31 DIAGNOSIS — N183 Chronic kidney disease, stage 3 unspecified: Secondary | ICD-10-CM | POA: Diagnosis not present

## 2019-12-31 DIAGNOSIS — F0390 Unspecified dementia without behavioral disturbance: Secondary | ICD-10-CM | POA: Diagnosis not present

## 2019-12-31 DIAGNOSIS — I509 Heart failure, unspecified: Secondary | ICD-10-CM | POA: Diagnosis not present

## 2019-12-31 DIAGNOSIS — I421 Obstructive hypertrophic cardiomyopathy: Secondary | ICD-10-CM | POA: Diagnosis not present

## 2019-12-31 DIAGNOSIS — I130 Hypertensive heart and chronic kidney disease with heart failure and stage 1 through stage 4 chronic kidney disease, or unspecified chronic kidney disease: Secondary | ICD-10-CM | POA: Diagnosis not present

## 2020-01-01 DIAGNOSIS — D631 Anemia in chronic kidney disease: Secondary | ICD-10-CM | POA: Diagnosis not present

## 2020-01-01 DIAGNOSIS — I421 Obstructive hypertrophic cardiomyopathy: Secondary | ICD-10-CM | POA: Diagnosis not present

## 2020-01-01 DIAGNOSIS — N183 Chronic kidney disease, stage 3 unspecified: Secondary | ICD-10-CM | POA: Diagnosis not present

## 2020-01-01 DIAGNOSIS — I130 Hypertensive heart and chronic kidney disease with heart failure and stage 1 through stage 4 chronic kidney disease, or unspecified chronic kidney disease: Secondary | ICD-10-CM | POA: Diagnosis not present

## 2020-01-01 DIAGNOSIS — I509 Heart failure, unspecified: Secondary | ICD-10-CM | POA: Diagnosis not present

## 2020-01-01 DIAGNOSIS — F0390 Unspecified dementia without behavioral disturbance: Secondary | ICD-10-CM | POA: Diagnosis not present

## 2020-01-02 DIAGNOSIS — N183 Chronic kidney disease, stage 3 unspecified: Secondary | ICD-10-CM | POA: Diagnosis not present

## 2020-01-02 DIAGNOSIS — I130 Hypertensive heart and chronic kidney disease with heart failure and stage 1 through stage 4 chronic kidney disease, or unspecified chronic kidney disease: Secondary | ICD-10-CM | POA: Diagnosis not present

## 2020-01-02 DIAGNOSIS — F0390 Unspecified dementia without behavioral disturbance: Secondary | ICD-10-CM | POA: Diagnosis not present

## 2020-01-02 DIAGNOSIS — I421 Obstructive hypertrophic cardiomyopathy: Secondary | ICD-10-CM | POA: Diagnosis not present

## 2020-01-02 DIAGNOSIS — I509 Heart failure, unspecified: Secondary | ICD-10-CM | POA: Diagnosis not present

## 2020-01-02 DIAGNOSIS — D631 Anemia in chronic kidney disease: Secondary | ICD-10-CM | POA: Diagnosis not present

## 2020-01-03 DIAGNOSIS — D631 Anemia in chronic kidney disease: Secondary | ICD-10-CM | POA: Diagnosis not present

## 2020-01-03 DIAGNOSIS — Z993 Dependence on wheelchair: Secondary | ICD-10-CM | POA: Diagnosis not present

## 2020-01-03 DIAGNOSIS — R159 Full incontinence of feces: Secondary | ICD-10-CM | POA: Diagnosis not present

## 2020-01-03 DIAGNOSIS — Z87440 Personal history of urinary (tract) infections: Secondary | ICD-10-CM | POA: Diagnosis not present

## 2020-01-03 DIAGNOSIS — F0390 Unspecified dementia without behavioral disturbance: Secondary | ICD-10-CM | POA: Diagnosis not present

## 2020-01-03 DIAGNOSIS — R627 Adult failure to thrive: Secondary | ICD-10-CM | POA: Diagnosis not present

## 2020-01-03 DIAGNOSIS — I130 Hypertensive heart and chronic kidney disease with heart failure and stage 1 through stage 4 chronic kidney disease, or unspecified chronic kidney disease: Secondary | ICD-10-CM | POA: Diagnosis not present

## 2020-01-03 DIAGNOSIS — N183 Chronic kidney disease, stage 3 unspecified: Secondary | ICD-10-CM | POA: Diagnosis not present

## 2020-01-03 DIAGNOSIS — I509 Heart failure, unspecified: Secondary | ICD-10-CM | POA: Diagnosis not present

## 2020-01-03 DIAGNOSIS — I421 Obstructive hypertrophic cardiomyopathy: Secondary | ICD-10-CM | POA: Diagnosis not present

## 2020-01-03 DIAGNOSIS — S91204D Unspecified open wound of right lesser toe(s) with damage to nail, subsequent encounter: Secondary | ICD-10-CM | POA: Diagnosis not present

## 2020-01-03 DIAGNOSIS — S81811D Laceration without foreign body, right lower leg, subsequent encounter: Secondary | ICD-10-CM | POA: Diagnosis not present

## 2020-01-03 DIAGNOSIS — I700 Atherosclerosis of aorta: Secondary | ICD-10-CM | POA: Diagnosis not present

## 2020-01-03 DIAGNOSIS — Z6821 Body mass index (BMI) 21.0-21.9, adult: Secondary | ICD-10-CM | POA: Diagnosis not present

## 2020-01-03 DIAGNOSIS — R32 Unspecified urinary incontinence: Secondary | ICD-10-CM | POA: Diagnosis not present

## 2020-01-03 DIAGNOSIS — N952 Postmenopausal atrophic vaginitis: Secondary | ICD-10-CM | POA: Diagnosis not present

## 2020-01-05 DIAGNOSIS — I421 Obstructive hypertrophic cardiomyopathy: Secondary | ICD-10-CM | POA: Diagnosis not present

## 2020-01-05 DIAGNOSIS — D631 Anemia in chronic kidney disease: Secondary | ICD-10-CM | POA: Diagnosis not present

## 2020-01-05 DIAGNOSIS — I130 Hypertensive heart and chronic kidney disease with heart failure and stage 1 through stage 4 chronic kidney disease, or unspecified chronic kidney disease: Secondary | ICD-10-CM | POA: Diagnosis not present

## 2020-01-05 DIAGNOSIS — F0390 Unspecified dementia without behavioral disturbance: Secondary | ICD-10-CM | POA: Diagnosis not present

## 2020-01-05 DIAGNOSIS — I509 Heart failure, unspecified: Secondary | ICD-10-CM | POA: Diagnosis not present

## 2020-01-05 DIAGNOSIS — N183 Chronic kidney disease, stage 3 unspecified: Secondary | ICD-10-CM | POA: Diagnosis not present

## 2020-01-06 DIAGNOSIS — F0390 Unspecified dementia without behavioral disturbance: Secondary | ICD-10-CM | POA: Diagnosis not present

## 2020-01-06 DIAGNOSIS — I509 Heart failure, unspecified: Secondary | ICD-10-CM | POA: Diagnosis not present

## 2020-01-06 DIAGNOSIS — N183 Chronic kidney disease, stage 3 unspecified: Secondary | ICD-10-CM | POA: Diagnosis not present

## 2020-01-06 DIAGNOSIS — I421 Obstructive hypertrophic cardiomyopathy: Secondary | ICD-10-CM | POA: Diagnosis not present

## 2020-01-06 DIAGNOSIS — D631 Anemia in chronic kidney disease: Secondary | ICD-10-CM | POA: Diagnosis not present

## 2020-01-06 DIAGNOSIS — I130 Hypertensive heart and chronic kidney disease with heart failure and stage 1 through stage 4 chronic kidney disease, or unspecified chronic kidney disease: Secondary | ICD-10-CM | POA: Diagnosis not present

## 2020-01-08 DIAGNOSIS — F0390 Unspecified dementia without behavioral disturbance: Secondary | ICD-10-CM | POA: Diagnosis not present

## 2020-01-08 DIAGNOSIS — N183 Chronic kidney disease, stage 3 unspecified: Secondary | ICD-10-CM | POA: Diagnosis not present

## 2020-01-08 DIAGNOSIS — I130 Hypertensive heart and chronic kidney disease with heart failure and stage 1 through stage 4 chronic kidney disease, or unspecified chronic kidney disease: Secondary | ICD-10-CM | POA: Diagnosis not present

## 2020-01-08 DIAGNOSIS — I421 Obstructive hypertrophic cardiomyopathy: Secondary | ICD-10-CM | POA: Diagnosis not present

## 2020-01-08 DIAGNOSIS — D631 Anemia in chronic kidney disease: Secondary | ICD-10-CM | POA: Diagnosis not present

## 2020-01-08 DIAGNOSIS — I509 Heart failure, unspecified: Secondary | ICD-10-CM | POA: Diagnosis not present

## 2020-01-09 DIAGNOSIS — I739 Peripheral vascular disease, unspecified: Secondary | ICD-10-CM | POA: Diagnosis not present

## 2020-01-09 DIAGNOSIS — F0390 Unspecified dementia without behavioral disturbance: Secondary | ICD-10-CM | POA: Diagnosis not present

## 2020-01-09 DIAGNOSIS — D631 Anemia in chronic kidney disease: Secondary | ICD-10-CM | POA: Diagnosis not present

## 2020-01-09 DIAGNOSIS — N183 Chronic kidney disease, stage 3 unspecified: Secondary | ICD-10-CM | POA: Diagnosis not present

## 2020-01-09 DIAGNOSIS — I130 Hypertensive heart and chronic kidney disease with heart failure and stage 1 through stage 4 chronic kidney disease, or unspecified chronic kidney disease: Secondary | ICD-10-CM | POA: Diagnosis not present

## 2020-01-09 DIAGNOSIS — Q845 Enlarged and hypertrophic nails: Secondary | ICD-10-CM | POA: Diagnosis not present

## 2020-01-09 DIAGNOSIS — I421 Obstructive hypertrophic cardiomyopathy: Secondary | ICD-10-CM | POA: Diagnosis not present

## 2020-01-09 DIAGNOSIS — B351 Tinea unguium: Secondary | ICD-10-CM | POA: Diagnosis not present

## 2020-01-09 DIAGNOSIS — I509 Heart failure, unspecified: Secondary | ICD-10-CM | POA: Diagnosis not present

## 2020-01-12 DIAGNOSIS — F0390 Unspecified dementia without behavioral disturbance: Secondary | ICD-10-CM | POA: Diagnosis not present

## 2020-01-12 DIAGNOSIS — I509 Heart failure, unspecified: Secondary | ICD-10-CM | POA: Diagnosis not present

## 2020-01-12 DIAGNOSIS — D631 Anemia in chronic kidney disease: Secondary | ICD-10-CM | POA: Diagnosis not present

## 2020-01-12 DIAGNOSIS — N183 Chronic kidney disease, stage 3 unspecified: Secondary | ICD-10-CM | POA: Diagnosis not present

## 2020-01-12 DIAGNOSIS — I130 Hypertensive heart and chronic kidney disease with heart failure and stage 1 through stage 4 chronic kidney disease, or unspecified chronic kidney disease: Secondary | ICD-10-CM | POA: Diagnosis not present

## 2020-01-12 DIAGNOSIS — I421 Obstructive hypertrophic cardiomyopathy: Secondary | ICD-10-CM | POA: Diagnosis not present

## 2020-01-14 DIAGNOSIS — D631 Anemia in chronic kidney disease: Secondary | ICD-10-CM | POA: Diagnosis not present

## 2020-01-14 DIAGNOSIS — N183 Chronic kidney disease, stage 3 unspecified: Secondary | ICD-10-CM | POA: Diagnosis not present

## 2020-01-14 DIAGNOSIS — I421 Obstructive hypertrophic cardiomyopathy: Secondary | ICD-10-CM | POA: Diagnosis not present

## 2020-01-14 DIAGNOSIS — F0390 Unspecified dementia without behavioral disturbance: Secondary | ICD-10-CM | POA: Diagnosis not present

## 2020-01-14 DIAGNOSIS — I509 Heart failure, unspecified: Secondary | ICD-10-CM | POA: Diagnosis not present

## 2020-01-14 DIAGNOSIS — I130 Hypertensive heart and chronic kidney disease with heart failure and stage 1 through stage 4 chronic kidney disease, or unspecified chronic kidney disease: Secondary | ICD-10-CM | POA: Diagnosis not present

## 2020-01-15 DIAGNOSIS — I421 Obstructive hypertrophic cardiomyopathy: Secondary | ICD-10-CM | POA: Diagnosis not present

## 2020-01-15 DIAGNOSIS — D631 Anemia in chronic kidney disease: Secondary | ICD-10-CM | POA: Diagnosis not present

## 2020-01-15 DIAGNOSIS — F0390 Unspecified dementia without behavioral disturbance: Secondary | ICD-10-CM | POA: Diagnosis not present

## 2020-01-15 DIAGNOSIS — N183 Chronic kidney disease, stage 3 unspecified: Secondary | ICD-10-CM | POA: Diagnosis not present

## 2020-01-15 DIAGNOSIS — I130 Hypertensive heart and chronic kidney disease with heart failure and stage 1 through stage 4 chronic kidney disease, or unspecified chronic kidney disease: Secondary | ICD-10-CM | POA: Diagnosis not present

## 2020-01-15 DIAGNOSIS — I509 Heart failure, unspecified: Secondary | ICD-10-CM | POA: Diagnosis not present

## 2020-01-16 DIAGNOSIS — I509 Heart failure, unspecified: Secondary | ICD-10-CM | POA: Diagnosis not present

## 2020-01-16 DIAGNOSIS — F0390 Unspecified dementia without behavioral disturbance: Secondary | ICD-10-CM | POA: Diagnosis not present

## 2020-01-16 DIAGNOSIS — D631 Anemia in chronic kidney disease: Secondary | ICD-10-CM | POA: Diagnosis not present

## 2020-01-16 DIAGNOSIS — I421 Obstructive hypertrophic cardiomyopathy: Secondary | ICD-10-CM | POA: Diagnosis not present

## 2020-01-16 DIAGNOSIS — I130 Hypertensive heart and chronic kidney disease with heart failure and stage 1 through stage 4 chronic kidney disease, or unspecified chronic kidney disease: Secondary | ICD-10-CM | POA: Diagnosis not present

## 2020-01-16 DIAGNOSIS — N183 Chronic kidney disease, stage 3 unspecified: Secondary | ICD-10-CM | POA: Diagnosis not present

## 2020-01-20 DIAGNOSIS — F0390 Unspecified dementia without behavioral disturbance: Secondary | ICD-10-CM | POA: Diagnosis not present

## 2020-01-20 DIAGNOSIS — N183 Chronic kidney disease, stage 3 unspecified: Secondary | ICD-10-CM | POA: Diagnosis not present

## 2020-01-20 DIAGNOSIS — I509 Heart failure, unspecified: Secondary | ICD-10-CM | POA: Diagnosis not present

## 2020-01-20 DIAGNOSIS — D631 Anemia in chronic kidney disease: Secondary | ICD-10-CM | POA: Diagnosis not present

## 2020-01-20 DIAGNOSIS — I130 Hypertensive heart and chronic kidney disease with heart failure and stage 1 through stage 4 chronic kidney disease, or unspecified chronic kidney disease: Secondary | ICD-10-CM | POA: Diagnosis not present

## 2020-01-20 DIAGNOSIS — I421 Obstructive hypertrophic cardiomyopathy: Secondary | ICD-10-CM | POA: Diagnosis not present

## 2020-01-23 DIAGNOSIS — N183 Chronic kidney disease, stage 3 unspecified: Secondary | ICD-10-CM | POA: Diagnosis not present

## 2020-01-23 DIAGNOSIS — F0390 Unspecified dementia without behavioral disturbance: Secondary | ICD-10-CM | POA: Diagnosis not present

## 2020-01-23 DIAGNOSIS — D631 Anemia in chronic kidney disease: Secondary | ICD-10-CM | POA: Diagnosis not present

## 2020-01-23 DIAGNOSIS — I130 Hypertensive heart and chronic kidney disease with heart failure and stage 1 through stage 4 chronic kidney disease, or unspecified chronic kidney disease: Secondary | ICD-10-CM | POA: Diagnosis not present

## 2020-01-23 DIAGNOSIS — I421 Obstructive hypertrophic cardiomyopathy: Secondary | ICD-10-CM | POA: Diagnosis not present

## 2020-01-23 DIAGNOSIS — I509 Heart failure, unspecified: Secondary | ICD-10-CM | POA: Diagnosis not present

## 2020-01-27 DIAGNOSIS — I509 Heart failure, unspecified: Secondary | ICD-10-CM | POA: Diagnosis not present

## 2020-01-27 DIAGNOSIS — I421 Obstructive hypertrophic cardiomyopathy: Secondary | ICD-10-CM | POA: Diagnosis not present

## 2020-01-27 DIAGNOSIS — I130 Hypertensive heart and chronic kidney disease with heart failure and stage 1 through stage 4 chronic kidney disease, or unspecified chronic kidney disease: Secondary | ICD-10-CM | POA: Diagnosis not present

## 2020-01-27 DIAGNOSIS — N183 Chronic kidney disease, stage 3 unspecified: Secondary | ICD-10-CM | POA: Diagnosis not present

## 2020-01-27 DIAGNOSIS — F0390 Unspecified dementia without behavioral disturbance: Secondary | ICD-10-CM | POA: Diagnosis not present

## 2020-01-27 DIAGNOSIS — D631 Anemia in chronic kidney disease: Secondary | ICD-10-CM | POA: Diagnosis not present

## 2020-01-28 DIAGNOSIS — I421 Obstructive hypertrophic cardiomyopathy: Secondary | ICD-10-CM | POA: Diagnosis not present

## 2020-01-28 DIAGNOSIS — N390 Urinary tract infection, site not specified: Secondary | ICD-10-CM | POA: Diagnosis not present

## 2020-01-28 DIAGNOSIS — I509 Heart failure, unspecified: Secondary | ICD-10-CM | POA: Diagnosis not present

## 2020-01-28 DIAGNOSIS — N183 Chronic kidney disease, stage 3 unspecified: Secondary | ICD-10-CM | POA: Diagnosis not present

## 2020-01-28 DIAGNOSIS — D631 Anemia in chronic kidney disease: Secondary | ICD-10-CM | POA: Diagnosis not present

## 2020-01-28 DIAGNOSIS — F0390 Unspecified dementia without behavioral disturbance: Secondary | ICD-10-CM | POA: Diagnosis not present

## 2020-01-28 DIAGNOSIS — I130 Hypertensive heart and chronic kidney disease with heart failure and stage 1 through stage 4 chronic kidney disease, or unspecified chronic kidney disease: Secondary | ICD-10-CM | POA: Diagnosis not present

## 2020-01-30 DIAGNOSIS — I130 Hypertensive heart and chronic kidney disease with heart failure and stage 1 through stage 4 chronic kidney disease, or unspecified chronic kidney disease: Secondary | ICD-10-CM | POA: Diagnosis not present

## 2020-01-30 DIAGNOSIS — N183 Chronic kidney disease, stage 3 unspecified: Secondary | ICD-10-CM | POA: Diagnosis not present

## 2020-01-30 DIAGNOSIS — I509 Heart failure, unspecified: Secondary | ICD-10-CM | POA: Diagnosis not present

## 2020-01-30 DIAGNOSIS — F0390 Unspecified dementia without behavioral disturbance: Secondary | ICD-10-CM | POA: Diagnosis not present

## 2020-01-30 DIAGNOSIS — D631 Anemia in chronic kidney disease: Secondary | ICD-10-CM | POA: Diagnosis not present

## 2020-01-30 DIAGNOSIS — I421 Obstructive hypertrophic cardiomyopathy: Secondary | ICD-10-CM | POA: Diagnosis not present

## 2020-02-01 DIAGNOSIS — I509 Heart failure, unspecified: Secondary | ICD-10-CM | POA: Diagnosis not present

## 2020-02-01 DIAGNOSIS — D631 Anemia in chronic kidney disease: Secondary | ICD-10-CM | POA: Diagnosis not present

## 2020-02-01 DIAGNOSIS — N183 Chronic kidney disease, stage 3 unspecified: Secondary | ICD-10-CM | POA: Diagnosis not present

## 2020-02-01 DIAGNOSIS — F0390 Unspecified dementia without behavioral disturbance: Secondary | ICD-10-CM | POA: Diagnosis not present

## 2020-02-01 DIAGNOSIS — I130 Hypertensive heart and chronic kidney disease with heart failure and stage 1 through stage 4 chronic kidney disease, or unspecified chronic kidney disease: Secondary | ICD-10-CM | POA: Diagnosis not present

## 2020-02-01 DIAGNOSIS — I421 Obstructive hypertrophic cardiomyopathy: Secondary | ICD-10-CM | POA: Diagnosis not present

## 2020-02-03 DIAGNOSIS — F0390 Unspecified dementia without behavioral disturbance: Secondary | ICD-10-CM | POA: Diagnosis not present

## 2020-02-03 DIAGNOSIS — R627 Adult failure to thrive: Secondary | ICD-10-CM | POA: Diagnosis not present

## 2020-02-03 DIAGNOSIS — I700 Atherosclerosis of aorta: Secondary | ICD-10-CM | POA: Diagnosis not present

## 2020-02-03 DIAGNOSIS — R32 Unspecified urinary incontinence: Secondary | ICD-10-CM | POA: Diagnosis not present

## 2020-02-03 DIAGNOSIS — Z87440 Personal history of urinary (tract) infections: Secondary | ICD-10-CM | POA: Diagnosis not present

## 2020-02-03 DIAGNOSIS — N952 Postmenopausal atrophic vaginitis: Secondary | ICD-10-CM | POA: Diagnosis not present

## 2020-02-03 DIAGNOSIS — N183 Chronic kidney disease, stage 3 unspecified: Secondary | ICD-10-CM | POA: Diagnosis not present

## 2020-02-03 DIAGNOSIS — I509 Heart failure, unspecified: Secondary | ICD-10-CM | POA: Diagnosis not present

## 2020-02-03 DIAGNOSIS — R159 Full incontinence of feces: Secondary | ICD-10-CM | POA: Diagnosis not present

## 2020-02-03 DIAGNOSIS — S91204D Unspecified open wound of right lesser toe(s) with damage to nail, subsequent encounter: Secondary | ICD-10-CM | POA: Diagnosis not present

## 2020-02-03 DIAGNOSIS — I421 Obstructive hypertrophic cardiomyopathy: Secondary | ICD-10-CM | POA: Diagnosis not present

## 2020-02-03 DIAGNOSIS — I130 Hypertensive heart and chronic kidney disease with heart failure and stage 1 through stage 4 chronic kidney disease, or unspecified chronic kidney disease: Secondary | ICD-10-CM | POA: Diagnosis not present

## 2020-02-03 DIAGNOSIS — D631 Anemia in chronic kidney disease: Secondary | ICD-10-CM | POA: Diagnosis not present

## 2020-02-03 DIAGNOSIS — S81811D Laceration without foreign body, right lower leg, subsequent encounter: Secondary | ICD-10-CM | POA: Diagnosis not present

## 2020-02-03 DIAGNOSIS — Z6821 Body mass index (BMI) 21.0-21.9, adult: Secondary | ICD-10-CM | POA: Diagnosis not present

## 2020-02-03 DIAGNOSIS — Z993 Dependence on wheelchair: Secondary | ICD-10-CM | POA: Diagnosis not present

## 2020-02-05 DIAGNOSIS — I421 Obstructive hypertrophic cardiomyopathy: Secondary | ICD-10-CM | POA: Diagnosis not present

## 2020-02-05 DIAGNOSIS — I509 Heart failure, unspecified: Secondary | ICD-10-CM | POA: Diagnosis not present

## 2020-02-05 DIAGNOSIS — N183 Chronic kidney disease, stage 3 unspecified: Secondary | ICD-10-CM | POA: Diagnosis not present

## 2020-02-05 DIAGNOSIS — I130 Hypertensive heart and chronic kidney disease with heart failure and stage 1 through stage 4 chronic kidney disease, or unspecified chronic kidney disease: Secondary | ICD-10-CM | POA: Diagnosis not present

## 2020-02-05 DIAGNOSIS — F0390 Unspecified dementia without behavioral disturbance: Secondary | ICD-10-CM | POA: Diagnosis not present

## 2020-02-05 DIAGNOSIS — D631 Anemia in chronic kidney disease: Secondary | ICD-10-CM | POA: Diagnosis not present

## 2020-02-06 DIAGNOSIS — N183 Chronic kidney disease, stage 3 unspecified: Secondary | ICD-10-CM | POA: Diagnosis not present

## 2020-02-06 DIAGNOSIS — D631 Anemia in chronic kidney disease: Secondary | ICD-10-CM | POA: Diagnosis not present

## 2020-02-06 DIAGNOSIS — I130 Hypertensive heart and chronic kidney disease with heart failure and stage 1 through stage 4 chronic kidney disease, or unspecified chronic kidney disease: Secondary | ICD-10-CM | POA: Diagnosis not present

## 2020-02-06 DIAGNOSIS — I509 Heart failure, unspecified: Secondary | ICD-10-CM | POA: Diagnosis not present

## 2020-02-06 DIAGNOSIS — F0390 Unspecified dementia without behavioral disturbance: Secondary | ICD-10-CM | POA: Diagnosis not present

## 2020-02-06 DIAGNOSIS — I421 Obstructive hypertrophic cardiomyopathy: Secondary | ICD-10-CM | POA: Diagnosis not present

## 2020-02-10 DIAGNOSIS — I421 Obstructive hypertrophic cardiomyopathy: Secondary | ICD-10-CM | POA: Diagnosis not present

## 2020-02-10 DIAGNOSIS — D631 Anemia in chronic kidney disease: Secondary | ICD-10-CM | POA: Diagnosis not present

## 2020-02-10 DIAGNOSIS — I509 Heart failure, unspecified: Secondary | ICD-10-CM | POA: Diagnosis not present

## 2020-02-10 DIAGNOSIS — F0390 Unspecified dementia without behavioral disturbance: Secondary | ICD-10-CM | POA: Diagnosis not present

## 2020-02-10 DIAGNOSIS — N183 Chronic kidney disease, stage 3 unspecified: Secondary | ICD-10-CM | POA: Diagnosis not present

## 2020-02-10 DIAGNOSIS — I130 Hypertensive heart and chronic kidney disease with heart failure and stage 1 through stage 4 chronic kidney disease, or unspecified chronic kidney disease: Secondary | ICD-10-CM | POA: Diagnosis not present

## 2020-02-12 DIAGNOSIS — N183 Chronic kidney disease, stage 3 unspecified: Secondary | ICD-10-CM | POA: Diagnosis not present

## 2020-02-12 DIAGNOSIS — D631 Anemia in chronic kidney disease: Secondary | ICD-10-CM | POA: Diagnosis not present

## 2020-02-12 DIAGNOSIS — F0390 Unspecified dementia without behavioral disturbance: Secondary | ICD-10-CM | POA: Diagnosis not present

## 2020-02-12 DIAGNOSIS — I130 Hypertensive heart and chronic kidney disease with heart failure and stage 1 through stage 4 chronic kidney disease, or unspecified chronic kidney disease: Secondary | ICD-10-CM | POA: Diagnosis not present

## 2020-02-12 DIAGNOSIS — I509 Heart failure, unspecified: Secondary | ICD-10-CM | POA: Diagnosis not present

## 2020-02-12 DIAGNOSIS — I421 Obstructive hypertrophic cardiomyopathy: Secondary | ICD-10-CM | POA: Diagnosis not present

## 2020-02-13 DIAGNOSIS — D631 Anemia in chronic kidney disease: Secondary | ICD-10-CM | POA: Diagnosis not present

## 2020-02-13 DIAGNOSIS — F0390 Unspecified dementia without behavioral disturbance: Secondary | ICD-10-CM | POA: Diagnosis not present

## 2020-02-13 DIAGNOSIS — I509 Heart failure, unspecified: Secondary | ICD-10-CM | POA: Diagnosis not present

## 2020-02-13 DIAGNOSIS — I421 Obstructive hypertrophic cardiomyopathy: Secondary | ICD-10-CM | POA: Diagnosis not present

## 2020-02-13 DIAGNOSIS — N183 Chronic kidney disease, stage 3 unspecified: Secondary | ICD-10-CM | POA: Diagnosis not present

## 2020-02-13 DIAGNOSIS — I130 Hypertensive heart and chronic kidney disease with heart failure and stage 1 through stage 4 chronic kidney disease, or unspecified chronic kidney disease: Secondary | ICD-10-CM | POA: Diagnosis not present

## 2020-02-16 DIAGNOSIS — D631 Anemia in chronic kidney disease: Secondary | ICD-10-CM | POA: Diagnosis not present

## 2020-02-16 DIAGNOSIS — N183 Chronic kidney disease, stage 3 unspecified: Secondary | ICD-10-CM | POA: Diagnosis not present

## 2020-02-16 DIAGNOSIS — I421 Obstructive hypertrophic cardiomyopathy: Secondary | ICD-10-CM | POA: Diagnosis not present

## 2020-02-16 DIAGNOSIS — F0390 Unspecified dementia without behavioral disturbance: Secondary | ICD-10-CM | POA: Diagnosis not present

## 2020-02-16 DIAGNOSIS — I130 Hypertensive heart and chronic kidney disease with heart failure and stage 1 through stage 4 chronic kidney disease, or unspecified chronic kidney disease: Secondary | ICD-10-CM | POA: Diagnosis not present

## 2020-02-16 DIAGNOSIS — I509 Heart failure, unspecified: Secondary | ICD-10-CM | POA: Diagnosis not present

## 2020-02-17 DIAGNOSIS — I421 Obstructive hypertrophic cardiomyopathy: Secondary | ICD-10-CM | POA: Diagnosis not present

## 2020-02-17 DIAGNOSIS — F0390 Unspecified dementia without behavioral disturbance: Secondary | ICD-10-CM | POA: Diagnosis not present

## 2020-02-17 DIAGNOSIS — I509 Heart failure, unspecified: Secondary | ICD-10-CM | POA: Diagnosis not present

## 2020-02-17 DIAGNOSIS — I130 Hypertensive heart and chronic kidney disease with heart failure and stage 1 through stage 4 chronic kidney disease, or unspecified chronic kidney disease: Secondary | ICD-10-CM | POA: Diagnosis not present

## 2020-02-17 DIAGNOSIS — D631 Anemia in chronic kidney disease: Secondary | ICD-10-CM | POA: Diagnosis not present

## 2020-02-17 DIAGNOSIS — N183 Chronic kidney disease, stage 3 unspecified: Secondary | ICD-10-CM | POA: Diagnosis not present

## 2020-02-19 DIAGNOSIS — N183 Chronic kidney disease, stage 3 unspecified: Secondary | ICD-10-CM | POA: Diagnosis not present

## 2020-02-19 DIAGNOSIS — I509 Heart failure, unspecified: Secondary | ICD-10-CM | POA: Diagnosis not present

## 2020-02-19 DIAGNOSIS — I421 Obstructive hypertrophic cardiomyopathy: Secondary | ICD-10-CM | POA: Diagnosis not present

## 2020-02-19 DIAGNOSIS — F0390 Unspecified dementia without behavioral disturbance: Secondary | ICD-10-CM | POA: Diagnosis not present

## 2020-02-19 DIAGNOSIS — D631 Anemia in chronic kidney disease: Secondary | ICD-10-CM | POA: Diagnosis not present

## 2020-02-19 DIAGNOSIS — I130 Hypertensive heart and chronic kidney disease with heart failure and stage 1 through stage 4 chronic kidney disease, or unspecified chronic kidney disease: Secondary | ICD-10-CM | POA: Diagnosis not present

## 2020-02-20 DIAGNOSIS — I130 Hypertensive heart and chronic kidney disease with heart failure and stage 1 through stage 4 chronic kidney disease, or unspecified chronic kidney disease: Secondary | ICD-10-CM | POA: Diagnosis not present

## 2020-02-20 DIAGNOSIS — I421 Obstructive hypertrophic cardiomyopathy: Secondary | ICD-10-CM | POA: Diagnosis not present

## 2020-02-20 DIAGNOSIS — N183 Chronic kidney disease, stage 3 unspecified: Secondary | ICD-10-CM | POA: Diagnosis not present

## 2020-02-20 DIAGNOSIS — I509 Heart failure, unspecified: Secondary | ICD-10-CM | POA: Diagnosis not present

## 2020-02-20 DIAGNOSIS — D631 Anemia in chronic kidney disease: Secondary | ICD-10-CM | POA: Diagnosis not present

## 2020-02-20 DIAGNOSIS — F0390 Unspecified dementia without behavioral disturbance: Secondary | ICD-10-CM | POA: Diagnosis not present

## 2020-02-24 DIAGNOSIS — I421 Obstructive hypertrophic cardiomyopathy: Secondary | ICD-10-CM | POA: Diagnosis not present

## 2020-02-24 DIAGNOSIS — N183 Chronic kidney disease, stage 3 unspecified: Secondary | ICD-10-CM | POA: Diagnosis not present

## 2020-02-24 DIAGNOSIS — I509 Heart failure, unspecified: Secondary | ICD-10-CM | POA: Diagnosis not present

## 2020-02-24 DIAGNOSIS — I130 Hypertensive heart and chronic kidney disease with heart failure and stage 1 through stage 4 chronic kidney disease, or unspecified chronic kidney disease: Secondary | ICD-10-CM | POA: Diagnosis not present

## 2020-02-24 DIAGNOSIS — D631 Anemia in chronic kidney disease: Secondary | ICD-10-CM | POA: Diagnosis not present

## 2020-02-24 DIAGNOSIS — F0390 Unspecified dementia without behavioral disturbance: Secondary | ICD-10-CM | POA: Diagnosis not present

## 2020-02-26 DIAGNOSIS — D631 Anemia in chronic kidney disease: Secondary | ICD-10-CM | POA: Diagnosis not present

## 2020-02-26 DIAGNOSIS — N183 Chronic kidney disease, stage 3 unspecified: Secondary | ICD-10-CM | POA: Diagnosis not present

## 2020-02-26 DIAGNOSIS — I509 Heart failure, unspecified: Secondary | ICD-10-CM | POA: Diagnosis not present

## 2020-02-26 DIAGNOSIS — I130 Hypertensive heart and chronic kidney disease with heart failure and stage 1 through stage 4 chronic kidney disease, or unspecified chronic kidney disease: Secondary | ICD-10-CM | POA: Diagnosis not present

## 2020-02-26 DIAGNOSIS — F0390 Unspecified dementia without behavioral disturbance: Secondary | ICD-10-CM | POA: Diagnosis not present

## 2020-02-26 DIAGNOSIS — I421 Obstructive hypertrophic cardiomyopathy: Secondary | ICD-10-CM | POA: Diagnosis not present

## 2020-02-27 DIAGNOSIS — D631 Anemia in chronic kidney disease: Secondary | ICD-10-CM | POA: Diagnosis not present

## 2020-02-27 DIAGNOSIS — F0390 Unspecified dementia without behavioral disturbance: Secondary | ICD-10-CM | POA: Diagnosis not present

## 2020-02-27 DIAGNOSIS — N183 Chronic kidney disease, stage 3 unspecified: Secondary | ICD-10-CM | POA: Diagnosis not present

## 2020-02-27 DIAGNOSIS — I130 Hypertensive heart and chronic kidney disease with heart failure and stage 1 through stage 4 chronic kidney disease, or unspecified chronic kidney disease: Secondary | ICD-10-CM | POA: Diagnosis not present

## 2020-02-27 DIAGNOSIS — I509 Heart failure, unspecified: Secondary | ICD-10-CM | POA: Diagnosis not present

## 2020-02-27 DIAGNOSIS — I421 Obstructive hypertrophic cardiomyopathy: Secondary | ICD-10-CM | POA: Diagnosis not present

## 2020-03-02 DIAGNOSIS — I130 Hypertensive heart and chronic kidney disease with heart failure and stage 1 through stage 4 chronic kidney disease, or unspecified chronic kidney disease: Secondary | ICD-10-CM | POA: Diagnosis not present

## 2020-03-02 DIAGNOSIS — N183 Chronic kidney disease, stage 3 unspecified: Secondary | ICD-10-CM | POA: Diagnosis not present

## 2020-03-02 DIAGNOSIS — I509 Heart failure, unspecified: Secondary | ICD-10-CM | POA: Diagnosis not present

## 2020-03-02 DIAGNOSIS — F0390 Unspecified dementia without behavioral disturbance: Secondary | ICD-10-CM | POA: Diagnosis not present

## 2020-03-02 DIAGNOSIS — I421 Obstructive hypertrophic cardiomyopathy: Secondary | ICD-10-CM | POA: Diagnosis not present

## 2020-03-02 DIAGNOSIS — D631 Anemia in chronic kidney disease: Secondary | ICD-10-CM | POA: Diagnosis not present

## 2020-03-04 DIAGNOSIS — R627 Adult failure to thrive: Secondary | ICD-10-CM | POA: Diagnosis not present

## 2020-03-04 DIAGNOSIS — I509 Heart failure, unspecified: Secondary | ICD-10-CM | POA: Diagnosis not present

## 2020-03-04 DIAGNOSIS — S91204D Unspecified open wound of right lesser toe(s) with damage to nail, subsequent encounter: Secondary | ICD-10-CM | POA: Diagnosis not present

## 2020-03-04 DIAGNOSIS — N952 Postmenopausal atrophic vaginitis: Secondary | ICD-10-CM | POA: Diagnosis not present

## 2020-03-04 DIAGNOSIS — S81811D Laceration without foreign body, right lower leg, subsequent encounter: Secondary | ICD-10-CM | POA: Diagnosis not present

## 2020-03-04 DIAGNOSIS — D631 Anemia in chronic kidney disease: Secondary | ICD-10-CM | POA: Diagnosis not present

## 2020-03-04 DIAGNOSIS — Z993 Dependence on wheelchair: Secondary | ICD-10-CM | POA: Diagnosis not present

## 2020-03-04 DIAGNOSIS — Z6822 Body mass index (BMI) 22.0-22.9, adult: Secondary | ICD-10-CM | POA: Diagnosis not present

## 2020-03-04 DIAGNOSIS — I421 Obstructive hypertrophic cardiomyopathy: Secondary | ICD-10-CM | POA: Diagnosis not present

## 2020-03-04 DIAGNOSIS — N183 Chronic kidney disease, stage 3 unspecified: Secondary | ICD-10-CM | POA: Diagnosis not present

## 2020-03-04 DIAGNOSIS — I700 Atherosclerosis of aorta: Secondary | ICD-10-CM | POA: Diagnosis not present

## 2020-03-04 DIAGNOSIS — S81812D Laceration without foreign body, left lower leg, subsequent encounter: Secondary | ICD-10-CM | POA: Diagnosis not present

## 2020-03-04 DIAGNOSIS — R32 Unspecified urinary incontinence: Secondary | ICD-10-CM | POA: Diagnosis not present

## 2020-03-04 DIAGNOSIS — R159 Full incontinence of feces: Secondary | ICD-10-CM | POA: Diagnosis not present

## 2020-03-04 DIAGNOSIS — I130 Hypertensive heart and chronic kidney disease with heart failure and stage 1 through stage 4 chronic kidney disease, or unspecified chronic kidney disease: Secondary | ICD-10-CM | POA: Diagnosis not present

## 2020-03-04 DIAGNOSIS — F0390 Unspecified dementia without behavioral disturbance: Secondary | ICD-10-CM | POA: Diagnosis not present

## 2020-03-04 DIAGNOSIS — Z87440 Personal history of urinary (tract) infections: Secondary | ICD-10-CM | POA: Diagnosis not present

## 2020-03-05 DIAGNOSIS — I509 Heart failure, unspecified: Secondary | ICD-10-CM | POA: Diagnosis not present

## 2020-03-05 DIAGNOSIS — N183 Chronic kidney disease, stage 3 unspecified: Secondary | ICD-10-CM | POA: Diagnosis not present

## 2020-03-05 DIAGNOSIS — I421 Obstructive hypertrophic cardiomyopathy: Secondary | ICD-10-CM | POA: Diagnosis not present

## 2020-03-05 DIAGNOSIS — I130 Hypertensive heart and chronic kidney disease with heart failure and stage 1 through stage 4 chronic kidney disease, or unspecified chronic kidney disease: Secondary | ICD-10-CM | POA: Diagnosis not present

## 2020-03-05 DIAGNOSIS — D631 Anemia in chronic kidney disease: Secondary | ICD-10-CM | POA: Diagnosis not present

## 2020-03-05 DIAGNOSIS — F0390 Unspecified dementia without behavioral disturbance: Secondary | ICD-10-CM | POA: Diagnosis not present

## 2020-03-09 DIAGNOSIS — I421 Obstructive hypertrophic cardiomyopathy: Secondary | ICD-10-CM | POA: Diagnosis not present

## 2020-03-09 DIAGNOSIS — I130 Hypertensive heart and chronic kidney disease with heart failure and stage 1 through stage 4 chronic kidney disease, or unspecified chronic kidney disease: Secondary | ICD-10-CM | POA: Diagnosis not present

## 2020-03-09 DIAGNOSIS — I509 Heart failure, unspecified: Secondary | ICD-10-CM | POA: Diagnosis not present

## 2020-03-09 DIAGNOSIS — D631 Anemia in chronic kidney disease: Secondary | ICD-10-CM | POA: Diagnosis not present

## 2020-03-09 DIAGNOSIS — N183 Chronic kidney disease, stage 3 unspecified: Secondary | ICD-10-CM | POA: Diagnosis not present

## 2020-03-09 DIAGNOSIS — F0390 Unspecified dementia without behavioral disturbance: Secondary | ICD-10-CM | POA: Diagnosis not present

## 2020-03-11 DIAGNOSIS — N183 Chronic kidney disease, stage 3 unspecified: Secondary | ICD-10-CM | POA: Diagnosis not present

## 2020-03-11 DIAGNOSIS — D631 Anemia in chronic kidney disease: Secondary | ICD-10-CM | POA: Diagnosis not present

## 2020-03-11 DIAGNOSIS — F0390 Unspecified dementia without behavioral disturbance: Secondary | ICD-10-CM | POA: Diagnosis not present

## 2020-03-11 DIAGNOSIS — I130 Hypertensive heart and chronic kidney disease with heart failure and stage 1 through stage 4 chronic kidney disease, or unspecified chronic kidney disease: Secondary | ICD-10-CM | POA: Diagnosis not present

## 2020-03-11 DIAGNOSIS — I509 Heart failure, unspecified: Secondary | ICD-10-CM | POA: Diagnosis not present

## 2020-03-11 DIAGNOSIS — I421 Obstructive hypertrophic cardiomyopathy: Secondary | ICD-10-CM | POA: Diagnosis not present

## 2020-03-12 DIAGNOSIS — D631 Anemia in chronic kidney disease: Secondary | ICD-10-CM | POA: Diagnosis not present

## 2020-03-12 DIAGNOSIS — I130 Hypertensive heart and chronic kidney disease with heart failure and stage 1 through stage 4 chronic kidney disease, or unspecified chronic kidney disease: Secondary | ICD-10-CM | POA: Diagnosis not present

## 2020-03-12 DIAGNOSIS — N183 Chronic kidney disease, stage 3 unspecified: Secondary | ICD-10-CM | POA: Diagnosis not present

## 2020-03-12 DIAGNOSIS — I421 Obstructive hypertrophic cardiomyopathy: Secondary | ICD-10-CM | POA: Diagnosis not present

## 2020-03-12 DIAGNOSIS — F0390 Unspecified dementia without behavioral disturbance: Secondary | ICD-10-CM | POA: Diagnosis not present

## 2020-03-12 DIAGNOSIS — I509 Heart failure, unspecified: Secondary | ICD-10-CM | POA: Diagnosis not present

## 2020-03-16 DIAGNOSIS — F0390 Unspecified dementia without behavioral disturbance: Secondary | ICD-10-CM | POA: Diagnosis not present

## 2020-03-16 DIAGNOSIS — I421 Obstructive hypertrophic cardiomyopathy: Secondary | ICD-10-CM | POA: Diagnosis not present

## 2020-03-16 DIAGNOSIS — I509 Heart failure, unspecified: Secondary | ICD-10-CM | POA: Diagnosis not present

## 2020-03-16 DIAGNOSIS — D631 Anemia in chronic kidney disease: Secondary | ICD-10-CM | POA: Diagnosis not present

## 2020-03-16 DIAGNOSIS — N183 Chronic kidney disease, stage 3 unspecified: Secondary | ICD-10-CM | POA: Diagnosis not present

## 2020-03-16 DIAGNOSIS — I130 Hypertensive heart and chronic kidney disease with heart failure and stage 1 through stage 4 chronic kidney disease, or unspecified chronic kidney disease: Secondary | ICD-10-CM | POA: Diagnosis not present

## 2020-03-18 DIAGNOSIS — F0390 Unspecified dementia without behavioral disturbance: Secondary | ICD-10-CM | POA: Diagnosis not present

## 2020-03-18 DIAGNOSIS — I509 Heart failure, unspecified: Secondary | ICD-10-CM | POA: Diagnosis not present

## 2020-03-18 DIAGNOSIS — N183 Chronic kidney disease, stage 3 unspecified: Secondary | ICD-10-CM | POA: Diagnosis not present

## 2020-03-18 DIAGNOSIS — D631 Anemia in chronic kidney disease: Secondary | ICD-10-CM | POA: Diagnosis not present

## 2020-03-18 DIAGNOSIS — I130 Hypertensive heart and chronic kidney disease with heart failure and stage 1 through stage 4 chronic kidney disease, or unspecified chronic kidney disease: Secondary | ICD-10-CM | POA: Diagnosis not present

## 2020-03-18 DIAGNOSIS — I421 Obstructive hypertrophic cardiomyopathy: Secondary | ICD-10-CM | POA: Diagnosis not present

## 2020-03-19 DIAGNOSIS — I509 Heart failure, unspecified: Secondary | ICD-10-CM | POA: Diagnosis not present

## 2020-03-19 DIAGNOSIS — D631 Anemia in chronic kidney disease: Secondary | ICD-10-CM | POA: Diagnosis not present

## 2020-03-19 DIAGNOSIS — I130 Hypertensive heart and chronic kidney disease with heart failure and stage 1 through stage 4 chronic kidney disease, or unspecified chronic kidney disease: Secondary | ICD-10-CM | POA: Diagnosis not present

## 2020-03-19 DIAGNOSIS — F0390 Unspecified dementia without behavioral disturbance: Secondary | ICD-10-CM | POA: Diagnosis not present

## 2020-03-19 DIAGNOSIS — N183 Chronic kidney disease, stage 3 unspecified: Secondary | ICD-10-CM | POA: Diagnosis not present

## 2020-03-19 DIAGNOSIS — I421 Obstructive hypertrophic cardiomyopathy: Secondary | ICD-10-CM | POA: Diagnosis not present

## 2020-03-23 DIAGNOSIS — I509 Heart failure, unspecified: Secondary | ICD-10-CM | POA: Diagnosis not present

## 2020-03-23 DIAGNOSIS — I130 Hypertensive heart and chronic kidney disease with heart failure and stage 1 through stage 4 chronic kidney disease, or unspecified chronic kidney disease: Secondary | ICD-10-CM | POA: Diagnosis not present

## 2020-03-23 DIAGNOSIS — D631 Anemia in chronic kidney disease: Secondary | ICD-10-CM | POA: Diagnosis not present

## 2020-03-23 DIAGNOSIS — I421 Obstructive hypertrophic cardiomyopathy: Secondary | ICD-10-CM | POA: Diagnosis not present

## 2020-03-23 DIAGNOSIS — N183 Chronic kidney disease, stage 3 unspecified: Secondary | ICD-10-CM | POA: Diagnosis not present

## 2020-03-23 DIAGNOSIS — F0390 Unspecified dementia without behavioral disturbance: Secondary | ICD-10-CM | POA: Diagnosis not present

## 2020-03-25 DIAGNOSIS — D631 Anemia in chronic kidney disease: Secondary | ICD-10-CM | POA: Diagnosis not present

## 2020-03-25 DIAGNOSIS — F0390 Unspecified dementia without behavioral disturbance: Secondary | ICD-10-CM | POA: Diagnosis not present

## 2020-03-25 DIAGNOSIS — I509 Heart failure, unspecified: Secondary | ICD-10-CM | POA: Diagnosis not present

## 2020-03-25 DIAGNOSIS — I130 Hypertensive heart and chronic kidney disease with heart failure and stage 1 through stage 4 chronic kidney disease, or unspecified chronic kidney disease: Secondary | ICD-10-CM | POA: Diagnosis not present

## 2020-03-25 DIAGNOSIS — I421 Obstructive hypertrophic cardiomyopathy: Secondary | ICD-10-CM | POA: Diagnosis not present

## 2020-03-25 DIAGNOSIS — N183 Chronic kidney disease, stage 3 unspecified: Secondary | ICD-10-CM | POA: Diagnosis not present

## 2020-03-26 DIAGNOSIS — I421 Obstructive hypertrophic cardiomyopathy: Secondary | ICD-10-CM | POA: Diagnosis not present

## 2020-03-26 DIAGNOSIS — D631 Anemia in chronic kidney disease: Secondary | ICD-10-CM | POA: Diagnosis not present

## 2020-03-26 DIAGNOSIS — R609 Edema, unspecified: Secondary | ICD-10-CM | POA: Diagnosis not present

## 2020-03-26 DIAGNOSIS — I10 Essential (primary) hypertension: Secondary | ICD-10-CM | POA: Diagnosis not present

## 2020-03-26 DIAGNOSIS — I509 Heart failure, unspecified: Secondary | ICD-10-CM | POA: Diagnosis not present

## 2020-03-26 DIAGNOSIS — N183 Chronic kidney disease, stage 3 unspecified: Secondary | ICD-10-CM | POA: Diagnosis not present

## 2020-03-26 DIAGNOSIS — F0390 Unspecified dementia without behavioral disturbance: Secondary | ICD-10-CM | POA: Diagnosis not present

## 2020-03-26 DIAGNOSIS — I130 Hypertensive heart and chronic kidney disease with heart failure and stage 1 through stage 4 chronic kidney disease, or unspecified chronic kidney disease: Secondary | ICD-10-CM | POA: Diagnosis not present

## 2020-03-30 DIAGNOSIS — I509 Heart failure, unspecified: Secondary | ICD-10-CM | POA: Diagnosis not present

## 2020-03-30 DIAGNOSIS — D631 Anemia in chronic kidney disease: Secondary | ICD-10-CM | POA: Diagnosis not present

## 2020-03-30 DIAGNOSIS — I130 Hypertensive heart and chronic kidney disease with heart failure and stage 1 through stage 4 chronic kidney disease, or unspecified chronic kidney disease: Secondary | ICD-10-CM | POA: Diagnosis not present

## 2020-03-30 DIAGNOSIS — N183 Chronic kidney disease, stage 3 unspecified: Secondary | ICD-10-CM | POA: Diagnosis not present

## 2020-03-30 DIAGNOSIS — I421 Obstructive hypertrophic cardiomyopathy: Secondary | ICD-10-CM | POA: Diagnosis not present

## 2020-03-30 DIAGNOSIS — F0390 Unspecified dementia without behavioral disturbance: Secondary | ICD-10-CM | POA: Diagnosis not present

## 2020-04-01 DIAGNOSIS — D631 Anemia in chronic kidney disease: Secondary | ICD-10-CM | POA: Diagnosis not present

## 2020-04-01 DIAGNOSIS — F0390 Unspecified dementia without behavioral disturbance: Secondary | ICD-10-CM | POA: Diagnosis not present

## 2020-04-01 DIAGNOSIS — I509 Heart failure, unspecified: Secondary | ICD-10-CM | POA: Diagnosis not present

## 2020-04-01 DIAGNOSIS — I421 Obstructive hypertrophic cardiomyopathy: Secondary | ICD-10-CM | POA: Diagnosis not present

## 2020-04-01 DIAGNOSIS — N183 Chronic kidney disease, stage 3 unspecified: Secondary | ICD-10-CM | POA: Diagnosis not present

## 2020-04-01 DIAGNOSIS — I130 Hypertensive heart and chronic kidney disease with heart failure and stage 1 through stage 4 chronic kidney disease, or unspecified chronic kidney disease: Secondary | ICD-10-CM | POA: Diagnosis not present

## 2020-04-02 DIAGNOSIS — I421 Obstructive hypertrophic cardiomyopathy: Secondary | ICD-10-CM | POA: Diagnosis not present

## 2020-04-02 DIAGNOSIS — I509 Heart failure, unspecified: Secondary | ICD-10-CM | POA: Diagnosis not present

## 2020-04-02 DIAGNOSIS — I130 Hypertensive heart and chronic kidney disease with heart failure and stage 1 through stage 4 chronic kidney disease, or unspecified chronic kidney disease: Secondary | ICD-10-CM | POA: Diagnosis not present

## 2020-04-02 DIAGNOSIS — F0390 Unspecified dementia without behavioral disturbance: Secondary | ICD-10-CM | POA: Diagnosis not present

## 2020-04-02 DIAGNOSIS — N183 Chronic kidney disease, stage 3 unspecified: Secondary | ICD-10-CM | POA: Diagnosis not present

## 2020-04-02 DIAGNOSIS — D631 Anemia in chronic kidney disease: Secondary | ICD-10-CM | POA: Diagnosis not present

## 2020-04-04 DIAGNOSIS — S91204D Unspecified open wound of right lesser toe(s) with damage to nail, subsequent encounter: Secondary | ICD-10-CM | POA: Diagnosis not present

## 2020-04-04 DIAGNOSIS — I421 Obstructive hypertrophic cardiomyopathy: Secondary | ICD-10-CM | POA: Diagnosis not present

## 2020-04-04 DIAGNOSIS — R159 Full incontinence of feces: Secondary | ICD-10-CM | POA: Diagnosis not present

## 2020-04-04 DIAGNOSIS — D631 Anemia in chronic kidney disease: Secondary | ICD-10-CM | POA: Diagnosis not present

## 2020-04-04 DIAGNOSIS — I700 Atherosclerosis of aorta: Secondary | ICD-10-CM | POA: Diagnosis not present

## 2020-04-04 DIAGNOSIS — R32 Unspecified urinary incontinence: Secondary | ICD-10-CM | POA: Diagnosis not present

## 2020-04-04 DIAGNOSIS — F0390 Unspecified dementia without behavioral disturbance: Secondary | ICD-10-CM | POA: Diagnosis not present

## 2020-04-04 DIAGNOSIS — S81812D Laceration without foreign body, left lower leg, subsequent encounter: Secondary | ICD-10-CM | POA: Diagnosis not present

## 2020-04-04 DIAGNOSIS — Z87440 Personal history of urinary (tract) infections: Secondary | ICD-10-CM | POA: Diagnosis not present

## 2020-04-04 DIAGNOSIS — Z993 Dependence on wheelchair: Secondary | ICD-10-CM | POA: Diagnosis not present

## 2020-04-04 DIAGNOSIS — I130 Hypertensive heart and chronic kidney disease with heart failure and stage 1 through stage 4 chronic kidney disease, or unspecified chronic kidney disease: Secondary | ICD-10-CM | POA: Diagnosis not present

## 2020-04-04 DIAGNOSIS — N952 Postmenopausal atrophic vaginitis: Secondary | ICD-10-CM | POA: Diagnosis not present

## 2020-04-04 DIAGNOSIS — I509 Heart failure, unspecified: Secondary | ICD-10-CM | POA: Diagnosis not present

## 2020-04-04 DIAGNOSIS — Z6822 Body mass index (BMI) 22.0-22.9, adult: Secondary | ICD-10-CM | POA: Diagnosis not present

## 2020-04-04 DIAGNOSIS — S81811D Laceration without foreign body, right lower leg, subsequent encounter: Secondary | ICD-10-CM | POA: Diagnosis not present

## 2020-04-04 DIAGNOSIS — R627 Adult failure to thrive: Secondary | ICD-10-CM | POA: Diagnosis not present

## 2020-04-04 DIAGNOSIS — N183 Chronic kidney disease, stage 3 unspecified: Secondary | ICD-10-CM | POA: Diagnosis not present

## 2020-04-06 DIAGNOSIS — F0390 Unspecified dementia without behavioral disturbance: Secondary | ICD-10-CM | POA: Diagnosis not present

## 2020-04-06 DIAGNOSIS — N183 Chronic kidney disease, stage 3 unspecified: Secondary | ICD-10-CM | POA: Diagnosis not present

## 2020-04-06 DIAGNOSIS — D631 Anemia in chronic kidney disease: Secondary | ICD-10-CM | POA: Diagnosis not present

## 2020-04-06 DIAGNOSIS — I509 Heart failure, unspecified: Secondary | ICD-10-CM | POA: Diagnosis not present

## 2020-04-06 DIAGNOSIS — I130 Hypertensive heart and chronic kidney disease with heart failure and stage 1 through stage 4 chronic kidney disease, or unspecified chronic kidney disease: Secondary | ICD-10-CM | POA: Diagnosis not present

## 2020-04-06 DIAGNOSIS — I421 Obstructive hypertrophic cardiomyopathy: Secondary | ICD-10-CM | POA: Diagnosis not present

## 2020-04-07 DIAGNOSIS — F0390 Unspecified dementia without behavioral disturbance: Secondary | ICD-10-CM | POA: Diagnosis not present

## 2020-04-07 DIAGNOSIS — D631 Anemia in chronic kidney disease: Secondary | ICD-10-CM | POA: Diagnosis not present

## 2020-04-07 DIAGNOSIS — I130 Hypertensive heart and chronic kidney disease with heart failure and stage 1 through stage 4 chronic kidney disease, or unspecified chronic kidney disease: Secondary | ICD-10-CM | POA: Diagnosis not present

## 2020-04-07 DIAGNOSIS — N183 Chronic kidney disease, stage 3 unspecified: Secondary | ICD-10-CM | POA: Diagnosis not present

## 2020-04-07 DIAGNOSIS — I509 Heart failure, unspecified: Secondary | ICD-10-CM | POA: Diagnosis not present

## 2020-04-07 DIAGNOSIS — I421 Obstructive hypertrophic cardiomyopathy: Secondary | ICD-10-CM | POA: Diagnosis not present

## 2020-04-08 DIAGNOSIS — D631 Anemia in chronic kidney disease: Secondary | ICD-10-CM | POA: Diagnosis not present

## 2020-04-08 DIAGNOSIS — I421 Obstructive hypertrophic cardiomyopathy: Secondary | ICD-10-CM | POA: Diagnosis not present

## 2020-04-08 DIAGNOSIS — I130 Hypertensive heart and chronic kidney disease with heart failure and stage 1 through stage 4 chronic kidney disease, or unspecified chronic kidney disease: Secondary | ICD-10-CM | POA: Diagnosis not present

## 2020-04-08 DIAGNOSIS — I509 Heart failure, unspecified: Secondary | ICD-10-CM | POA: Diagnosis not present

## 2020-04-08 DIAGNOSIS — N183 Chronic kidney disease, stage 3 unspecified: Secondary | ICD-10-CM | POA: Diagnosis not present

## 2020-04-08 DIAGNOSIS — F0390 Unspecified dementia without behavioral disturbance: Secondary | ICD-10-CM | POA: Diagnosis not present

## 2020-04-09 DIAGNOSIS — D631 Anemia in chronic kidney disease: Secondary | ICD-10-CM | POA: Diagnosis not present

## 2020-04-09 DIAGNOSIS — I421 Obstructive hypertrophic cardiomyopathy: Secondary | ICD-10-CM | POA: Diagnosis not present

## 2020-04-09 DIAGNOSIS — I130 Hypertensive heart and chronic kidney disease with heart failure and stage 1 through stage 4 chronic kidney disease, or unspecified chronic kidney disease: Secondary | ICD-10-CM | POA: Diagnosis not present

## 2020-04-09 DIAGNOSIS — F0390 Unspecified dementia without behavioral disturbance: Secondary | ICD-10-CM | POA: Diagnosis not present

## 2020-04-09 DIAGNOSIS — N183 Chronic kidney disease, stage 3 unspecified: Secondary | ICD-10-CM | POA: Diagnosis not present

## 2020-04-09 DIAGNOSIS — I509 Heart failure, unspecified: Secondary | ICD-10-CM | POA: Diagnosis not present

## 2020-04-13 DIAGNOSIS — N183 Chronic kidney disease, stage 3 unspecified: Secondary | ICD-10-CM | POA: Diagnosis not present

## 2020-04-13 DIAGNOSIS — F0390 Unspecified dementia without behavioral disturbance: Secondary | ICD-10-CM | POA: Diagnosis not present

## 2020-04-13 DIAGNOSIS — I130 Hypertensive heart and chronic kidney disease with heart failure and stage 1 through stage 4 chronic kidney disease, or unspecified chronic kidney disease: Secondary | ICD-10-CM | POA: Diagnosis not present

## 2020-04-13 DIAGNOSIS — I509 Heart failure, unspecified: Secondary | ICD-10-CM | POA: Diagnosis not present

## 2020-04-13 DIAGNOSIS — I421 Obstructive hypertrophic cardiomyopathy: Secondary | ICD-10-CM | POA: Diagnosis not present

## 2020-04-13 DIAGNOSIS — D631 Anemia in chronic kidney disease: Secondary | ICD-10-CM | POA: Diagnosis not present

## 2020-04-15 DIAGNOSIS — I509 Heart failure, unspecified: Secondary | ICD-10-CM | POA: Diagnosis not present

## 2020-04-15 DIAGNOSIS — I130 Hypertensive heart and chronic kidney disease with heart failure and stage 1 through stage 4 chronic kidney disease, or unspecified chronic kidney disease: Secondary | ICD-10-CM | POA: Diagnosis not present

## 2020-04-15 DIAGNOSIS — I421 Obstructive hypertrophic cardiomyopathy: Secondary | ICD-10-CM | POA: Diagnosis not present

## 2020-04-15 DIAGNOSIS — N183 Chronic kidney disease, stage 3 unspecified: Secondary | ICD-10-CM | POA: Diagnosis not present

## 2020-04-15 DIAGNOSIS — D631 Anemia in chronic kidney disease: Secondary | ICD-10-CM | POA: Diagnosis not present

## 2020-04-15 DIAGNOSIS — F0390 Unspecified dementia without behavioral disturbance: Secondary | ICD-10-CM | POA: Diagnosis not present

## 2020-04-16 DIAGNOSIS — I421 Obstructive hypertrophic cardiomyopathy: Secondary | ICD-10-CM | POA: Diagnosis not present

## 2020-04-16 DIAGNOSIS — F0390 Unspecified dementia without behavioral disturbance: Secondary | ICD-10-CM | POA: Diagnosis not present

## 2020-04-16 DIAGNOSIS — I509 Heart failure, unspecified: Secondary | ICD-10-CM | POA: Diagnosis not present

## 2020-04-16 DIAGNOSIS — D631 Anemia in chronic kidney disease: Secondary | ICD-10-CM | POA: Diagnosis not present

## 2020-04-16 DIAGNOSIS — N183 Chronic kidney disease, stage 3 unspecified: Secondary | ICD-10-CM | POA: Diagnosis not present

## 2020-04-16 DIAGNOSIS — I130 Hypertensive heart and chronic kidney disease with heart failure and stage 1 through stage 4 chronic kidney disease, or unspecified chronic kidney disease: Secondary | ICD-10-CM | POA: Diagnosis not present

## 2020-04-20 DIAGNOSIS — I130 Hypertensive heart and chronic kidney disease with heart failure and stage 1 through stage 4 chronic kidney disease, or unspecified chronic kidney disease: Secondary | ICD-10-CM | POA: Diagnosis not present

## 2020-04-20 DIAGNOSIS — I421 Obstructive hypertrophic cardiomyopathy: Secondary | ICD-10-CM | POA: Diagnosis not present

## 2020-04-20 DIAGNOSIS — F0390 Unspecified dementia without behavioral disturbance: Secondary | ICD-10-CM | POA: Diagnosis not present

## 2020-04-20 DIAGNOSIS — N183 Chronic kidney disease, stage 3 unspecified: Secondary | ICD-10-CM | POA: Diagnosis not present

## 2020-04-20 DIAGNOSIS — I509 Heart failure, unspecified: Secondary | ICD-10-CM | POA: Diagnosis not present

## 2020-04-20 DIAGNOSIS — D631 Anemia in chronic kidney disease: Secondary | ICD-10-CM | POA: Diagnosis not present

## 2020-04-22 DIAGNOSIS — I509 Heart failure, unspecified: Secondary | ICD-10-CM | POA: Diagnosis not present

## 2020-04-22 DIAGNOSIS — I421 Obstructive hypertrophic cardiomyopathy: Secondary | ICD-10-CM | POA: Diagnosis not present

## 2020-04-22 DIAGNOSIS — D631 Anemia in chronic kidney disease: Secondary | ICD-10-CM | POA: Diagnosis not present

## 2020-04-22 DIAGNOSIS — F0390 Unspecified dementia without behavioral disturbance: Secondary | ICD-10-CM | POA: Diagnosis not present

## 2020-04-22 DIAGNOSIS — N183 Chronic kidney disease, stage 3 unspecified: Secondary | ICD-10-CM | POA: Diagnosis not present

## 2020-04-22 DIAGNOSIS — I130 Hypertensive heart and chronic kidney disease with heart failure and stage 1 through stage 4 chronic kidney disease, or unspecified chronic kidney disease: Secondary | ICD-10-CM | POA: Diagnosis not present

## 2020-04-23 DIAGNOSIS — F0390 Unspecified dementia without behavioral disturbance: Secondary | ICD-10-CM | POA: Diagnosis not present

## 2020-04-23 DIAGNOSIS — I509 Heart failure, unspecified: Secondary | ICD-10-CM | POA: Diagnosis not present

## 2020-04-23 DIAGNOSIS — D631 Anemia in chronic kidney disease: Secondary | ICD-10-CM | POA: Diagnosis not present

## 2020-04-23 DIAGNOSIS — I421 Obstructive hypertrophic cardiomyopathy: Secondary | ICD-10-CM | POA: Diagnosis not present

## 2020-04-23 DIAGNOSIS — N183 Chronic kidney disease, stage 3 unspecified: Secondary | ICD-10-CM | POA: Diagnosis not present

## 2020-04-23 DIAGNOSIS — I130 Hypertensive heart and chronic kidney disease with heart failure and stage 1 through stage 4 chronic kidney disease, or unspecified chronic kidney disease: Secondary | ICD-10-CM | POA: Diagnosis not present

## 2020-04-27 DIAGNOSIS — N183 Chronic kidney disease, stage 3 unspecified: Secondary | ICD-10-CM | POA: Diagnosis not present

## 2020-04-27 DIAGNOSIS — I130 Hypertensive heart and chronic kidney disease with heart failure and stage 1 through stage 4 chronic kidney disease, or unspecified chronic kidney disease: Secondary | ICD-10-CM | POA: Diagnosis not present

## 2020-04-27 DIAGNOSIS — D631 Anemia in chronic kidney disease: Secondary | ICD-10-CM | POA: Diagnosis not present

## 2020-04-27 DIAGNOSIS — I509 Heart failure, unspecified: Secondary | ICD-10-CM | POA: Diagnosis not present

## 2020-04-27 DIAGNOSIS — I421 Obstructive hypertrophic cardiomyopathy: Secondary | ICD-10-CM | POA: Diagnosis not present

## 2020-04-27 DIAGNOSIS — F0390 Unspecified dementia without behavioral disturbance: Secondary | ICD-10-CM | POA: Diagnosis not present

## 2020-04-29 DIAGNOSIS — I509 Heart failure, unspecified: Secondary | ICD-10-CM | POA: Diagnosis not present

## 2020-04-29 DIAGNOSIS — F0390 Unspecified dementia without behavioral disturbance: Secondary | ICD-10-CM | POA: Diagnosis not present

## 2020-04-29 DIAGNOSIS — N183 Chronic kidney disease, stage 3 unspecified: Secondary | ICD-10-CM | POA: Diagnosis not present

## 2020-04-29 DIAGNOSIS — I130 Hypertensive heart and chronic kidney disease with heart failure and stage 1 through stage 4 chronic kidney disease, or unspecified chronic kidney disease: Secondary | ICD-10-CM | POA: Diagnosis not present

## 2020-04-29 DIAGNOSIS — I421 Obstructive hypertrophic cardiomyopathy: Secondary | ICD-10-CM | POA: Diagnosis not present

## 2020-04-29 DIAGNOSIS — D631 Anemia in chronic kidney disease: Secondary | ICD-10-CM | POA: Diagnosis not present

## 2020-04-30 DIAGNOSIS — F0390 Unspecified dementia without behavioral disturbance: Secondary | ICD-10-CM | POA: Diagnosis not present

## 2020-04-30 DIAGNOSIS — I509 Heart failure, unspecified: Secondary | ICD-10-CM | POA: Diagnosis not present

## 2020-04-30 DIAGNOSIS — I130 Hypertensive heart and chronic kidney disease with heart failure and stage 1 through stage 4 chronic kidney disease, or unspecified chronic kidney disease: Secondary | ICD-10-CM | POA: Diagnosis not present

## 2020-04-30 DIAGNOSIS — D631 Anemia in chronic kidney disease: Secondary | ICD-10-CM | POA: Diagnosis not present

## 2020-04-30 DIAGNOSIS — I421 Obstructive hypertrophic cardiomyopathy: Secondary | ICD-10-CM | POA: Diagnosis not present

## 2020-04-30 DIAGNOSIS — N183 Chronic kidney disease, stage 3 unspecified: Secondary | ICD-10-CM | POA: Diagnosis not present

## 2020-05-03 DIAGNOSIS — N390 Urinary tract infection, site not specified: Secondary | ICD-10-CM | POA: Diagnosis not present

## 2020-05-04 DIAGNOSIS — I130 Hypertensive heart and chronic kidney disease with heart failure and stage 1 through stage 4 chronic kidney disease, or unspecified chronic kidney disease: Secondary | ICD-10-CM | POA: Diagnosis not present

## 2020-05-04 DIAGNOSIS — I509 Heart failure, unspecified: Secondary | ICD-10-CM | POA: Diagnosis not present

## 2020-05-04 DIAGNOSIS — N183 Chronic kidney disease, stage 3 unspecified: Secondary | ICD-10-CM | POA: Diagnosis not present

## 2020-05-04 DIAGNOSIS — D631 Anemia in chronic kidney disease: Secondary | ICD-10-CM | POA: Diagnosis not present

## 2020-05-04 DIAGNOSIS — I421 Obstructive hypertrophic cardiomyopathy: Secondary | ICD-10-CM | POA: Diagnosis not present

## 2020-05-04 DIAGNOSIS — F0390 Unspecified dementia without behavioral disturbance: Secondary | ICD-10-CM | POA: Diagnosis not present

## 2020-05-05 DIAGNOSIS — I421 Obstructive hypertrophic cardiomyopathy: Secondary | ICD-10-CM | POA: Diagnosis not present

## 2020-05-05 DIAGNOSIS — R32 Unspecified urinary incontinence: Secondary | ICD-10-CM | POA: Diagnosis not present

## 2020-05-05 DIAGNOSIS — I509 Heart failure, unspecified: Secondary | ICD-10-CM | POA: Diagnosis not present

## 2020-05-05 DIAGNOSIS — Z993 Dependence on wheelchair: Secondary | ICD-10-CM | POA: Diagnosis not present

## 2020-05-05 DIAGNOSIS — N183 Chronic kidney disease, stage 3 unspecified: Secondary | ICD-10-CM | POA: Diagnosis not present

## 2020-05-05 DIAGNOSIS — Z87440 Personal history of urinary (tract) infections: Secondary | ICD-10-CM | POA: Diagnosis not present

## 2020-05-05 DIAGNOSIS — R159 Full incontinence of feces: Secondary | ICD-10-CM | POA: Diagnosis not present

## 2020-05-05 DIAGNOSIS — I130 Hypertensive heart and chronic kidney disease with heart failure and stage 1 through stage 4 chronic kidney disease, or unspecified chronic kidney disease: Secondary | ICD-10-CM | POA: Diagnosis not present

## 2020-05-05 DIAGNOSIS — D631 Anemia in chronic kidney disease: Secondary | ICD-10-CM | POA: Diagnosis not present

## 2020-05-05 DIAGNOSIS — I700 Atherosclerosis of aorta: Secondary | ICD-10-CM | POA: Diagnosis not present

## 2020-05-05 DIAGNOSIS — F0390 Unspecified dementia without behavioral disturbance: Secondary | ICD-10-CM | POA: Diagnosis not present

## 2020-05-05 DIAGNOSIS — N952 Postmenopausal atrophic vaginitis: Secondary | ICD-10-CM | POA: Diagnosis not present

## 2020-05-05 DIAGNOSIS — Z6822 Body mass index (BMI) 22.0-22.9, adult: Secondary | ICD-10-CM | POA: Diagnosis not present

## 2020-05-05 DIAGNOSIS — R627 Adult failure to thrive: Secondary | ICD-10-CM | POA: Diagnosis not present

## 2020-05-06 DIAGNOSIS — I509 Heart failure, unspecified: Secondary | ICD-10-CM | POA: Diagnosis not present

## 2020-05-06 DIAGNOSIS — N390 Urinary tract infection, site not specified: Secondary | ICD-10-CM | POA: Diagnosis not present

## 2020-05-06 DIAGNOSIS — I10 Essential (primary) hypertension: Secondary | ICD-10-CM | POA: Diagnosis not present

## 2020-05-06 DIAGNOSIS — F0390 Unspecified dementia without behavioral disturbance: Secondary | ICD-10-CM | POA: Diagnosis not present

## 2020-05-08 DIAGNOSIS — I509 Heart failure, unspecified: Secondary | ICD-10-CM | POA: Diagnosis not present

## 2020-05-08 DIAGNOSIS — I421 Obstructive hypertrophic cardiomyopathy: Secondary | ICD-10-CM | POA: Diagnosis not present

## 2020-05-08 DIAGNOSIS — D631 Anemia in chronic kidney disease: Secondary | ICD-10-CM | POA: Diagnosis not present

## 2020-05-08 DIAGNOSIS — N183 Chronic kidney disease, stage 3 unspecified: Secondary | ICD-10-CM | POA: Diagnosis not present

## 2020-05-08 DIAGNOSIS — F0390 Unspecified dementia without behavioral disturbance: Secondary | ICD-10-CM | POA: Diagnosis not present

## 2020-05-08 DIAGNOSIS — I130 Hypertensive heart and chronic kidney disease with heart failure and stage 1 through stage 4 chronic kidney disease, or unspecified chronic kidney disease: Secondary | ICD-10-CM | POA: Diagnosis not present

## 2020-05-11 DIAGNOSIS — N183 Chronic kidney disease, stage 3 unspecified: Secondary | ICD-10-CM | POA: Diagnosis not present

## 2020-05-11 DIAGNOSIS — F0390 Unspecified dementia without behavioral disturbance: Secondary | ICD-10-CM | POA: Diagnosis not present

## 2020-05-11 DIAGNOSIS — I421 Obstructive hypertrophic cardiomyopathy: Secondary | ICD-10-CM | POA: Diagnosis not present

## 2020-05-11 DIAGNOSIS — D631 Anemia in chronic kidney disease: Secondary | ICD-10-CM | POA: Diagnosis not present

## 2020-05-11 DIAGNOSIS — I509 Heart failure, unspecified: Secondary | ICD-10-CM | POA: Diagnosis not present

## 2020-05-11 DIAGNOSIS — I130 Hypertensive heart and chronic kidney disease with heart failure and stage 1 through stage 4 chronic kidney disease, or unspecified chronic kidney disease: Secondary | ICD-10-CM | POA: Diagnosis not present

## 2020-05-12 DIAGNOSIS — N183 Chronic kidney disease, stage 3 unspecified: Secondary | ICD-10-CM | POA: Diagnosis not present

## 2020-05-12 DIAGNOSIS — F0390 Unspecified dementia without behavioral disturbance: Secondary | ICD-10-CM | POA: Diagnosis not present

## 2020-05-12 DIAGNOSIS — I130 Hypertensive heart and chronic kidney disease with heart failure and stage 1 through stage 4 chronic kidney disease, or unspecified chronic kidney disease: Secondary | ICD-10-CM | POA: Diagnosis not present

## 2020-05-12 DIAGNOSIS — I509 Heart failure, unspecified: Secondary | ICD-10-CM | POA: Diagnosis not present

## 2020-05-12 DIAGNOSIS — D631 Anemia in chronic kidney disease: Secondary | ICD-10-CM | POA: Diagnosis not present

## 2020-05-12 DIAGNOSIS — I421 Obstructive hypertrophic cardiomyopathy: Secondary | ICD-10-CM | POA: Diagnosis not present

## 2020-05-13 DIAGNOSIS — I130 Hypertensive heart and chronic kidney disease with heart failure and stage 1 through stage 4 chronic kidney disease, or unspecified chronic kidney disease: Secondary | ICD-10-CM | POA: Diagnosis not present

## 2020-05-13 DIAGNOSIS — I509 Heart failure, unspecified: Secondary | ICD-10-CM | POA: Diagnosis not present

## 2020-05-13 DIAGNOSIS — N183 Chronic kidney disease, stage 3 unspecified: Secondary | ICD-10-CM | POA: Diagnosis not present

## 2020-05-13 DIAGNOSIS — F0390 Unspecified dementia without behavioral disturbance: Secondary | ICD-10-CM | POA: Diagnosis not present

## 2020-05-13 DIAGNOSIS — I421 Obstructive hypertrophic cardiomyopathy: Secondary | ICD-10-CM | POA: Diagnosis not present

## 2020-05-13 DIAGNOSIS — D631 Anemia in chronic kidney disease: Secondary | ICD-10-CM | POA: Diagnosis not present

## 2020-05-18 DIAGNOSIS — D631 Anemia in chronic kidney disease: Secondary | ICD-10-CM | POA: Diagnosis not present

## 2020-05-18 DIAGNOSIS — F0390 Unspecified dementia without behavioral disturbance: Secondary | ICD-10-CM | POA: Diagnosis not present

## 2020-05-18 DIAGNOSIS — I130 Hypertensive heart and chronic kidney disease with heart failure and stage 1 through stage 4 chronic kidney disease, or unspecified chronic kidney disease: Secondary | ICD-10-CM | POA: Diagnosis not present

## 2020-05-18 DIAGNOSIS — I509 Heart failure, unspecified: Secondary | ICD-10-CM | POA: Diagnosis not present

## 2020-05-18 DIAGNOSIS — I421 Obstructive hypertrophic cardiomyopathy: Secondary | ICD-10-CM | POA: Diagnosis not present

## 2020-05-18 DIAGNOSIS — N183 Chronic kidney disease, stage 3 unspecified: Secondary | ICD-10-CM | POA: Diagnosis not present

## 2020-05-20 DIAGNOSIS — N183 Chronic kidney disease, stage 3 unspecified: Secondary | ICD-10-CM | POA: Diagnosis not present

## 2020-05-20 DIAGNOSIS — I421 Obstructive hypertrophic cardiomyopathy: Secondary | ICD-10-CM | POA: Diagnosis not present

## 2020-05-20 DIAGNOSIS — I130 Hypertensive heart and chronic kidney disease with heart failure and stage 1 through stage 4 chronic kidney disease, or unspecified chronic kidney disease: Secondary | ICD-10-CM | POA: Diagnosis not present

## 2020-05-20 DIAGNOSIS — D631 Anemia in chronic kidney disease: Secondary | ICD-10-CM | POA: Diagnosis not present

## 2020-05-20 DIAGNOSIS — F0390 Unspecified dementia without behavioral disturbance: Secondary | ICD-10-CM | POA: Diagnosis not present

## 2020-05-20 DIAGNOSIS — I509 Heart failure, unspecified: Secondary | ICD-10-CM | POA: Diagnosis not present

## 2020-05-25 DIAGNOSIS — I509 Heart failure, unspecified: Secondary | ICD-10-CM | POA: Diagnosis not present

## 2020-05-25 DIAGNOSIS — D631 Anemia in chronic kidney disease: Secondary | ICD-10-CM | POA: Diagnosis not present

## 2020-05-25 DIAGNOSIS — I421 Obstructive hypertrophic cardiomyopathy: Secondary | ICD-10-CM | POA: Diagnosis not present

## 2020-05-25 DIAGNOSIS — I130 Hypertensive heart and chronic kidney disease with heart failure and stage 1 through stage 4 chronic kidney disease, or unspecified chronic kidney disease: Secondary | ICD-10-CM | POA: Diagnosis not present

## 2020-05-25 DIAGNOSIS — N183 Chronic kidney disease, stage 3 unspecified: Secondary | ICD-10-CM | POA: Diagnosis not present

## 2020-05-25 DIAGNOSIS — F0390 Unspecified dementia without behavioral disturbance: Secondary | ICD-10-CM | POA: Diagnosis not present

## 2020-05-26 DIAGNOSIS — I10 Essential (primary) hypertension: Secondary | ICD-10-CM | POA: Diagnosis not present

## 2020-05-26 DIAGNOSIS — I509 Heart failure, unspecified: Secondary | ICD-10-CM | POA: Diagnosis not present

## 2020-05-26 DIAGNOSIS — I130 Hypertensive heart and chronic kidney disease with heart failure and stage 1 through stage 4 chronic kidney disease, or unspecified chronic kidney disease: Secondary | ICD-10-CM | POA: Diagnosis not present

## 2020-05-26 DIAGNOSIS — I421 Obstructive hypertrophic cardiomyopathy: Secondary | ICD-10-CM | POA: Diagnosis not present

## 2020-05-26 DIAGNOSIS — N183 Chronic kidney disease, stage 3 unspecified: Secondary | ICD-10-CM | POA: Diagnosis not present

## 2020-05-26 DIAGNOSIS — D631 Anemia in chronic kidney disease: Secondary | ICD-10-CM | POA: Diagnosis not present

## 2020-05-26 DIAGNOSIS — F0390 Unspecified dementia without behavioral disturbance: Secondary | ICD-10-CM | POA: Diagnosis not present

## 2020-05-27 DIAGNOSIS — N183 Chronic kidney disease, stage 3 unspecified: Secondary | ICD-10-CM | POA: Diagnosis not present

## 2020-05-27 DIAGNOSIS — I421 Obstructive hypertrophic cardiomyopathy: Secondary | ICD-10-CM | POA: Diagnosis not present

## 2020-05-27 DIAGNOSIS — I130 Hypertensive heart and chronic kidney disease with heart failure and stage 1 through stage 4 chronic kidney disease, or unspecified chronic kidney disease: Secondary | ICD-10-CM | POA: Diagnosis not present

## 2020-05-27 DIAGNOSIS — I509 Heart failure, unspecified: Secondary | ICD-10-CM | POA: Diagnosis not present

## 2020-05-27 DIAGNOSIS — F0390 Unspecified dementia without behavioral disturbance: Secondary | ICD-10-CM | POA: Diagnosis not present

## 2020-05-27 DIAGNOSIS — D631 Anemia in chronic kidney disease: Secondary | ICD-10-CM | POA: Diagnosis not present

## 2020-05-31 DIAGNOSIS — I509 Heart failure, unspecified: Secondary | ICD-10-CM | POA: Diagnosis not present

## 2020-05-31 DIAGNOSIS — I130 Hypertensive heart and chronic kidney disease with heart failure and stage 1 through stage 4 chronic kidney disease, or unspecified chronic kidney disease: Secondary | ICD-10-CM | POA: Diagnosis not present

## 2020-05-31 DIAGNOSIS — D631 Anemia in chronic kidney disease: Secondary | ICD-10-CM | POA: Diagnosis not present

## 2020-05-31 DIAGNOSIS — F0390 Unspecified dementia without behavioral disturbance: Secondary | ICD-10-CM | POA: Diagnosis not present

## 2020-05-31 DIAGNOSIS — I421 Obstructive hypertrophic cardiomyopathy: Secondary | ICD-10-CM | POA: Diagnosis not present

## 2020-05-31 DIAGNOSIS — N183 Chronic kidney disease, stage 3 unspecified: Secondary | ICD-10-CM | POA: Diagnosis not present

## 2020-06-01 DIAGNOSIS — N183 Chronic kidney disease, stage 3 unspecified: Secondary | ICD-10-CM | POA: Diagnosis not present

## 2020-06-01 DIAGNOSIS — I509 Heart failure, unspecified: Secondary | ICD-10-CM | POA: Diagnosis not present

## 2020-06-01 DIAGNOSIS — F0390 Unspecified dementia without behavioral disturbance: Secondary | ICD-10-CM | POA: Diagnosis not present

## 2020-06-01 DIAGNOSIS — I130 Hypertensive heart and chronic kidney disease with heart failure and stage 1 through stage 4 chronic kidney disease, or unspecified chronic kidney disease: Secondary | ICD-10-CM | POA: Diagnosis not present

## 2020-06-01 DIAGNOSIS — D631 Anemia in chronic kidney disease: Secondary | ICD-10-CM | POA: Diagnosis not present

## 2020-06-01 DIAGNOSIS — I421 Obstructive hypertrophic cardiomyopathy: Secondary | ICD-10-CM | POA: Diagnosis not present

## 2020-06-03 DIAGNOSIS — D631 Anemia in chronic kidney disease: Secondary | ICD-10-CM | POA: Diagnosis not present

## 2020-06-03 DIAGNOSIS — I421 Obstructive hypertrophic cardiomyopathy: Secondary | ICD-10-CM | POA: Diagnosis not present

## 2020-06-03 DIAGNOSIS — I130 Hypertensive heart and chronic kidney disease with heart failure and stage 1 through stage 4 chronic kidney disease, or unspecified chronic kidney disease: Secondary | ICD-10-CM | POA: Diagnosis not present

## 2020-06-03 DIAGNOSIS — I509 Heart failure, unspecified: Secondary | ICD-10-CM | POA: Diagnosis not present

## 2020-06-03 DIAGNOSIS — F0390 Unspecified dementia without behavioral disturbance: Secondary | ICD-10-CM | POA: Diagnosis not present

## 2020-06-03 DIAGNOSIS — N183 Chronic kidney disease, stage 3 unspecified: Secondary | ICD-10-CM | POA: Diagnosis not present

## 2020-06-04 DIAGNOSIS — Z993 Dependence on wheelchair: Secondary | ICD-10-CM | POA: Diagnosis not present

## 2020-06-04 DIAGNOSIS — Z87440 Personal history of urinary (tract) infections: Secondary | ICD-10-CM | POA: Diagnosis not present

## 2020-06-04 DIAGNOSIS — I130 Hypertensive heart and chronic kidney disease with heart failure and stage 1 through stage 4 chronic kidney disease, or unspecified chronic kidney disease: Secondary | ICD-10-CM | POA: Diagnosis not present

## 2020-06-04 DIAGNOSIS — Z6822 Body mass index (BMI) 22.0-22.9, adult: Secondary | ICD-10-CM | POA: Diagnosis not present

## 2020-06-04 DIAGNOSIS — I700 Atherosclerosis of aorta: Secondary | ICD-10-CM | POA: Diagnosis not present

## 2020-06-04 DIAGNOSIS — D631 Anemia in chronic kidney disease: Secondary | ICD-10-CM | POA: Diagnosis not present

## 2020-06-04 DIAGNOSIS — R627 Adult failure to thrive: Secondary | ICD-10-CM | POA: Diagnosis not present

## 2020-06-04 DIAGNOSIS — I421 Obstructive hypertrophic cardiomyopathy: Secondary | ICD-10-CM | POA: Diagnosis not present

## 2020-06-04 DIAGNOSIS — R32 Unspecified urinary incontinence: Secondary | ICD-10-CM | POA: Diagnosis not present

## 2020-06-04 DIAGNOSIS — R159 Full incontinence of feces: Secondary | ICD-10-CM | POA: Diagnosis not present

## 2020-06-04 DIAGNOSIS — I509 Heart failure, unspecified: Secondary | ICD-10-CM | POA: Diagnosis not present

## 2020-06-04 DIAGNOSIS — N952 Postmenopausal atrophic vaginitis: Secondary | ICD-10-CM | POA: Diagnosis not present

## 2020-06-04 DIAGNOSIS — N183 Chronic kidney disease, stage 3 unspecified: Secondary | ICD-10-CM | POA: Diagnosis not present

## 2020-06-04 DIAGNOSIS — F0390 Unspecified dementia without behavioral disturbance: Secondary | ICD-10-CM | POA: Diagnosis not present

## 2020-06-08 DIAGNOSIS — F0390 Unspecified dementia without behavioral disturbance: Secondary | ICD-10-CM | POA: Diagnosis not present

## 2020-06-08 DIAGNOSIS — I130 Hypertensive heart and chronic kidney disease with heart failure and stage 1 through stage 4 chronic kidney disease, or unspecified chronic kidney disease: Secondary | ICD-10-CM | POA: Diagnosis not present

## 2020-06-08 DIAGNOSIS — N183 Chronic kidney disease, stage 3 unspecified: Secondary | ICD-10-CM | POA: Diagnosis not present

## 2020-06-08 DIAGNOSIS — D631 Anemia in chronic kidney disease: Secondary | ICD-10-CM | POA: Diagnosis not present

## 2020-06-08 DIAGNOSIS — I509 Heart failure, unspecified: Secondary | ICD-10-CM | POA: Diagnosis not present

## 2020-06-08 DIAGNOSIS — I421 Obstructive hypertrophic cardiomyopathy: Secondary | ICD-10-CM | POA: Diagnosis not present

## 2020-06-10 DIAGNOSIS — I509 Heart failure, unspecified: Secondary | ICD-10-CM | POA: Diagnosis not present

## 2020-06-10 DIAGNOSIS — N183 Chronic kidney disease, stage 3 unspecified: Secondary | ICD-10-CM | POA: Diagnosis not present

## 2020-06-10 DIAGNOSIS — F0390 Unspecified dementia without behavioral disturbance: Secondary | ICD-10-CM | POA: Diagnosis not present

## 2020-06-10 DIAGNOSIS — I130 Hypertensive heart and chronic kidney disease with heart failure and stage 1 through stage 4 chronic kidney disease, or unspecified chronic kidney disease: Secondary | ICD-10-CM | POA: Diagnosis not present

## 2020-06-10 DIAGNOSIS — D631 Anemia in chronic kidney disease: Secondary | ICD-10-CM | POA: Diagnosis not present

## 2020-06-10 DIAGNOSIS — I421 Obstructive hypertrophic cardiomyopathy: Secondary | ICD-10-CM | POA: Diagnosis not present

## 2020-06-14 DIAGNOSIS — F0390 Unspecified dementia without behavioral disturbance: Secondary | ICD-10-CM | POA: Diagnosis not present

## 2020-06-14 DIAGNOSIS — I10 Essential (primary) hypertension: Secondary | ICD-10-CM | POA: Diagnosis not present

## 2020-06-14 DIAGNOSIS — I509 Heart failure, unspecified: Secondary | ICD-10-CM | POA: Diagnosis not present

## 2020-06-14 DIAGNOSIS — N952 Postmenopausal atrophic vaginitis: Secondary | ICD-10-CM | POA: Diagnosis not present

## 2020-06-15 DIAGNOSIS — N183 Chronic kidney disease, stage 3 unspecified: Secondary | ICD-10-CM | POA: Diagnosis not present

## 2020-06-15 DIAGNOSIS — I130 Hypertensive heart and chronic kidney disease with heart failure and stage 1 through stage 4 chronic kidney disease, or unspecified chronic kidney disease: Secondary | ICD-10-CM | POA: Diagnosis not present

## 2020-06-15 DIAGNOSIS — I421 Obstructive hypertrophic cardiomyopathy: Secondary | ICD-10-CM | POA: Diagnosis not present

## 2020-06-15 DIAGNOSIS — I509 Heart failure, unspecified: Secondary | ICD-10-CM | POA: Diagnosis not present

## 2020-06-15 DIAGNOSIS — D631 Anemia in chronic kidney disease: Secondary | ICD-10-CM | POA: Diagnosis not present

## 2020-06-15 DIAGNOSIS — F0390 Unspecified dementia without behavioral disturbance: Secondary | ICD-10-CM | POA: Diagnosis not present

## 2020-06-17 DIAGNOSIS — I509 Heart failure, unspecified: Secondary | ICD-10-CM | POA: Diagnosis not present

## 2020-06-17 DIAGNOSIS — N183 Chronic kidney disease, stage 3 unspecified: Secondary | ICD-10-CM | POA: Diagnosis not present

## 2020-06-17 DIAGNOSIS — I130 Hypertensive heart and chronic kidney disease with heart failure and stage 1 through stage 4 chronic kidney disease, or unspecified chronic kidney disease: Secondary | ICD-10-CM | POA: Diagnosis not present

## 2020-06-17 DIAGNOSIS — D631 Anemia in chronic kidney disease: Secondary | ICD-10-CM | POA: Diagnosis not present

## 2020-06-17 DIAGNOSIS — I421 Obstructive hypertrophic cardiomyopathy: Secondary | ICD-10-CM | POA: Diagnosis not present

## 2020-06-17 DIAGNOSIS — F0390 Unspecified dementia without behavioral disturbance: Secondary | ICD-10-CM | POA: Diagnosis not present

## 2020-06-17 DIAGNOSIS — Z23 Encounter for immunization: Secondary | ICD-10-CM | POA: Diagnosis not present

## 2020-06-22 DIAGNOSIS — N183 Chronic kidney disease, stage 3 unspecified: Secondary | ICD-10-CM | POA: Diagnosis not present

## 2020-06-22 DIAGNOSIS — I421 Obstructive hypertrophic cardiomyopathy: Secondary | ICD-10-CM | POA: Diagnosis not present

## 2020-06-22 DIAGNOSIS — I130 Hypertensive heart and chronic kidney disease with heart failure and stage 1 through stage 4 chronic kidney disease, or unspecified chronic kidney disease: Secondary | ICD-10-CM | POA: Diagnosis not present

## 2020-06-22 DIAGNOSIS — D631 Anemia in chronic kidney disease: Secondary | ICD-10-CM | POA: Diagnosis not present

## 2020-06-22 DIAGNOSIS — F0390 Unspecified dementia without behavioral disturbance: Secondary | ICD-10-CM | POA: Diagnosis not present

## 2020-06-22 DIAGNOSIS — I509 Heart failure, unspecified: Secondary | ICD-10-CM | POA: Diagnosis not present

## 2020-06-24 DIAGNOSIS — I509 Heart failure, unspecified: Secondary | ICD-10-CM | POA: Diagnosis not present

## 2020-06-24 DIAGNOSIS — I130 Hypertensive heart and chronic kidney disease with heart failure and stage 1 through stage 4 chronic kidney disease, or unspecified chronic kidney disease: Secondary | ICD-10-CM | POA: Diagnosis not present

## 2020-06-24 DIAGNOSIS — D631 Anemia in chronic kidney disease: Secondary | ICD-10-CM | POA: Diagnosis not present

## 2020-06-24 DIAGNOSIS — I421 Obstructive hypertrophic cardiomyopathy: Secondary | ICD-10-CM | POA: Diagnosis not present

## 2020-06-24 DIAGNOSIS — F0390 Unspecified dementia without behavioral disturbance: Secondary | ICD-10-CM | POA: Diagnosis not present

## 2020-06-24 DIAGNOSIS — N183 Chronic kidney disease, stage 3 unspecified: Secondary | ICD-10-CM | POA: Diagnosis not present

## 2020-06-29 DIAGNOSIS — I509 Heart failure, unspecified: Secondary | ICD-10-CM | POA: Diagnosis not present

## 2020-06-29 DIAGNOSIS — I421 Obstructive hypertrophic cardiomyopathy: Secondary | ICD-10-CM | POA: Diagnosis not present

## 2020-06-29 DIAGNOSIS — I130 Hypertensive heart and chronic kidney disease with heart failure and stage 1 through stage 4 chronic kidney disease, or unspecified chronic kidney disease: Secondary | ICD-10-CM | POA: Diagnosis not present

## 2020-06-29 DIAGNOSIS — D631 Anemia in chronic kidney disease: Secondary | ICD-10-CM | POA: Diagnosis not present

## 2020-06-29 DIAGNOSIS — F0390 Unspecified dementia without behavioral disturbance: Secondary | ICD-10-CM | POA: Diagnosis not present

## 2020-06-29 DIAGNOSIS — N183 Chronic kidney disease, stage 3 unspecified: Secondary | ICD-10-CM | POA: Diagnosis not present

## 2020-07-01 DIAGNOSIS — D631 Anemia in chronic kidney disease: Secondary | ICD-10-CM | POA: Diagnosis not present

## 2020-07-01 DIAGNOSIS — I130 Hypertensive heart and chronic kidney disease with heart failure and stage 1 through stage 4 chronic kidney disease, or unspecified chronic kidney disease: Secondary | ICD-10-CM | POA: Diagnosis not present

## 2020-07-01 DIAGNOSIS — I509 Heart failure, unspecified: Secondary | ICD-10-CM | POA: Diagnosis not present

## 2020-07-01 DIAGNOSIS — I421 Obstructive hypertrophic cardiomyopathy: Secondary | ICD-10-CM | POA: Diagnosis not present

## 2020-07-01 DIAGNOSIS — F0390 Unspecified dementia without behavioral disturbance: Secondary | ICD-10-CM | POA: Diagnosis not present

## 2020-07-01 DIAGNOSIS — N183 Chronic kidney disease, stage 3 unspecified: Secondary | ICD-10-CM | POA: Diagnosis not present

## 2020-07-05 DIAGNOSIS — F0390 Unspecified dementia without behavioral disturbance: Secondary | ICD-10-CM | POA: Diagnosis not present

## 2020-07-05 DIAGNOSIS — I421 Obstructive hypertrophic cardiomyopathy: Secondary | ICD-10-CM | POA: Diagnosis not present

## 2020-07-05 DIAGNOSIS — Z87440 Personal history of urinary (tract) infections: Secondary | ICD-10-CM | POA: Diagnosis not present

## 2020-07-05 DIAGNOSIS — I130 Hypertensive heart and chronic kidney disease with heart failure and stage 1 through stage 4 chronic kidney disease, or unspecified chronic kidney disease: Secondary | ICD-10-CM | POA: Diagnosis not present

## 2020-07-05 DIAGNOSIS — N183 Chronic kidney disease, stage 3 unspecified: Secondary | ICD-10-CM | POA: Diagnosis not present

## 2020-07-05 DIAGNOSIS — Z6822 Body mass index (BMI) 22.0-22.9, adult: Secondary | ICD-10-CM | POA: Diagnosis not present

## 2020-07-05 DIAGNOSIS — D631 Anemia in chronic kidney disease: Secondary | ICD-10-CM | POA: Diagnosis not present

## 2020-07-05 DIAGNOSIS — R32 Unspecified urinary incontinence: Secondary | ICD-10-CM | POA: Diagnosis not present

## 2020-07-05 DIAGNOSIS — R627 Adult failure to thrive: Secondary | ICD-10-CM | POA: Diagnosis not present

## 2020-07-05 DIAGNOSIS — Z993 Dependence on wheelchair: Secondary | ICD-10-CM | POA: Diagnosis not present

## 2020-07-05 DIAGNOSIS — I509 Heart failure, unspecified: Secondary | ICD-10-CM | POA: Diagnosis not present

## 2020-07-05 DIAGNOSIS — R159 Full incontinence of feces: Secondary | ICD-10-CM | POA: Diagnosis not present

## 2020-07-05 DIAGNOSIS — I700 Atherosclerosis of aorta: Secondary | ICD-10-CM | POA: Diagnosis not present

## 2020-07-05 DIAGNOSIS — N952 Postmenopausal atrophic vaginitis: Secondary | ICD-10-CM | POA: Diagnosis not present

## 2020-07-06 DIAGNOSIS — F0390 Unspecified dementia without behavioral disturbance: Secondary | ICD-10-CM | POA: Diagnosis not present

## 2020-07-06 DIAGNOSIS — I421 Obstructive hypertrophic cardiomyopathy: Secondary | ICD-10-CM | POA: Diagnosis not present

## 2020-07-06 DIAGNOSIS — N183 Chronic kidney disease, stage 3 unspecified: Secondary | ICD-10-CM | POA: Diagnosis not present

## 2020-07-06 DIAGNOSIS — I130 Hypertensive heart and chronic kidney disease with heart failure and stage 1 through stage 4 chronic kidney disease, or unspecified chronic kidney disease: Secondary | ICD-10-CM | POA: Diagnosis not present

## 2020-07-06 DIAGNOSIS — D631 Anemia in chronic kidney disease: Secondary | ICD-10-CM | POA: Diagnosis not present

## 2020-07-06 DIAGNOSIS — I509 Heart failure, unspecified: Secondary | ICD-10-CM | POA: Diagnosis not present

## 2020-07-08 DIAGNOSIS — F0390 Unspecified dementia without behavioral disturbance: Secondary | ICD-10-CM | POA: Diagnosis not present

## 2020-07-08 DIAGNOSIS — D631 Anemia in chronic kidney disease: Secondary | ICD-10-CM | POA: Diagnosis not present

## 2020-07-08 DIAGNOSIS — N183 Chronic kidney disease, stage 3 unspecified: Secondary | ICD-10-CM | POA: Diagnosis not present

## 2020-07-08 DIAGNOSIS — I421 Obstructive hypertrophic cardiomyopathy: Secondary | ICD-10-CM | POA: Diagnosis not present

## 2020-07-08 DIAGNOSIS — I509 Heart failure, unspecified: Secondary | ICD-10-CM | POA: Diagnosis not present

## 2020-07-08 DIAGNOSIS — I130 Hypertensive heart and chronic kidney disease with heart failure and stage 1 through stage 4 chronic kidney disease, or unspecified chronic kidney disease: Secondary | ICD-10-CM | POA: Diagnosis not present

## 2020-07-13 DIAGNOSIS — I421 Obstructive hypertrophic cardiomyopathy: Secondary | ICD-10-CM | POA: Diagnosis not present

## 2020-07-13 DIAGNOSIS — D631 Anemia in chronic kidney disease: Secondary | ICD-10-CM | POA: Diagnosis not present

## 2020-07-13 DIAGNOSIS — F0390 Unspecified dementia without behavioral disturbance: Secondary | ICD-10-CM | POA: Diagnosis not present

## 2020-07-13 DIAGNOSIS — I130 Hypertensive heart and chronic kidney disease with heart failure and stage 1 through stage 4 chronic kidney disease, or unspecified chronic kidney disease: Secondary | ICD-10-CM | POA: Diagnosis not present

## 2020-07-13 DIAGNOSIS — I509 Heart failure, unspecified: Secondary | ICD-10-CM | POA: Diagnosis not present

## 2020-07-13 DIAGNOSIS — N183 Chronic kidney disease, stage 3 unspecified: Secondary | ICD-10-CM | POA: Diagnosis not present

## 2020-07-15 DIAGNOSIS — Z23 Encounter for immunization: Secondary | ICD-10-CM | POA: Diagnosis not present

## 2020-07-15 DIAGNOSIS — I509 Heart failure, unspecified: Secondary | ICD-10-CM | POA: Diagnosis not present

## 2020-07-15 DIAGNOSIS — N183 Chronic kidney disease, stage 3 unspecified: Secondary | ICD-10-CM | POA: Diagnosis not present

## 2020-07-15 DIAGNOSIS — I130 Hypertensive heart and chronic kidney disease with heart failure and stage 1 through stage 4 chronic kidney disease, or unspecified chronic kidney disease: Secondary | ICD-10-CM | POA: Diagnosis not present

## 2020-07-15 DIAGNOSIS — D631 Anemia in chronic kidney disease: Secondary | ICD-10-CM | POA: Diagnosis not present

## 2020-07-15 DIAGNOSIS — I421 Obstructive hypertrophic cardiomyopathy: Secondary | ICD-10-CM | POA: Diagnosis not present

## 2020-07-15 DIAGNOSIS — F0390 Unspecified dementia without behavioral disturbance: Secondary | ICD-10-CM | POA: Diagnosis not present

## 2020-07-20 DIAGNOSIS — F0390 Unspecified dementia without behavioral disturbance: Secondary | ICD-10-CM | POA: Diagnosis not present

## 2020-07-20 DIAGNOSIS — I509 Heart failure, unspecified: Secondary | ICD-10-CM | POA: Diagnosis not present

## 2020-07-20 DIAGNOSIS — N183 Chronic kidney disease, stage 3 unspecified: Secondary | ICD-10-CM | POA: Diagnosis not present

## 2020-07-20 DIAGNOSIS — I130 Hypertensive heart and chronic kidney disease with heart failure and stage 1 through stage 4 chronic kidney disease, or unspecified chronic kidney disease: Secondary | ICD-10-CM | POA: Diagnosis not present

## 2020-07-20 DIAGNOSIS — I421 Obstructive hypertrophic cardiomyopathy: Secondary | ICD-10-CM | POA: Diagnosis not present

## 2020-07-20 DIAGNOSIS — D631 Anemia in chronic kidney disease: Secondary | ICD-10-CM | POA: Diagnosis not present

## 2020-07-22 DIAGNOSIS — F0390 Unspecified dementia without behavioral disturbance: Secondary | ICD-10-CM | POA: Diagnosis not present

## 2020-07-22 DIAGNOSIS — I130 Hypertensive heart and chronic kidney disease with heart failure and stage 1 through stage 4 chronic kidney disease, or unspecified chronic kidney disease: Secondary | ICD-10-CM | POA: Diagnosis not present

## 2020-07-22 DIAGNOSIS — I421 Obstructive hypertrophic cardiomyopathy: Secondary | ICD-10-CM | POA: Diagnosis not present

## 2020-07-22 DIAGNOSIS — D631 Anemia in chronic kidney disease: Secondary | ICD-10-CM | POA: Diagnosis not present

## 2020-07-22 DIAGNOSIS — I509 Heart failure, unspecified: Secondary | ICD-10-CM | POA: Diagnosis not present

## 2020-07-22 DIAGNOSIS — N183 Chronic kidney disease, stage 3 unspecified: Secondary | ICD-10-CM | POA: Diagnosis not present

## 2020-07-26 DIAGNOSIS — I421 Obstructive hypertrophic cardiomyopathy: Secondary | ICD-10-CM | POA: Diagnosis not present

## 2020-07-26 DIAGNOSIS — I130 Hypertensive heart and chronic kidney disease with heart failure and stage 1 through stage 4 chronic kidney disease, or unspecified chronic kidney disease: Secondary | ICD-10-CM | POA: Diagnosis not present

## 2020-07-26 DIAGNOSIS — F0390 Unspecified dementia without behavioral disturbance: Secondary | ICD-10-CM | POA: Diagnosis not present

## 2020-07-26 DIAGNOSIS — I509 Heart failure, unspecified: Secondary | ICD-10-CM | POA: Diagnosis not present

## 2020-07-26 DIAGNOSIS — D631 Anemia in chronic kidney disease: Secondary | ICD-10-CM | POA: Diagnosis not present

## 2020-07-26 DIAGNOSIS — N183 Chronic kidney disease, stage 3 unspecified: Secondary | ICD-10-CM | POA: Diagnosis not present

## 2020-07-27 DIAGNOSIS — N183 Chronic kidney disease, stage 3 unspecified: Secondary | ICD-10-CM | POA: Diagnosis not present

## 2020-07-27 DIAGNOSIS — I130 Hypertensive heart and chronic kidney disease with heart failure and stage 1 through stage 4 chronic kidney disease, or unspecified chronic kidney disease: Secondary | ICD-10-CM | POA: Diagnosis not present

## 2020-07-27 DIAGNOSIS — D631 Anemia in chronic kidney disease: Secondary | ICD-10-CM | POA: Diagnosis not present

## 2020-07-27 DIAGNOSIS — I509 Heart failure, unspecified: Secondary | ICD-10-CM | POA: Diagnosis not present

## 2020-07-27 DIAGNOSIS — F0390 Unspecified dementia without behavioral disturbance: Secondary | ICD-10-CM | POA: Diagnosis not present

## 2020-07-27 DIAGNOSIS — I421 Obstructive hypertrophic cardiomyopathy: Secondary | ICD-10-CM | POA: Diagnosis not present

## 2020-07-29 DIAGNOSIS — I130 Hypertensive heart and chronic kidney disease with heart failure and stage 1 through stage 4 chronic kidney disease, or unspecified chronic kidney disease: Secondary | ICD-10-CM | POA: Diagnosis not present

## 2020-07-29 DIAGNOSIS — D631 Anemia in chronic kidney disease: Secondary | ICD-10-CM | POA: Diagnosis not present

## 2020-07-29 DIAGNOSIS — I421 Obstructive hypertrophic cardiomyopathy: Secondary | ICD-10-CM | POA: Diagnosis not present

## 2020-07-29 DIAGNOSIS — I509 Heart failure, unspecified: Secondary | ICD-10-CM | POA: Diagnosis not present

## 2020-07-29 DIAGNOSIS — N183 Chronic kidney disease, stage 3 unspecified: Secondary | ICD-10-CM | POA: Diagnosis not present

## 2020-07-29 DIAGNOSIS — F0390 Unspecified dementia without behavioral disturbance: Secondary | ICD-10-CM | POA: Diagnosis not present

## 2020-08-02 DIAGNOSIS — I509 Heart failure, unspecified: Secondary | ICD-10-CM | POA: Diagnosis not present

## 2020-08-02 DIAGNOSIS — D631 Anemia in chronic kidney disease: Secondary | ICD-10-CM | POA: Diagnosis not present

## 2020-08-02 DIAGNOSIS — F0390 Unspecified dementia without behavioral disturbance: Secondary | ICD-10-CM | POA: Diagnosis not present

## 2020-08-02 DIAGNOSIS — I130 Hypertensive heart and chronic kidney disease with heart failure and stage 1 through stage 4 chronic kidney disease, or unspecified chronic kidney disease: Secondary | ICD-10-CM | POA: Diagnosis not present

## 2020-08-02 DIAGNOSIS — I421 Obstructive hypertrophic cardiomyopathy: Secondary | ICD-10-CM | POA: Diagnosis not present

## 2020-08-02 DIAGNOSIS — N390 Urinary tract infection, site not specified: Secondary | ICD-10-CM | POA: Diagnosis not present

## 2020-08-02 DIAGNOSIS — B351 Tinea unguium: Secondary | ICD-10-CM | POA: Diagnosis not present

## 2020-08-02 DIAGNOSIS — N183 Chronic kidney disease, stage 3 unspecified: Secondary | ICD-10-CM | POA: Diagnosis not present

## 2020-08-02 DIAGNOSIS — I739 Peripheral vascular disease, unspecified: Secondary | ICD-10-CM | POA: Diagnosis not present

## 2020-08-03 DIAGNOSIS — N183 Chronic kidney disease, stage 3 unspecified: Secondary | ICD-10-CM | POA: Diagnosis not present

## 2020-08-03 DIAGNOSIS — I130 Hypertensive heart and chronic kidney disease with heart failure and stage 1 through stage 4 chronic kidney disease, or unspecified chronic kidney disease: Secondary | ICD-10-CM | POA: Diagnosis not present

## 2020-08-03 DIAGNOSIS — F0390 Unspecified dementia without behavioral disturbance: Secondary | ICD-10-CM | POA: Diagnosis not present

## 2020-08-03 DIAGNOSIS — I421 Obstructive hypertrophic cardiomyopathy: Secondary | ICD-10-CM | POA: Diagnosis not present

## 2020-08-03 DIAGNOSIS — D631 Anemia in chronic kidney disease: Secondary | ICD-10-CM | POA: Diagnosis not present

## 2020-08-03 DIAGNOSIS — I509 Heart failure, unspecified: Secondary | ICD-10-CM | POA: Diagnosis not present

## 2020-08-04 DIAGNOSIS — I700 Atherosclerosis of aorta: Secondary | ICD-10-CM | POA: Diagnosis not present

## 2020-08-04 DIAGNOSIS — Z993 Dependence on wheelchair: Secondary | ICD-10-CM | POA: Diagnosis not present

## 2020-08-04 DIAGNOSIS — R159 Full incontinence of feces: Secondary | ICD-10-CM | POA: Diagnosis not present

## 2020-08-04 DIAGNOSIS — R32 Unspecified urinary incontinence: Secondary | ICD-10-CM | POA: Diagnosis not present

## 2020-08-04 DIAGNOSIS — D631 Anemia in chronic kidney disease: Secondary | ICD-10-CM | POA: Diagnosis not present

## 2020-08-04 DIAGNOSIS — F0390 Unspecified dementia without behavioral disturbance: Secondary | ICD-10-CM | POA: Diagnosis not present

## 2020-08-04 DIAGNOSIS — N952 Postmenopausal atrophic vaginitis: Secondary | ICD-10-CM | POA: Diagnosis not present

## 2020-08-04 DIAGNOSIS — Z6822 Body mass index (BMI) 22.0-22.9, adult: Secondary | ICD-10-CM | POA: Diagnosis not present

## 2020-08-04 DIAGNOSIS — R627 Adult failure to thrive: Secondary | ICD-10-CM | POA: Diagnosis not present

## 2020-08-04 DIAGNOSIS — I130 Hypertensive heart and chronic kidney disease with heart failure and stage 1 through stage 4 chronic kidney disease, or unspecified chronic kidney disease: Secondary | ICD-10-CM | POA: Diagnosis not present

## 2020-08-04 DIAGNOSIS — I509 Heart failure, unspecified: Secondary | ICD-10-CM | POA: Diagnosis not present

## 2020-08-04 DIAGNOSIS — Z87440 Personal history of urinary (tract) infections: Secondary | ICD-10-CM | POA: Diagnosis not present

## 2020-08-04 DIAGNOSIS — N183 Chronic kidney disease, stage 3 unspecified: Secondary | ICD-10-CM | POA: Diagnosis not present

## 2020-08-04 DIAGNOSIS — I421 Obstructive hypertrophic cardiomyopathy: Secondary | ICD-10-CM | POA: Diagnosis not present

## 2020-08-05 DIAGNOSIS — I130 Hypertensive heart and chronic kidney disease with heart failure and stage 1 through stage 4 chronic kidney disease, or unspecified chronic kidney disease: Secondary | ICD-10-CM | POA: Diagnosis not present

## 2020-08-05 DIAGNOSIS — F0390 Unspecified dementia without behavioral disturbance: Secondary | ICD-10-CM | POA: Diagnosis not present

## 2020-08-05 DIAGNOSIS — D631 Anemia in chronic kidney disease: Secondary | ICD-10-CM | POA: Diagnosis not present

## 2020-08-05 DIAGNOSIS — I421 Obstructive hypertrophic cardiomyopathy: Secondary | ICD-10-CM | POA: Diagnosis not present

## 2020-08-05 DIAGNOSIS — I509 Heart failure, unspecified: Secondary | ICD-10-CM | POA: Diagnosis not present

## 2020-08-05 DIAGNOSIS — N183 Chronic kidney disease, stage 3 unspecified: Secondary | ICD-10-CM | POA: Diagnosis not present

## 2020-08-09 DIAGNOSIS — N390 Urinary tract infection, site not specified: Secondary | ICD-10-CM | POA: Diagnosis not present

## 2020-08-09 DIAGNOSIS — F0390 Unspecified dementia without behavioral disturbance: Secondary | ICD-10-CM | POA: Diagnosis not present

## 2020-08-09 DIAGNOSIS — I509 Heart failure, unspecified: Secondary | ICD-10-CM | POA: Diagnosis not present

## 2020-08-09 DIAGNOSIS — I10 Essential (primary) hypertension: Secondary | ICD-10-CM | POA: Diagnosis not present

## 2020-08-09 DIAGNOSIS — N952 Postmenopausal atrophic vaginitis: Secondary | ICD-10-CM | POA: Diagnosis not present

## 2020-08-10 DIAGNOSIS — D631 Anemia in chronic kidney disease: Secondary | ICD-10-CM | POA: Diagnosis not present

## 2020-08-10 DIAGNOSIS — I421 Obstructive hypertrophic cardiomyopathy: Secondary | ICD-10-CM | POA: Diagnosis not present

## 2020-08-10 DIAGNOSIS — I130 Hypertensive heart and chronic kidney disease with heart failure and stage 1 through stage 4 chronic kidney disease, or unspecified chronic kidney disease: Secondary | ICD-10-CM | POA: Diagnosis not present

## 2020-08-10 DIAGNOSIS — I509 Heart failure, unspecified: Secondary | ICD-10-CM | POA: Diagnosis not present

## 2020-08-10 DIAGNOSIS — N183 Chronic kidney disease, stage 3 unspecified: Secondary | ICD-10-CM | POA: Diagnosis not present

## 2020-08-10 DIAGNOSIS — F0390 Unspecified dementia without behavioral disturbance: Secondary | ICD-10-CM | POA: Diagnosis not present

## 2020-08-12 DIAGNOSIS — D631 Anemia in chronic kidney disease: Secondary | ICD-10-CM | POA: Diagnosis not present

## 2020-08-12 DIAGNOSIS — I421 Obstructive hypertrophic cardiomyopathy: Secondary | ICD-10-CM | POA: Diagnosis not present

## 2020-08-12 DIAGNOSIS — I509 Heart failure, unspecified: Secondary | ICD-10-CM | POA: Diagnosis not present

## 2020-08-12 DIAGNOSIS — I130 Hypertensive heart and chronic kidney disease with heart failure and stage 1 through stage 4 chronic kidney disease, or unspecified chronic kidney disease: Secondary | ICD-10-CM | POA: Diagnosis not present

## 2020-08-12 DIAGNOSIS — N183 Chronic kidney disease, stage 3 unspecified: Secondary | ICD-10-CM | POA: Diagnosis not present

## 2020-08-12 DIAGNOSIS — F0390 Unspecified dementia without behavioral disturbance: Secondary | ICD-10-CM | POA: Diagnosis not present

## 2020-08-13 DIAGNOSIS — I509 Heart failure, unspecified: Secondary | ICD-10-CM | POA: Diagnosis not present

## 2020-08-13 DIAGNOSIS — I130 Hypertensive heart and chronic kidney disease with heart failure and stage 1 through stage 4 chronic kidney disease, or unspecified chronic kidney disease: Secondary | ICD-10-CM | POA: Diagnosis not present

## 2020-08-13 DIAGNOSIS — N183 Chronic kidney disease, stage 3 unspecified: Secondary | ICD-10-CM | POA: Diagnosis not present

## 2020-08-13 DIAGNOSIS — D631 Anemia in chronic kidney disease: Secondary | ICD-10-CM | POA: Diagnosis not present

## 2020-08-13 DIAGNOSIS — F0390 Unspecified dementia without behavioral disturbance: Secondary | ICD-10-CM | POA: Diagnosis not present

## 2020-08-13 DIAGNOSIS — I421 Obstructive hypertrophic cardiomyopathy: Secondary | ICD-10-CM | POA: Diagnosis not present

## 2020-08-16 DIAGNOSIS — M79606 Pain in leg, unspecified: Secondary | ICD-10-CM | POA: Diagnosis not present

## 2020-08-16 DIAGNOSIS — I130 Hypertensive heart and chronic kidney disease with heart failure and stage 1 through stage 4 chronic kidney disease, or unspecified chronic kidney disease: Secondary | ICD-10-CM | POA: Diagnosis not present

## 2020-08-16 DIAGNOSIS — Z1339 Encounter for screening examination for other mental health and behavioral disorders: Secondary | ICD-10-CM | POA: Diagnosis not present

## 2020-08-16 DIAGNOSIS — N390 Urinary tract infection, site not specified: Secondary | ICD-10-CM | POA: Diagnosis not present

## 2020-08-16 DIAGNOSIS — N952 Postmenopausal atrophic vaginitis: Secondary | ICD-10-CM | POA: Diagnosis not present

## 2020-08-16 DIAGNOSIS — I509 Heart failure, unspecified: Secondary | ICD-10-CM | POA: Diagnosis not present

## 2020-08-16 DIAGNOSIS — Z1331 Encounter for screening for depression: Secondary | ICD-10-CM | POA: Diagnosis not present

## 2020-08-16 DIAGNOSIS — I421 Obstructive hypertrophic cardiomyopathy: Secondary | ICD-10-CM | POA: Diagnosis not present

## 2020-08-16 DIAGNOSIS — D631 Anemia in chronic kidney disease: Secondary | ICD-10-CM | POA: Diagnosis not present

## 2020-08-16 DIAGNOSIS — F0390 Unspecified dementia without behavioral disturbance: Secondary | ICD-10-CM | POA: Diagnosis not present

## 2020-08-16 DIAGNOSIS — Z0000 Encounter for general adult medical examination without abnormal findings: Secondary | ICD-10-CM | POA: Diagnosis not present

## 2020-08-16 DIAGNOSIS — N183 Chronic kidney disease, stage 3 unspecified: Secondary | ICD-10-CM | POA: Diagnosis not present

## 2020-08-17 DIAGNOSIS — D631 Anemia in chronic kidney disease: Secondary | ICD-10-CM | POA: Diagnosis not present

## 2020-08-17 DIAGNOSIS — I509 Heart failure, unspecified: Secondary | ICD-10-CM | POA: Diagnosis not present

## 2020-08-17 DIAGNOSIS — N183 Chronic kidney disease, stage 3 unspecified: Secondary | ICD-10-CM | POA: Diagnosis not present

## 2020-08-17 DIAGNOSIS — F0390 Unspecified dementia without behavioral disturbance: Secondary | ICD-10-CM | POA: Diagnosis not present

## 2020-08-17 DIAGNOSIS — I421 Obstructive hypertrophic cardiomyopathy: Secondary | ICD-10-CM | POA: Diagnosis not present

## 2020-08-17 DIAGNOSIS — I130 Hypertensive heart and chronic kidney disease with heart failure and stage 1 through stage 4 chronic kidney disease, or unspecified chronic kidney disease: Secondary | ICD-10-CM | POA: Diagnosis not present

## 2020-08-19 DIAGNOSIS — I509 Heart failure, unspecified: Secondary | ICD-10-CM | POA: Diagnosis not present

## 2020-08-19 DIAGNOSIS — I130 Hypertensive heart and chronic kidney disease with heart failure and stage 1 through stage 4 chronic kidney disease, or unspecified chronic kidney disease: Secondary | ICD-10-CM | POA: Diagnosis not present

## 2020-08-19 DIAGNOSIS — D631 Anemia in chronic kidney disease: Secondary | ICD-10-CM | POA: Diagnosis not present

## 2020-08-19 DIAGNOSIS — N183 Chronic kidney disease, stage 3 unspecified: Secondary | ICD-10-CM | POA: Diagnosis not present

## 2020-08-19 DIAGNOSIS — F0390 Unspecified dementia without behavioral disturbance: Secondary | ICD-10-CM | POA: Diagnosis not present

## 2020-08-19 DIAGNOSIS — I421 Obstructive hypertrophic cardiomyopathy: Secondary | ICD-10-CM | POA: Diagnosis not present

## 2020-08-23 DIAGNOSIS — N183 Chronic kidney disease, stage 3 unspecified: Secondary | ICD-10-CM | POA: Diagnosis not present

## 2020-08-23 DIAGNOSIS — I509 Heart failure, unspecified: Secondary | ICD-10-CM | POA: Diagnosis not present

## 2020-08-23 DIAGNOSIS — F0390 Unspecified dementia without behavioral disturbance: Secondary | ICD-10-CM | POA: Diagnosis not present

## 2020-08-23 DIAGNOSIS — I130 Hypertensive heart and chronic kidney disease with heart failure and stage 1 through stage 4 chronic kidney disease, or unspecified chronic kidney disease: Secondary | ICD-10-CM | POA: Diagnosis not present

## 2020-08-23 DIAGNOSIS — I421 Obstructive hypertrophic cardiomyopathy: Secondary | ICD-10-CM | POA: Diagnosis not present

## 2020-08-23 DIAGNOSIS — D631 Anemia in chronic kidney disease: Secondary | ICD-10-CM | POA: Diagnosis not present

## 2020-08-24 DIAGNOSIS — N183 Chronic kidney disease, stage 3 unspecified: Secondary | ICD-10-CM | POA: Diagnosis not present

## 2020-08-24 DIAGNOSIS — I130 Hypertensive heart and chronic kidney disease with heart failure and stage 1 through stage 4 chronic kidney disease, or unspecified chronic kidney disease: Secondary | ICD-10-CM | POA: Diagnosis not present

## 2020-08-24 DIAGNOSIS — F0390 Unspecified dementia without behavioral disturbance: Secondary | ICD-10-CM | POA: Diagnosis not present

## 2020-08-24 DIAGNOSIS — I421 Obstructive hypertrophic cardiomyopathy: Secondary | ICD-10-CM | POA: Diagnosis not present

## 2020-08-24 DIAGNOSIS — I509 Heart failure, unspecified: Secondary | ICD-10-CM | POA: Diagnosis not present

## 2020-08-24 DIAGNOSIS — D631 Anemia in chronic kidney disease: Secondary | ICD-10-CM | POA: Diagnosis not present

## 2020-08-26 DIAGNOSIS — F0390 Unspecified dementia without behavioral disturbance: Secondary | ICD-10-CM | POA: Diagnosis not present

## 2020-08-26 DIAGNOSIS — N183 Chronic kidney disease, stage 3 unspecified: Secondary | ICD-10-CM | POA: Diagnosis not present

## 2020-08-26 DIAGNOSIS — I421 Obstructive hypertrophic cardiomyopathy: Secondary | ICD-10-CM | POA: Diagnosis not present

## 2020-08-26 DIAGNOSIS — I509 Heart failure, unspecified: Secondary | ICD-10-CM | POA: Diagnosis not present

## 2020-08-26 DIAGNOSIS — I130 Hypertensive heart and chronic kidney disease with heart failure and stage 1 through stage 4 chronic kidney disease, or unspecified chronic kidney disease: Secondary | ICD-10-CM | POA: Diagnosis not present

## 2020-08-26 DIAGNOSIS — D631 Anemia in chronic kidney disease: Secondary | ICD-10-CM | POA: Diagnosis not present

## 2020-08-30 DIAGNOSIS — N183 Chronic kidney disease, stage 3 unspecified: Secondary | ICD-10-CM | POA: Diagnosis not present

## 2020-08-30 DIAGNOSIS — I421 Obstructive hypertrophic cardiomyopathy: Secondary | ICD-10-CM | POA: Diagnosis not present

## 2020-08-30 DIAGNOSIS — D631 Anemia in chronic kidney disease: Secondary | ICD-10-CM | POA: Diagnosis not present

## 2020-08-30 DIAGNOSIS — F0390 Unspecified dementia without behavioral disturbance: Secondary | ICD-10-CM | POA: Diagnosis not present

## 2020-08-30 DIAGNOSIS — I130 Hypertensive heart and chronic kidney disease with heart failure and stage 1 through stage 4 chronic kidney disease, or unspecified chronic kidney disease: Secondary | ICD-10-CM | POA: Diagnosis not present

## 2020-08-30 DIAGNOSIS — I509 Heart failure, unspecified: Secondary | ICD-10-CM | POA: Diagnosis not present

## 2020-08-30 DIAGNOSIS — Z20828 Contact with and (suspected) exposure to other viral communicable diseases: Secondary | ICD-10-CM | POA: Diagnosis not present

## 2020-08-31 DIAGNOSIS — I509 Heart failure, unspecified: Secondary | ICD-10-CM | POA: Diagnosis not present

## 2020-08-31 DIAGNOSIS — N183 Chronic kidney disease, stage 3 unspecified: Secondary | ICD-10-CM | POA: Diagnosis not present

## 2020-08-31 DIAGNOSIS — I130 Hypertensive heart and chronic kidney disease with heart failure and stage 1 through stage 4 chronic kidney disease, or unspecified chronic kidney disease: Secondary | ICD-10-CM | POA: Diagnosis not present

## 2020-08-31 DIAGNOSIS — D631 Anemia in chronic kidney disease: Secondary | ICD-10-CM | POA: Diagnosis not present

## 2020-08-31 DIAGNOSIS — I421 Obstructive hypertrophic cardiomyopathy: Secondary | ICD-10-CM | POA: Diagnosis not present

## 2020-08-31 DIAGNOSIS — F0390 Unspecified dementia without behavioral disturbance: Secondary | ICD-10-CM | POA: Diagnosis not present

## 2020-09-02 DIAGNOSIS — F0390 Unspecified dementia without behavioral disturbance: Secondary | ICD-10-CM | POA: Diagnosis not present

## 2020-09-02 DIAGNOSIS — I509 Heart failure, unspecified: Secondary | ICD-10-CM | POA: Diagnosis not present

## 2020-09-02 DIAGNOSIS — I130 Hypertensive heart and chronic kidney disease with heart failure and stage 1 through stage 4 chronic kidney disease, or unspecified chronic kidney disease: Secondary | ICD-10-CM | POA: Diagnosis not present

## 2020-09-02 DIAGNOSIS — D631 Anemia in chronic kidney disease: Secondary | ICD-10-CM | POA: Diagnosis not present

## 2020-09-02 DIAGNOSIS — N183 Chronic kidney disease, stage 3 unspecified: Secondary | ICD-10-CM | POA: Diagnosis not present

## 2020-09-02 DIAGNOSIS — I421 Obstructive hypertrophic cardiomyopathy: Secondary | ICD-10-CM | POA: Diagnosis not present

## 2020-09-03 DIAGNOSIS — I509 Heart failure, unspecified: Secondary | ICD-10-CM | POA: Diagnosis not present

## 2020-09-03 DIAGNOSIS — F0390 Unspecified dementia without behavioral disturbance: Secondary | ICD-10-CM | POA: Diagnosis not present

## 2020-09-03 DIAGNOSIS — I130 Hypertensive heart and chronic kidney disease with heart failure and stage 1 through stage 4 chronic kidney disease, or unspecified chronic kidney disease: Secondary | ICD-10-CM | POA: Diagnosis not present

## 2020-09-03 DIAGNOSIS — I421 Obstructive hypertrophic cardiomyopathy: Secondary | ICD-10-CM | POA: Diagnosis not present

## 2020-09-03 DIAGNOSIS — N183 Chronic kidney disease, stage 3 unspecified: Secondary | ICD-10-CM | POA: Diagnosis not present

## 2020-09-03 DIAGNOSIS — D631 Anemia in chronic kidney disease: Secondary | ICD-10-CM | POA: Diagnosis not present

## 2020-09-04 DIAGNOSIS — R32 Unspecified urinary incontinence: Secondary | ICD-10-CM | POA: Diagnosis not present

## 2020-09-04 DIAGNOSIS — D631 Anemia in chronic kidney disease: Secondary | ICD-10-CM | POA: Diagnosis not present

## 2020-09-04 DIAGNOSIS — I421 Obstructive hypertrophic cardiomyopathy: Secondary | ICD-10-CM | POA: Diagnosis not present

## 2020-09-04 DIAGNOSIS — Z6821 Body mass index (BMI) 21.0-21.9, adult: Secondary | ICD-10-CM | POA: Diagnosis not present

## 2020-09-04 DIAGNOSIS — Z993 Dependence on wheelchair: Secondary | ICD-10-CM | POA: Diagnosis not present

## 2020-09-04 DIAGNOSIS — N183 Chronic kidney disease, stage 3 unspecified: Secondary | ICD-10-CM | POA: Diagnosis not present

## 2020-09-04 DIAGNOSIS — Z87440 Personal history of urinary (tract) infections: Secondary | ICD-10-CM | POA: Diagnosis not present

## 2020-09-04 DIAGNOSIS — I509 Heart failure, unspecified: Secondary | ICD-10-CM | POA: Diagnosis not present

## 2020-09-04 DIAGNOSIS — I130 Hypertensive heart and chronic kidney disease with heart failure and stage 1 through stage 4 chronic kidney disease, or unspecified chronic kidney disease: Secondary | ICD-10-CM | POA: Diagnosis not present

## 2020-09-04 DIAGNOSIS — R627 Adult failure to thrive: Secondary | ICD-10-CM | POA: Diagnosis not present

## 2020-09-04 DIAGNOSIS — F0390 Unspecified dementia without behavioral disturbance: Secondary | ICD-10-CM | POA: Diagnosis not present

## 2020-09-04 DIAGNOSIS — N952 Postmenopausal atrophic vaginitis: Secondary | ICD-10-CM | POA: Diagnosis not present

## 2020-09-04 DIAGNOSIS — R159 Full incontinence of feces: Secondary | ICD-10-CM | POA: Diagnosis not present

## 2020-09-04 DIAGNOSIS — I700 Atherosclerosis of aorta: Secondary | ICD-10-CM | POA: Diagnosis not present

## 2020-09-06 DIAGNOSIS — N183 Chronic kidney disease, stage 3 unspecified: Secondary | ICD-10-CM | POA: Diagnosis not present

## 2020-09-06 DIAGNOSIS — I421 Obstructive hypertrophic cardiomyopathy: Secondary | ICD-10-CM | POA: Diagnosis not present

## 2020-09-06 DIAGNOSIS — F0390 Unspecified dementia without behavioral disturbance: Secondary | ICD-10-CM | POA: Diagnosis not present

## 2020-09-06 DIAGNOSIS — D631 Anemia in chronic kidney disease: Secondary | ICD-10-CM | POA: Diagnosis not present

## 2020-09-06 DIAGNOSIS — I130 Hypertensive heart and chronic kidney disease with heart failure and stage 1 through stage 4 chronic kidney disease, or unspecified chronic kidney disease: Secondary | ICD-10-CM | POA: Diagnosis not present

## 2020-09-06 DIAGNOSIS — I509 Heart failure, unspecified: Secondary | ICD-10-CM | POA: Diagnosis not present

## 2020-09-07 DIAGNOSIS — I130 Hypertensive heart and chronic kidney disease with heart failure and stage 1 through stage 4 chronic kidney disease, or unspecified chronic kidney disease: Secondary | ICD-10-CM | POA: Diagnosis not present

## 2020-09-07 DIAGNOSIS — I421 Obstructive hypertrophic cardiomyopathy: Secondary | ICD-10-CM | POA: Diagnosis not present

## 2020-09-07 DIAGNOSIS — N183 Chronic kidney disease, stage 3 unspecified: Secondary | ICD-10-CM | POA: Diagnosis not present

## 2020-09-07 DIAGNOSIS — F0390 Unspecified dementia without behavioral disturbance: Secondary | ICD-10-CM | POA: Diagnosis not present

## 2020-09-07 DIAGNOSIS — I509 Heart failure, unspecified: Secondary | ICD-10-CM | POA: Diagnosis not present

## 2020-09-07 DIAGNOSIS — N390 Urinary tract infection, site not specified: Secondary | ICD-10-CM | POA: Diagnosis not present

## 2020-09-07 DIAGNOSIS — R8290 Unspecified abnormal findings in urine: Secondary | ICD-10-CM | POA: Diagnosis not present

## 2020-09-07 DIAGNOSIS — G479 Sleep disorder, unspecified: Secondary | ICD-10-CM | POA: Diagnosis not present

## 2020-09-07 DIAGNOSIS — D631 Anemia in chronic kidney disease: Secondary | ICD-10-CM | POA: Diagnosis not present

## 2020-09-09 DIAGNOSIS — N183 Chronic kidney disease, stage 3 unspecified: Secondary | ICD-10-CM | POA: Diagnosis not present

## 2020-09-09 DIAGNOSIS — D631 Anemia in chronic kidney disease: Secondary | ICD-10-CM | POA: Diagnosis not present

## 2020-09-09 DIAGNOSIS — I130 Hypertensive heart and chronic kidney disease with heart failure and stage 1 through stage 4 chronic kidney disease, or unspecified chronic kidney disease: Secondary | ICD-10-CM | POA: Diagnosis not present

## 2020-09-09 DIAGNOSIS — I421 Obstructive hypertrophic cardiomyopathy: Secondary | ICD-10-CM | POA: Diagnosis not present

## 2020-09-09 DIAGNOSIS — F0390 Unspecified dementia without behavioral disturbance: Secondary | ICD-10-CM | POA: Diagnosis not present

## 2020-09-09 DIAGNOSIS — I509 Heart failure, unspecified: Secondary | ICD-10-CM | POA: Diagnosis not present

## 2020-09-10 DIAGNOSIS — I421 Obstructive hypertrophic cardiomyopathy: Secondary | ICD-10-CM | POA: Diagnosis not present

## 2020-09-10 DIAGNOSIS — I509 Heart failure, unspecified: Secondary | ICD-10-CM | POA: Diagnosis not present

## 2020-09-10 DIAGNOSIS — F0390 Unspecified dementia without behavioral disturbance: Secondary | ICD-10-CM | POA: Diagnosis not present

## 2020-09-10 DIAGNOSIS — D631 Anemia in chronic kidney disease: Secondary | ICD-10-CM | POA: Diagnosis not present

## 2020-09-10 DIAGNOSIS — N183 Chronic kidney disease, stage 3 unspecified: Secondary | ICD-10-CM | POA: Diagnosis not present

## 2020-09-10 DIAGNOSIS — I130 Hypertensive heart and chronic kidney disease with heart failure and stage 1 through stage 4 chronic kidney disease, or unspecified chronic kidney disease: Secondary | ICD-10-CM | POA: Diagnosis not present

## 2020-09-13 DIAGNOSIS — I421 Obstructive hypertrophic cardiomyopathy: Secondary | ICD-10-CM | POA: Diagnosis not present

## 2020-09-13 DIAGNOSIS — D631 Anemia in chronic kidney disease: Secondary | ICD-10-CM | POA: Diagnosis not present

## 2020-09-13 DIAGNOSIS — I130 Hypertensive heart and chronic kidney disease with heart failure and stage 1 through stage 4 chronic kidney disease, or unspecified chronic kidney disease: Secondary | ICD-10-CM | POA: Diagnosis not present

## 2020-09-13 DIAGNOSIS — R451 Restlessness and agitation: Secondary | ICD-10-CM | POA: Diagnosis not present

## 2020-09-13 DIAGNOSIS — I509 Heart failure, unspecified: Secondary | ICD-10-CM | POA: Diagnosis not present

## 2020-09-13 DIAGNOSIS — F0390 Unspecified dementia without behavioral disturbance: Secondary | ICD-10-CM | POA: Diagnosis not present

## 2020-09-13 DIAGNOSIS — N183 Chronic kidney disease, stage 3 unspecified: Secondary | ICD-10-CM | POA: Diagnosis not present

## 2020-09-13 DIAGNOSIS — N390 Urinary tract infection, site not specified: Secondary | ICD-10-CM | POA: Diagnosis not present

## 2020-09-13 DIAGNOSIS — S81802A Unspecified open wound, left lower leg, initial encounter: Secondary | ICD-10-CM | POA: Diagnosis not present

## 2020-09-14 DIAGNOSIS — D631 Anemia in chronic kidney disease: Secondary | ICD-10-CM | POA: Diagnosis not present

## 2020-09-14 DIAGNOSIS — I421 Obstructive hypertrophic cardiomyopathy: Secondary | ICD-10-CM | POA: Diagnosis not present

## 2020-09-14 DIAGNOSIS — I509 Heart failure, unspecified: Secondary | ICD-10-CM | POA: Diagnosis not present

## 2020-09-14 DIAGNOSIS — N183 Chronic kidney disease, stage 3 unspecified: Secondary | ICD-10-CM | POA: Diagnosis not present

## 2020-09-14 DIAGNOSIS — F0390 Unspecified dementia without behavioral disturbance: Secondary | ICD-10-CM | POA: Diagnosis not present

## 2020-09-14 DIAGNOSIS — I130 Hypertensive heart and chronic kidney disease with heart failure and stage 1 through stage 4 chronic kidney disease, or unspecified chronic kidney disease: Secondary | ICD-10-CM | POA: Diagnosis not present

## 2020-09-16 DIAGNOSIS — D631 Anemia in chronic kidney disease: Secondary | ICD-10-CM | POA: Diagnosis not present

## 2020-09-16 DIAGNOSIS — I130 Hypertensive heart and chronic kidney disease with heart failure and stage 1 through stage 4 chronic kidney disease, or unspecified chronic kidney disease: Secondary | ICD-10-CM | POA: Diagnosis not present

## 2020-09-16 DIAGNOSIS — F0390 Unspecified dementia without behavioral disturbance: Secondary | ICD-10-CM | POA: Diagnosis not present

## 2020-09-16 DIAGNOSIS — I421 Obstructive hypertrophic cardiomyopathy: Secondary | ICD-10-CM | POA: Diagnosis not present

## 2020-09-16 DIAGNOSIS — N183 Chronic kidney disease, stage 3 unspecified: Secondary | ICD-10-CM | POA: Diagnosis not present

## 2020-09-16 DIAGNOSIS — I509 Heart failure, unspecified: Secondary | ICD-10-CM | POA: Diagnosis not present

## 2020-09-17 DIAGNOSIS — I421 Obstructive hypertrophic cardiomyopathy: Secondary | ICD-10-CM | POA: Diagnosis not present

## 2020-09-17 DIAGNOSIS — I130 Hypertensive heart and chronic kidney disease with heart failure and stage 1 through stage 4 chronic kidney disease, or unspecified chronic kidney disease: Secondary | ICD-10-CM | POA: Diagnosis not present

## 2020-09-17 DIAGNOSIS — D631 Anemia in chronic kidney disease: Secondary | ICD-10-CM | POA: Diagnosis not present

## 2020-09-17 DIAGNOSIS — I509 Heart failure, unspecified: Secondary | ICD-10-CM | POA: Diagnosis not present

## 2020-09-17 DIAGNOSIS — N183 Chronic kidney disease, stage 3 unspecified: Secondary | ICD-10-CM | POA: Diagnosis not present

## 2020-09-17 DIAGNOSIS — F0390 Unspecified dementia without behavioral disturbance: Secondary | ICD-10-CM | POA: Diagnosis not present

## 2020-09-21 DIAGNOSIS — I130 Hypertensive heart and chronic kidney disease with heart failure and stage 1 through stage 4 chronic kidney disease, or unspecified chronic kidney disease: Secondary | ICD-10-CM | POA: Diagnosis not present

## 2020-09-21 DIAGNOSIS — I509 Heart failure, unspecified: Secondary | ICD-10-CM | POA: Diagnosis not present

## 2020-09-21 DIAGNOSIS — I421 Obstructive hypertrophic cardiomyopathy: Secondary | ICD-10-CM | POA: Diagnosis not present

## 2020-09-21 DIAGNOSIS — N183 Chronic kidney disease, stage 3 unspecified: Secondary | ICD-10-CM | POA: Diagnosis not present

## 2020-09-21 DIAGNOSIS — D631 Anemia in chronic kidney disease: Secondary | ICD-10-CM | POA: Diagnosis not present

## 2020-09-21 DIAGNOSIS — F0390 Unspecified dementia without behavioral disturbance: Secondary | ICD-10-CM | POA: Diagnosis not present

## 2020-09-23 DIAGNOSIS — I130 Hypertensive heart and chronic kidney disease with heart failure and stage 1 through stage 4 chronic kidney disease, or unspecified chronic kidney disease: Secondary | ICD-10-CM | POA: Diagnosis not present

## 2020-09-23 DIAGNOSIS — I421 Obstructive hypertrophic cardiomyopathy: Secondary | ICD-10-CM | POA: Diagnosis not present

## 2020-09-23 DIAGNOSIS — I509 Heart failure, unspecified: Secondary | ICD-10-CM | POA: Diagnosis not present

## 2020-09-23 DIAGNOSIS — N183 Chronic kidney disease, stage 3 unspecified: Secondary | ICD-10-CM | POA: Diagnosis not present

## 2020-09-23 DIAGNOSIS — D631 Anemia in chronic kidney disease: Secondary | ICD-10-CM | POA: Diagnosis not present

## 2020-09-23 DIAGNOSIS — F0390 Unspecified dementia without behavioral disturbance: Secondary | ICD-10-CM | POA: Diagnosis not present

## 2020-09-24 DIAGNOSIS — N183 Chronic kidney disease, stage 3 unspecified: Secondary | ICD-10-CM | POA: Diagnosis not present

## 2020-09-24 DIAGNOSIS — F0390 Unspecified dementia without behavioral disturbance: Secondary | ICD-10-CM | POA: Diagnosis not present

## 2020-09-24 DIAGNOSIS — I509 Heart failure, unspecified: Secondary | ICD-10-CM | POA: Diagnosis not present

## 2020-09-24 DIAGNOSIS — I130 Hypertensive heart and chronic kidney disease with heart failure and stage 1 through stage 4 chronic kidney disease, or unspecified chronic kidney disease: Secondary | ICD-10-CM | POA: Diagnosis not present

## 2020-09-24 DIAGNOSIS — I421 Obstructive hypertrophic cardiomyopathy: Secondary | ICD-10-CM | POA: Diagnosis not present

## 2020-09-24 DIAGNOSIS — D631 Anemia in chronic kidney disease: Secondary | ICD-10-CM | POA: Diagnosis not present

## 2020-09-28 DIAGNOSIS — F0390 Unspecified dementia without behavioral disturbance: Secondary | ICD-10-CM | POA: Diagnosis not present

## 2020-09-28 DIAGNOSIS — D631 Anemia in chronic kidney disease: Secondary | ICD-10-CM | POA: Diagnosis not present

## 2020-09-28 DIAGNOSIS — I509 Heart failure, unspecified: Secondary | ICD-10-CM | POA: Diagnosis not present

## 2020-09-28 DIAGNOSIS — N183 Chronic kidney disease, stage 3 unspecified: Secondary | ICD-10-CM | POA: Diagnosis not present

## 2020-09-28 DIAGNOSIS — I421 Obstructive hypertrophic cardiomyopathy: Secondary | ICD-10-CM | POA: Diagnosis not present

## 2020-09-28 DIAGNOSIS — I130 Hypertensive heart and chronic kidney disease with heart failure and stage 1 through stage 4 chronic kidney disease, or unspecified chronic kidney disease: Secondary | ICD-10-CM | POA: Diagnosis not present

## 2020-09-30 DIAGNOSIS — F0390 Unspecified dementia without behavioral disturbance: Secondary | ICD-10-CM | POA: Diagnosis not present

## 2020-09-30 DIAGNOSIS — I130 Hypertensive heart and chronic kidney disease with heart failure and stage 1 through stage 4 chronic kidney disease, or unspecified chronic kidney disease: Secondary | ICD-10-CM | POA: Diagnosis not present

## 2020-09-30 DIAGNOSIS — I509 Heart failure, unspecified: Secondary | ICD-10-CM | POA: Diagnosis not present

## 2020-09-30 DIAGNOSIS — I421 Obstructive hypertrophic cardiomyopathy: Secondary | ICD-10-CM | POA: Diagnosis not present

## 2020-09-30 DIAGNOSIS — N183 Chronic kidney disease, stage 3 unspecified: Secondary | ICD-10-CM | POA: Diagnosis not present

## 2020-09-30 DIAGNOSIS — D631 Anemia in chronic kidney disease: Secondary | ICD-10-CM | POA: Diagnosis not present

## 2020-10-01 DIAGNOSIS — D631 Anemia in chronic kidney disease: Secondary | ICD-10-CM | POA: Diagnosis not present

## 2020-10-01 DIAGNOSIS — F0390 Unspecified dementia without behavioral disturbance: Secondary | ICD-10-CM | POA: Diagnosis not present

## 2020-10-01 DIAGNOSIS — I509 Heart failure, unspecified: Secondary | ICD-10-CM | POA: Diagnosis not present

## 2020-10-01 DIAGNOSIS — I130 Hypertensive heart and chronic kidney disease with heart failure and stage 1 through stage 4 chronic kidney disease, or unspecified chronic kidney disease: Secondary | ICD-10-CM | POA: Diagnosis not present

## 2020-10-01 DIAGNOSIS — N183 Chronic kidney disease, stage 3 unspecified: Secondary | ICD-10-CM | POA: Diagnosis not present

## 2020-10-01 DIAGNOSIS — I421 Obstructive hypertrophic cardiomyopathy: Secondary | ICD-10-CM | POA: Diagnosis not present

## 2020-10-04 DIAGNOSIS — D631 Anemia in chronic kidney disease: Secondary | ICD-10-CM | POA: Diagnosis not present

## 2020-10-04 DIAGNOSIS — I421 Obstructive hypertrophic cardiomyopathy: Secondary | ICD-10-CM | POA: Diagnosis not present

## 2020-10-04 DIAGNOSIS — F0390 Unspecified dementia without behavioral disturbance: Secondary | ICD-10-CM | POA: Diagnosis not present

## 2020-10-04 DIAGNOSIS — N183 Chronic kidney disease, stage 3 unspecified: Secondary | ICD-10-CM | POA: Diagnosis not present

## 2020-10-04 DIAGNOSIS — I509 Heart failure, unspecified: Secondary | ICD-10-CM | POA: Diagnosis not present

## 2020-10-04 DIAGNOSIS — I130 Hypertensive heart and chronic kidney disease with heart failure and stage 1 through stage 4 chronic kidney disease, or unspecified chronic kidney disease: Secondary | ICD-10-CM | POA: Diagnosis not present

## 2020-10-05 DIAGNOSIS — Z6821 Body mass index (BMI) 21.0-21.9, adult: Secondary | ICD-10-CM | POA: Diagnosis not present

## 2020-10-05 DIAGNOSIS — R627 Adult failure to thrive: Secondary | ICD-10-CM | POA: Diagnosis not present

## 2020-10-05 DIAGNOSIS — I421 Obstructive hypertrophic cardiomyopathy: Secondary | ICD-10-CM | POA: Diagnosis not present

## 2020-10-05 DIAGNOSIS — N952 Postmenopausal atrophic vaginitis: Secondary | ICD-10-CM | POA: Diagnosis not present

## 2020-10-05 DIAGNOSIS — F0390 Unspecified dementia without behavioral disturbance: Secondary | ICD-10-CM | POA: Diagnosis not present

## 2020-10-05 DIAGNOSIS — Z87440 Personal history of urinary (tract) infections: Secondary | ICD-10-CM | POA: Diagnosis not present

## 2020-10-05 DIAGNOSIS — I509 Heart failure, unspecified: Secondary | ICD-10-CM | POA: Diagnosis not present

## 2020-10-05 DIAGNOSIS — I700 Atherosclerosis of aorta: Secondary | ICD-10-CM | POA: Diagnosis not present

## 2020-10-05 DIAGNOSIS — D631 Anemia in chronic kidney disease: Secondary | ICD-10-CM | POA: Diagnosis not present

## 2020-10-05 DIAGNOSIS — Z993 Dependence on wheelchair: Secondary | ICD-10-CM | POA: Diagnosis not present

## 2020-10-05 DIAGNOSIS — R159 Full incontinence of feces: Secondary | ICD-10-CM | POA: Diagnosis not present

## 2020-10-05 DIAGNOSIS — N183 Chronic kidney disease, stage 3 unspecified: Secondary | ICD-10-CM | POA: Diagnosis not present

## 2020-10-05 DIAGNOSIS — R32 Unspecified urinary incontinence: Secondary | ICD-10-CM | POA: Diagnosis not present

## 2020-10-05 DIAGNOSIS — N390 Urinary tract infection, site not specified: Secondary | ICD-10-CM | POA: Diagnosis not present

## 2020-10-05 DIAGNOSIS — I130 Hypertensive heart and chronic kidney disease with heart failure and stage 1 through stage 4 chronic kidney disease, or unspecified chronic kidney disease: Secondary | ICD-10-CM | POA: Diagnosis not present

## 2020-10-06 DIAGNOSIS — I509 Heart failure, unspecified: Secondary | ICD-10-CM | POA: Diagnosis not present

## 2020-10-06 DIAGNOSIS — N952 Postmenopausal atrophic vaginitis: Secondary | ICD-10-CM | POA: Diagnosis not present

## 2020-10-06 DIAGNOSIS — N183 Chronic kidney disease, stage 3 unspecified: Secondary | ICD-10-CM | POA: Diagnosis not present

## 2020-10-06 DIAGNOSIS — I421 Obstructive hypertrophic cardiomyopathy: Secondary | ICD-10-CM | POA: Diagnosis not present

## 2020-10-06 DIAGNOSIS — I130 Hypertensive heart and chronic kidney disease with heart failure and stage 1 through stage 4 chronic kidney disease, or unspecified chronic kidney disease: Secondary | ICD-10-CM | POA: Diagnosis not present

## 2020-10-06 DIAGNOSIS — N390 Urinary tract infection, site not specified: Secondary | ICD-10-CM | POA: Diagnosis not present

## 2020-10-06 DIAGNOSIS — F0390 Unspecified dementia without behavioral disturbance: Secondary | ICD-10-CM | POA: Diagnosis not present

## 2020-10-06 DIAGNOSIS — D631 Anemia in chronic kidney disease: Secondary | ICD-10-CM | POA: Diagnosis not present

## 2020-10-07 DIAGNOSIS — I421 Obstructive hypertrophic cardiomyopathy: Secondary | ICD-10-CM | POA: Diagnosis not present

## 2020-10-07 DIAGNOSIS — F0390 Unspecified dementia without behavioral disturbance: Secondary | ICD-10-CM | POA: Diagnosis not present

## 2020-10-07 DIAGNOSIS — I509 Heart failure, unspecified: Secondary | ICD-10-CM | POA: Diagnosis not present

## 2020-10-07 DIAGNOSIS — D631 Anemia in chronic kidney disease: Secondary | ICD-10-CM | POA: Diagnosis not present

## 2020-10-07 DIAGNOSIS — I130 Hypertensive heart and chronic kidney disease with heart failure and stage 1 through stage 4 chronic kidney disease, or unspecified chronic kidney disease: Secondary | ICD-10-CM | POA: Diagnosis not present

## 2020-10-07 DIAGNOSIS — N183 Chronic kidney disease, stage 3 unspecified: Secondary | ICD-10-CM | POA: Diagnosis not present

## 2020-10-08 DIAGNOSIS — F0390 Unspecified dementia without behavioral disturbance: Secondary | ICD-10-CM | POA: Diagnosis not present

## 2020-10-08 DIAGNOSIS — N183 Chronic kidney disease, stage 3 unspecified: Secondary | ICD-10-CM | POA: Diagnosis not present

## 2020-10-08 DIAGNOSIS — D631 Anemia in chronic kidney disease: Secondary | ICD-10-CM | POA: Diagnosis not present

## 2020-10-08 DIAGNOSIS — I421 Obstructive hypertrophic cardiomyopathy: Secondary | ICD-10-CM | POA: Diagnosis not present

## 2020-10-08 DIAGNOSIS — I509 Heart failure, unspecified: Secondary | ICD-10-CM | POA: Diagnosis not present

## 2020-10-08 DIAGNOSIS — I130 Hypertensive heart and chronic kidney disease with heart failure and stage 1 through stage 4 chronic kidney disease, or unspecified chronic kidney disease: Secondary | ICD-10-CM | POA: Diagnosis not present

## 2020-10-12 DIAGNOSIS — I509 Heart failure, unspecified: Secondary | ICD-10-CM | POA: Diagnosis not present

## 2020-10-12 DIAGNOSIS — F0390 Unspecified dementia without behavioral disturbance: Secondary | ICD-10-CM | POA: Diagnosis not present

## 2020-10-12 DIAGNOSIS — I421 Obstructive hypertrophic cardiomyopathy: Secondary | ICD-10-CM | POA: Diagnosis not present

## 2020-10-12 DIAGNOSIS — I130 Hypertensive heart and chronic kidney disease with heart failure and stage 1 through stage 4 chronic kidney disease, or unspecified chronic kidney disease: Secondary | ICD-10-CM | POA: Diagnosis not present

## 2020-10-12 DIAGNOSIS — D631 Anemia in chronic kidney disease: Secondary | ICD-10-CM | POA: Diagnosis not present

## 2020-10-12 DIAGNOSIS — N183 Chronic kidney disease, stage 3 unspecified: Secondary | ICD-10-CM | POA: Diagnosis not present

## 2020-10-14 DIAGNOSIS — I509 Heart failure, unspecified: Secondary | ICD-10-CM | POA: Diagnosis not present

## 2020-10-14 DIAGNOSIS — F0390 Unspecified dementia without behavioral disturbance: Secondary | ICD-10-CM | POA: Diagnosis not present

## 2020-10-14 DIAGNOSIS — N183 Chronic kidney disease, stage 3 unspecified: Secondary | ICD-10-CM | POA: Diagnosis not present

## 2020-10-14 DIAGNOSIS — D631 Anemia in chronic kidney disease: Secondary | ICD-10-CM | POA: Diagnosis not present

## 2020-10-14 DIAGNOSIS — I130 Hypertensive heart and chronic kidney disease with heart failure and stage 1 through stage 4 chronic kidney disease, or unspecified chronic kidney disease: Secondary | ICD-10-CM | POA: Diagnosis not present

## 2020-10-14 DIAGNOSIS — I421 Obstructive hypertrophic cardiomyopathy: Secondary | ICD-10-CM | POA: Diagnosis not present

## 2020-10-15 DIAGNOSIS — I421 Obstructive hypertrophic cardiomyopathy: Secondary | ICD-10-CM | POA: Diagnosis not present

## 2020-10-15 DIAGNOSIS — I509 Heart failure, unspecified: Secondary | ICD-10-CM | POA: Diagnosis not present

## 2020-10-15 DIAGNOSIS — F0390 Unspecified dementia without behavioral disturbance: Secondary | ICD-10-CM | POA: Diagnosis not present

## 2020-10-15 DIAGNOSIS — N183 Chronic kidney disease, stage 3 unspecified: Secondary | ICD-10-CM | POA: Diagnosis not present

## 2020-10-15 DIAGNOSIS — D631 Anemia in chronic kidney disease: Secondary | ICD-10-CM | POA: Diagnosis not present

## 2020-10-15 DIAGNOSIS — I130 Hypertensive heart and chronic kidney disease with heart failure and stage 1 through stage 4 chronic kidney disease, or unspecified chronic kidney disease: Secondary | ICD-10-CM | POA: Diagnosis not present

## 2020-10-18 DIAGNOSIS — N183 Chronic kidney disease, stage 3 unspecified: Secondary | ICD-10-CM | POA: Diagnosis not present

## 2020-10-18 DIAGNOSIS — D631 Anemia in chronic kidney disease: Secondary | ICD-10-CM | POA: Diagnosis not present

## 2020-10-18 DIAGNOSIS — I509 Heart failure, unspecified: Secondary | ICD-10-CM | POA: Diagnosis not present

## 2020-10-18 DIAGNOSIS — I421 Obstructive hypertrophic cardiomyopathy: Secondary | ICD-10-CM | POA: Diagnosis not present

## 2020-10-18 DIAGNOSIS — I130 Hypertensive heart and chronic kidney disease with heart failure and stage 1 through stage 4 chronic kidney disease, or unspecified chronic kidney disease: Secondary | ICD-10-CM | POA: Diagnosis not present

## 2020-10-18 DIAGNOSIS — F0390 Unspecified dementia without behavioral disturbance: Secondary | ICD-10-CM | POA: Diagnosis not present

## 2020-10-19 DIAGNOSIS — I421 Obstructive hypertrophic cardiomyopathy: Secondary | ICD-10-CM | POA: Diagnosis not present

## 2020-10-19 DIAGNOSIS — I509 Heart failure, unspecified: Secondary | ICD-10-CM | POA: Diagnosis not present

## 2020-10-19 DIAGNOSIS — F0390 Unspecified dementia without behavioral disturbance: Secondary | ICD-10-CM | POA: Diagnosis not present

## 2020-10-19 DIAGNOSIS — I130 Hypertensive heart and chronic kidney disease with heart failure and stage 1 through stage 4 chronic kidney disease, or unspecified chronic kidney disease: Secondary | ICD-10-CM | POA: Diagnosis not present

## 2020-10-19 DIAGNOSIS — N183 Chronic kidney disease, stage 3 unspecified: Secondary | ICD-10-CM | POA: Diagnosis not present

## 2020-10-19 DIAGNOSIS — D631 Anemia in chronic kidney disease: Secondary | ICD-10-CM | POA: Diagnosis not present

## 2020-10-21 DIAGNOSIS — I509 Heart failure, unspecified: Secondary | ICD-10-CM | POA: Diagnosis not present

## 2020-10-21 DIAGNOSIS — N183 Chronic kidney disease, stage 3 unspecified: Secondary | ICD-10-CM | POA: Diagnosis not present

## 2020-10-21 DIAGNOSIS — I421 Obstructive hypertrophic cardiomyopathy: Secondary | ICD-10-CM | POA: Diagnosis not present

## 2020-10-21 DIAGNOSIS — F0390 Unspecified dementia without behavioral disturbance: Secondary | ICD-10-CM | POA: Diagnosis not present

## 2020-10-21 DIAGNOSIS — D631 Anemia in chronic kidney disease: Secondary | ICD-10-CM | POA: Diagnosis not present

## 2020-10-21 DIAGNOSIS — I130 Hypertensive heart and chronic kidney disease with heart failure and stage 1 through stage 4 chronic kidney disease, or unspecified chronic kidney disease: Secondary | ICD-10-CM | POA: Diagnosis not present

## 2020-10-26 DIAGNOSIS — D631 Anemia in chronic kidney disease: Secondary | ICD-10-CM | POA: Diagnosis not present

## 2020-10-26 DIAGNOSIS — I421 Obstructive hypertrophic cardiomyopathy: Secondary | ICD-10-CM | POA: Diagnosis not present

## 2020-10-26 DIAGNOSIS — N183 Chronic kidney disease, stage 3 unspecified: Secondary | ICD-10-CM | POA: Diagnosis not present

## 2020-10-26 DIAGNOSIS — I130 Hypertensive heart and chronic kidney disease with heart failure and stage 1 through stage 4 chronic kidney disease, or unspecified chronic kidney disease: Secondary | ICD-10-CM | POA: Diagnosis not present

## 2020-10-26 DIAGNOSIS — I509 Heart failure, unspecified: Secondary | ICD-10-CM | POA: Diagnosis not present

## 2020-10-26 DIAGNOSIS — F0390 Unspecified dementia without behavioral disturbance: Secondary | ICD-10-CM | POA: Diagnosis not present

## 2020-10-28 DIAGNOSIS — I421 Obstructive hypertrophic cardiomyopathy: Secondary | ICD-10-CM | POA: Diagnosis not present

## 2020-10-28 DIAGNOSIS — I130 Hypertensive heart and chronic kidney disease with heart failure and stage 1 through stage 4 chronic kidney disease, or unspecified chronic kidney disease: Secondary | ICD-10-CM | POA: Diagnosis not present

## 2020-10-28 DIAGNOSIS — D631 Anemia in chronic kidney disease: Secondary | ICD-10-CM | POA: Diagnosis not present

## 2020-10-28 DIAGNOSIS — I509 Heart failure, unspecified: Secondary | ICD-10-CM | POA: Diagnosis not present

## 2020-10-28 DIAGNOSIS — N183 Chronic kidney disease, stage 3 unspecified: Secondary | ICD-10-CM | POA: Diagnosis not present

## 2020-10-28 DIAGNOSIS — F0390 Unspecified dementia without behavioral disturbance: Secondary | ICD-10-CM | POA: Diagnosis not present

## 2020-11-01 DIAGNOSIS — D631 Anemia in chronic kidney disease: Secondary | ICD-10-CM | POA: Diagnosis not present

## 2020-11-01 DIAGNOSIS — N390 Urinary tract infection, site not specified: Secondary | ICD-10-CM | POA: Diagnosis not present

## 2020-11-01 DIAGNOSIS — I130 Hypertensive heart and chronic kidney disease with heart failure and stage 1 through stage 4 chronic kidney disease, or unspecified chronic kidney disease: Secondary | ICD-10-CM | POA: Diagnosis not present

## 2020-11-01 DIAGNOSIS — N183 Chronic kidney disease, stage 3 unspecified: Secondary | ICD-10-CM | POA: Diagnosis not present

## 2020-11-01 DIAGNOSIS — F0390 Unspecified dementia without behavioral disturbance: Secondary | ICD-10-CM | POA: Diagnosis not present

## 2020-11-01 DIAGNOSIS — I509 Heart failure, unspecified: Secondary | ICD-10-CM | POA: Diagnosis not present

## 2020-11-01 DIAGNOSIS — I421 Obstructive hypertrophic cardiomyopathy: Secondary | ICD-10-CM | POA: Diagnosis not present

## 2020-11-02 DIAGNOSIS — Z6821 Body mass index (BMI) 21.0-21.9, adult: Secondary | ICD-10-CM | POA: Diagnosis not present

## 2020-11-02 DIAGNOSIS — F0390 Unspecified dementia without behavioral disturbance: Secondary | ICD-10-CM | POA: Diagnosis not present

## 2020-11-02 DIAGNOSIS — I421 Obstructive hypertrophic cardiomyopathy: Secondary | ICD-10-CM | POA: Diagnosis not present

## 2020-11-02 DIAGNOSIS — N183 Chronic kidney disease, stage 3 unspecified: Secondary | ICD-10-CM | POA: Diagnosis not present

## 2020-11-02 DIAGNOSIS — I130 Hypertensive heart and chronic kidney disease with heart failure and stage 1 through stage 4 chronic kidney disease, or unspecified chronic kidney disease: Secondary | ICD-10-CM | POA: Diagnosis not present

## 2020-11-02 DIAGNOSIS — I700 Atherosclerosis of aorta: Secondary | ICD-10-CM | POA: Diagnosis not present

## 2020-11-02 DIAGNOSIS — I509 Heart failure, unspecified: Secondary | ICD-10-CM | POA: Diagnosis not present

## 2020-11-02 DIAGNOSIS — R32 Unspecified urinary incontinence: Secondary | ICD-10-CM | POA: Diagnosis not present

## 2020-11-02 DIAGNOSIS — R627 Adult failure to thrive: Secondary | ICD-10-CM | POA: Diagnosis not present

## 2020-11-02 DIAGNOSIS — N952 Postmenopausal atrophic vaginitis: Secondary | ICD-10-CM | POA: Diagnosis not present

## 2020-11-02 DIAGNOSIS — Z993 Dependence on wheelchair: Secondary | ICD-10-CM | POA: Diagnosis not present

## 2020-11-02 DIAGNOSIS — Z87440 Personal history of urinary (tract) infections: Secondary | ICD-10-CM | POA: Diagnosis not present

## 2020-11-02 DIAGNOSIS — R159 Full incontinence of feces: Secondary | ICD-10-CM | POA: Diagnosis not present

## 2020-11-02 DIAGNOSIS — D631 Anemia in chronic kidney disease: Secondary | ICD-10-CM | POA: Diagnosis not present

## 2020-11-04 DIAGNOSIS — I509 Heart failure, unspecified: Secondary | ICD-10-CM | POA: Diagnosis not present

## 2020-11-04 DIAGNOSIS — I130 Hypertensive heart and chronic kidney disease with heart failure and stage 1 through stage 4 chronic kidney disease, or unspecified chronic kidney disease: Secondary | ICD-10-CM | POA: Diagnosis not present

## 2020-11-04 DIAGNOSIS — F0390 Unspecified dementia without behavioral disturbance: Secondary | ICD-10-CM | POA: Diagnosis not present

## 2020-11-04 DIAGNOSIS — N183 Chronic kidney disease, stage 3 unspecified: Secondary | ICD-10-CM | POA: Diagnosis not present

## 2020-11-04 DIAGNOSIS — I421 Obstructive hypertrophic cardiomyopathy: Secondary | ICD-10-CM | POA: Diagnosis not present

## 2020-11-04 DIAGNOSIS — D631 Anemia in chronic kidney disease: Secondary | ICD-10-CM | POA: Diagnosis not present

## 2020-11-05 DIAGNOSIS — I421 Obstructive hypertrophic cardiomyopathy: Secondary | ICD-10-CM | POA: Diagnosis not present

## 2020-11-05 DIAGNOSIS — N183 Chronic kidney disease, stage 3 unspecified: Secondary | ICD-10-CM | POA: Diagnosis not present

## 2020-11-05 DIAGNOSIS — I509 Heart failure, unspecified: Secondary | ICD-10-CM | POA: Diagnosis not present

## 2020-11-05 DIAGNOSIS — D631 Anemia in chronic kidney disease: Secondary | ICD-10-CM | POA: Diagnosis not present

## 2020-11-05 DIAGNOSIS — F0390 Unspecified dementia without behavioral disturbance: Secondary | ICD-10-CM | POA: Diagnosis not present

## 2020-11-05 DIAGNOSIS — I130 Hypertensive heart and chronic kidney disease with heart failure and stage 1 through stage 4 chronic kidney disease, or unspecified chronic kidney disease: Secondary | ICD-10-CM | POA: Diagnosis not present

## 2020-11-06 DIAGNOSIS — F0390 Unspecified dementia without behavioral disturbance: Secondary | ICD-10-CM | POA: Diagnosis not present

## 2020-11-06 DIAGNOSIS — I421 Obstructive hypertrophic cardiomyopathy: Secondary | ICD-10-CM | POA: Diagnosis not present

## 2020-11-06 DIAGNOSIS — I130 Hypertensive heart and chronic kidney disease with heart failure and stage 1 through stage 4 chronic kidney disease, or unspecified chronic kidney disease: Secondary | ICD-10-CM | POA: Diagnosis not present

## 2020-11-06 DIAGNOSIS — I509 Heart failure, unspecified: Secondary | ICD-10-CM | POA: Diagnosis not present

## 2020-11-06 DIAGNOSIS — N183 Chronic kidney disease, stage 3 unspecified: Secondary | ICD-10-CM | POA: Diagnosis not present

## 2020-11-06 DIAGNOSIS — D631 Anemia in chronic kidney disease: Secondary | ICD-10-CM | POA: Diagnosis not present

## 2020-11-08 DIAGNOSIS — I509 Heart failure, unspecified: Secondary | ICD-10-CM | POA: Diagnosis not present

## 2020-11-08 DIAGNOSIS — N390 Urinary tract infection, site not specified: Secondary | ICD-10-CM | POA: Diagnosis not present

## 2020-11-08 DIAGNOSIS — F0390 Unspecified dementia without behavioral disturbance: Secondary | ICD-10-CM | POA: Diagnosis not present

## 2020-11-08 DIAGNOSIS — I10 Essential (primary) hypertension: Secondary | ICD-10-CM | POA: Diagnosis not present

## 2020-11-09 DIAGNOSIS — F0390 Unspecified dementia without behavioral disturbance: Secondary | ICD-10-CM | POA: Diagnosis not present

## 2020-11-09 DIAGNOSIS — I421 Obstructive hypertrophic cardiomyopathy: Secondary | ICD-10-CM | POA: Diagnosis not present

## 2020-11-09 DIAGNOSIS — I509 Heart failure, unspecified: Secondary | ICD-10-CM | POA: Diagnosis not present

## 2020-11-09 DIAGNOSIS — I130 Hypertensive heart and chronic kidney disease with heart failure and stage 1 through stage 4 chronic kidney disease, or unspecified chronic kidney disease: Secondary | ICD-10-CM | POA: Diagnosis not present

## 2020-11-09 DIAGNOSIS — N183 Chronic kidney disease, stage 3 unspecified: Secondary | ICD-10-CM | POA: Diagnosis not present

## 2020-11-09 DIAGNOSIS — D631 Anemia in chronic kidney disease: Secondary | ICD-10-CM | POA: Diagnosis not present

## 2020-11-10 DIAGNOSIS — F0390 Unspecified dementia without behavioral disturbance: Secondary | ICD-10-CM | POA: Diagnosis not present

## 2020-11-10 DIAGNOSIS — D631 Anemia in chronic kidney disease: Secondary | ICD-10-CM | POA: Diagnosis not present

## 2020-11-10 DIAGNOSIS — I509 Heart failure, unspecified: Secondary | ICD-10-CM | POA: Diagnosis not present

## 2020-11-10 DIAGNOSIS — I421 Obstructive hypertrophic cardiomyopathy: Secondary | ICD-10-CM | POA: Diagnosis not present

## 2020-11-10 DIAGNOSIS — N183 Chronic kidney disease, stage 3 unspecified: Secondary | ICD-10-CM | POA: Diagnosis not present

## 2020-11-10 DIAGNOSIS — I130 Hypertensive heart and chronic kidney disease with heart failure and stage 1 through stage 4 chronic kidney disease, or unspecified chronic kidney disease: Secondary | ICD-10-CM | POA: Diagnosis not present

## 2020-11-11 DIAGNOSIS — N183 Chronic kidney disease, stage 3 unspecified: Secondary | ICD-10-CM | POA: Diagnosis not present

## 2020-11-11 DIAGNOSIS — D631 Anemia in chronic kidney disease: Secondary | ICD-10-CM | POA: Diagnosis not present

## 2020-11-11 DIAGNOSIS — I421 Obstructive hypertrophic cardiomyopathy: Secondary | ICD-10-CM | POA: Diagnosis not present

## 2020-11-11 DIAGNOSIS — F0390 Unspecified dementia without behavioral disturbance: Secondary | ICD-10-CM | POA: Diagnosis not present

## 2020-11-11 DIAGNOSIS — I509 Heart failure, unspecified: Secondary | ICD-10-CM | POA: Diagnosis not present

## 2020-11-11 DIAGNOSIS — I130 Hypertensive heart and chronic kidney disease with heart failure and stage 1 through stage 4 chronic kidney disease, or unspecified chronic kidney disease: Secondary | ICD-10-CM | POA: Diagnosis not present

## 2020-11-16 DIAGNOSIS — D631 Anemia in chronic kidney disease: Secondary | ICD-10-CM | POA: Diagnosis not present

## 2020-11-16 DIAGNOSIS — I509 Heart failure, unspecified: Secondary | ICD-10-CM | POA: Diagnosis not present

## 2020-11-16 DIAGNOSIS — F0390 Unspecified dementia without behavioral disturbance: Secondary | ICD-10-CM | POA: Diagnosis not present

## 2020-11-16 DIAGNOSIS — I421 Obstructive hypertrophic cardiomyopathy: Secondary | ICD-10-CM | POA: Diagnosis not present

## 2020-11-16 DIAGNOSIS — I130 Hypertensive heart and chronic kidney disease with heart failure and stage 1 through stage 4 chronic kidney disease, or unspecified chronic kidney disease: Secondary | ICD-10-CM | POA: Diagnosis not present

## 2020-11-16 DIAGNOSIS — N183 Chronic kidney disease, stage 3 unspecified: Secondary | ICD-10-CM | POA: Diagnosis not present

## 2020-11-18 DIAGNOSIS — I421 Obstructive hypertrophic cardiomyopathy: Secondary | ICD-10-CM | POA: Diagnosis not present

## 2020-11-18 DIAGNOSIS — D631 Anemia in chronic kidney disease: Secondary | ICD-10-CM | POA: Diagnosis not present

## 2020-11-18 DIAGNOSIS — I509 Heart failure, unspecified: Secondary | ICD-10-CM | POA: Diagnosis not present

## 2020-11-18 DIAGNOSIS — N183 Chronic kidney disease, stage 3 unspecified: Secondary | ICD-10-CM | POA: Diagnosis not present

## 2020-11-18 DIAGNOSIS — F0390 Unspecified dementia without behavioral disturbance: Secondary | ICD-10-CM | POA: Diagnosis not present

## 2020-11-18 DIAGNOSIS — I130 Hypertensive heart and chronic kidney disease with heart failure and stage 1 through stage 4 chronic kidney disease, or unspecified chronic kidney disease: Secondary | ICD-10-CM | POA: Diagnosis not present

## 2020-11-19 DIAGNOSIS — N183 Chronic kidney disease, stage 3 unspecified: Secondary | ICD-10-CM | POA: Diagnosis not present

## 2020-11-19 DIAGNOSIS — I509 Heart failure, unspecified: Secondary | ICD-10-CM | POA: Diagnosis not present

## 2020-11-19 DIAGNOSIS — D631 Anemia in chronic kidney disease: Secondary | ICD-10-CM | POA: Diagnosis not present

## 2020-11-19 DIAGNOSIS — I130 Hypertensive heart and chronic kidney disease with heart failure and stage 1 through stage 4 chronic kidney disease, or unspecified chronic kidney disease: Secondary | ICD-10-CM | POA: Diagnosis not present

## 2020-11-19 DIAGNOSIS — I421 Obstructive hypertrophic cardiomyopathy: Secondary | ICD-10-CM | POA: Diagnosis not present

## 2020-11-19 DIAGNOSIS — F0390 Unspecified dementia without behavioral disturbance: Secondary | ICD-10-CM | POA: Diagnosis not present

## 2020-11-22 DIAGNOSIS — N183 Chronic kidney disease, stage 3 unspecified: Secondary | ICD-10-CM | POA: Diagnosis not present

## 2020-11-22 DIAGNOSIS — I509 Heart failure, unspecified: Secondary | ICD-10-CM | POA: Diagnosis not present

## 2020-11-22 DIAGNOSIS — F0390 Unspecified dementia without behavioral disturbance: Secondary | ICD-10-CM | POA: Diagnosis not present

## 2020-11-22 DIAGNOSIS — I421 Obstructive hypertrophic cardiomyopathy: Secondary | ICD-10-CM | POA: Diagnosis not present

## 2020-11-22 DIAGNOSIS — D631 Anemia in chronic kidney disease: Secondary | ICD-10-CM | POA: Diagnosis not present

## 2020-11-22 DIAGNOSIS — I130 Hypertensive heart and chronic kidney disease with heart failure and stage 1 through stage 4 chronic kidney disease, or unspecified chronic kidney disease: Secondary | ICD-10-CM | POA: Diagnosis not present

## 2020-11-23 DIAGNOSIS — I509 Heart failure, unspecified: Secondary | ICD-10-CM | POA: Diagnosis not present

## 2020-11-23 DIAGNOSIS — F0390 Unspecified dementia without behavioral disturbance: Secondary | ICD-10-CM | POA: Diagnosis not present

## 2020-11-23 DIAGNOSIS — N183 Chronic kidney disease, stage 3 unspecified: Secondary | ICD-10-CM | POA: Diagnosis not present

## 2020-11-23 DIAGNOSIS — I130 Hypertensive heart and chronic kidney disease with heart failure and stage 1 through stage 4 chronic kidney disease, or unspecified chronic kidney disease: Secondary | ICD-10-CM | POA: Diagnosis not present

## 2020-11-23 DIAGNOSIS — I421 Obstructive hypertrophic cardiomyopathy: Secondary | ICD-10-CM | POA: Diagnosis not present

## 2020-11-23 DIAGNOSIS — D631 Anemia in chronic kidney disease: Secondary | ICD-10-CM | POA: Diagnosis not present

## 2020-11-25 DIAGNOSIS — I509 Heart failure, unspecified: Secondary | ICD-10-CM | POA: Diagnosis not present

## 2020-11-25 DIAGNOSIS — I421 Obstructive hypertrophic cardiomyopathy: Secondary | ICD-10-CM | POA: Diagnosis not present

## 2020-11-25 DIAGNOSIS — I130 Hypertensive heart and chronic kidney disease with heart failure and stage 1 through stage 4 chronic kidney disease, or unspecified chronic kidney disease: Secondary | ICD-10-CM | POA: Diagnosis not present

## 2020-11-25 DIAGNOSIS — N183 Chronic kidney disease, stage 3 unspecified: Secondary | ICD-10-CM | POA: Diagnosis not present

## 2020-11-25 DIAGNOSIS — D631 Anemia in chronic kidney disease: Secondary | ICD-10-CM | POA: Diagnosis not present

## 2020-11-25 DIAGNOSIS — F0390 Unspecified dementia without behavioral disturbance: Secondary | ICD-10-CM | POA: Diagnosis not present

## 2020-11-26 DIAGNOSIS — I421 Obstructive hypertrophic cardiomyopathy: Secondary | ICD-10-CM | POA: Diagnosis not present

## 2020-11-26 DIAGNOSIS — D631 Anemia in chronic kidney disease: Secondary | ICD-10-CM | POA: Diagnosis not present

## 2020-11-26 DIAGNOSIS — F0390 Unspecified dementia without behavioral disturbance: Secondary | ICD-10-CM | POA: Diagnosis not present

## 2020-11-26 DIAGNOSIS — N183 Chronic kidney disease, stage 3 unspecified: Secondary | ICD-10-CM | POA: Diagnosis not present

## 2020-11-26 DIAGNOSIS — I509 Heart failure, unspecified: Secondary | ICD-10-CM | POA: Diagnosis not present

## 2020-11-26 DIAGNOSIS — I130 Hypertensive heart and chronic kidney disease with heart failure and stage 1 through stage 4 chronic kidney disease, or unspecified chronic kidney disease: Secondary | ICD-10-CM | POA: Diagnosis not present

## 2020-11-30 DIAGNOSIS — I421 Obstructive hypertrophic cardiomyopathy: Secondary | ICD-10-CM | POA: Diagnosis not present

## 2020-11-30 DIAGNOSIS — F0390 Unspecified dementia without behavioral disturbance: Secondary | ICD-10-CM | POA: Diagnosis not present

## 2020-11-30 DIAGNOSIS — I509 Heart failure, unspecified: Secondary | ICD-10-CM | POA: Diagnosis not present

## 2020-11-30 DIAGNOSIS — N183 Chronic kidney disease, stage 3 unspecified: Secondary | ICD-10-CM | POA: Diagnosis not present

## 2020-11-30 DIAGNOSIS — I130 Hypertensive heart and chronic kidney disease with heart failure and stage 1 through stage 4 chronic kidney disease, or unspecified chronic kidney disease: Secondary | ICD-10-CM | POA: Diagnosis not present

## 2020-11-30 DIAGNOSIS — N390 Urinary tract infection, site not specified: Secondary | ICD-10-CM | POA: Diagnosis not present

## 2020-11-30 DIAGNOSIS — D631 Anemia in chronic kidney disease: Secondary | ICD-10-CM | POA: Diagnosis not present

## 2020-12-01 DIAGNOSIS — F0390 Unspecified dementia without behavioral disturbance: Secondary | ICD-10-CM | POA: Diagnosis not present

## 2020-12-01 DIAGNOSIS — D631 Anemia in chronic kidney disease: Secondary | ICD-10-CM | POA: Diagnosis not present

## 2020-12-01 DIAGNOSIS — I509 Heart failure, unspecified: Secondary | ICD-10-CM | POA: Diagnosis not present

## 2020-12-01 DIAGNOSIS — I421 Obstructive hypertrophic cardiomyopathy: Secondary | ICD-10-CM | POA: Diagnosis not present

## 2020-12-01 DIAGNOSIS — N183 Chronic kidney disease, stage 3 unspecified: Secondary | ICD-10-CM | POA: Diagnosis not present

## 2020-12-01 DIAGNOSIS — I130 Hypertensive heart and chronic kidney disease with heart failure and stage 1 through stage 4 chronic kidney disease, or unspecified chronic kidney disease: Secondary | ICD-10-CM | POA: Diagnosis not present

## 2020-12-02 DIAGNOSIS — D631 Anemia in chronic kidney disease: Secondary | ICD-10-CM | POA: Diagnosis not present

## 2020-12-02 DIAGNOSIS — I509 Heart failure, unspecified: Secondary | ICD-10-CM | POA: Diagnosis not present

## 2020-12-02 DIAGNOSIS — I421 Obstructive hypertrophic cardiomyopathy: Secondary | ICD-10-CM | POA: Diagnosis not present

## 2020-12-02 DIAGNOSIS — I130 Hypertensive heart and chronic kidney disease with heart failure and stage 1 through stage 4 chronic kidney disease, or unspecified chronic kidney disease: Secondary | ICD-10-CM | POA: Diagnosis not present

## 2020-12-02 DIAGNOSIS — N183 Chronic kidney disease, stage 3 unspecified: Secondary | ICD-10-CM | POA: Diagnosis not present

## 2020-12-02 DIAGNOSIS — F0390 Unspecified dementia without behavioral disturbance: Secondary | ICD-10-CM | POA: Diagnosis not present

## 2020-12-03 DIAGNOSIS — I421 Obstructive hypertrophic cardiomyopathy: Secondary | ICD-10-CM | POA: Diagnosis not present

## 2020-12-03 DIAGNOSIS — I509 Heart failure, unspecified: Secondary | ICD-10-CM | POA: Diagnosis not present

## 2020-12-03 DIAGNOSIS — F0390 Unspecified dementia without behavioral disturbance: Secondary | ICD-10-CM | POA: Diagnosis not present

## 2020-12-03 DIAGNOSIS — R159 Full incontinence of feces: Secondary | ICD-10-CM | POA: Diagnosis not present

## 2020-12-03 DIAGNOSIS — R32 Unspecified urinary incontinence: Secondary | ICD-10-CM | POA: Diagnosis not present

## 2020-12-03 DIAGNOSIS — I700 Atherosclerosis of aorta: Secondary | ICD-10-CM | POA: Diagnosis not present

## 2020-12-03 DIAGNOSIS — D631 Anemia in chronic kidney disease: Secondary | ICD-10-CM | POA: Diagnosis not present

## 2020-12-03 DIAGNOSIS — N183 Chronic kidney disease, stage 3 unspecified: Secondary | ICD-10-CM | POA: Diagnosis not present

## 2020-12-03 DIAGNOSIS — Z6821 Body mass index (BMI) 21.0-21.9, adult: Secondary | ICD-10-CM | POA: Diagnosis not present

## 2020-12-03 DIAGNOSIS — Z87440 Personal history of urinary (tract) infections: Secondary | ICD-10-CM | POA: Diagnosis not present

## 2020-12-03 DIAGNOSIS — Z993 Dependence on wheelchair: Secondary | ICD-10-CM | POA: Diagnosis not present

## 2020-12-03 DIAGNOSIS — I130 Hypertensive heart and chronic kidney disease with heart failure and stage 1 through stage 4 chronic kidney disease, or unspecified chronic kidney disease: Secondary | ICD-10-CM | POA: Diagnosis not present

## 2020-12-03 DIAGNOSIS — N952 Postmenopausal atrophic vaginitis: Secondary | ICD-10-CM | POA: Diagnosis not present

## 2020-12-03 DIAGNOSIS — R627 Adult failure to thrive: Secondary | ICD-10-CM | POA: Diagnosis not present

## 2020-12-06 DIAGNOSIS — F0390 Unspecified dementia without behavioral disturbance: Secondary | ICD-10-CM | POA: Diagnosis not present

## 2020-12-06 DIAGNOSIS — I130 Hypertensive heart and chronic kidney disease with heart failure and stage 1 through stage 4 chronic kidney disease, or unspecified chronic kidney disease: Secondary | ICD-10-CM | POA: Diagnosis not present

## 2020-12-06 DIAGNOSIS — N183 Chronic kidney disease, stage 3 unspecified: Secondary | ICD-10-CM | POA: Diagnosis not present

## 2020-12-06 DIAGNOSIS — I509 Heart failure, unspecified: Secondary | ICD-10-CM | POA: Diagnosis not present

## 2020-12-06 DIAGNOSIS — I421 Obstructive hypertrophic cardiomyopathy: Secondary | ICD-10-CM | POA: Diagnosis not present

## 2020-12-06 DIAGNOSIS — D631 Anemia in chronic kidney disease: Secondary | ICD-10-CM | POA: Diagnosis not present

## 2020-12-07 DIAGNOSIS — N183 Chronic kidney disease, stage 3 unspecified: Secondary | ICD-10-CM | POA: Diagnosis not present

## 2020-12-07 DIAGNOSIS — D631 Anemia in chronic kidney disease: Secondary | ICD-10-CM | POA: Diagnosis not present

## 2020-12-07 DIAGNOSIS — F0390 Unspecified dementia without behavioral disturbance: Secondary | ICD-10-CM | POA: Diagnosis not present

## 2020-12-07 DIAGNOSIS — I421 Obstructive hypertrophic cardiomyopathy: Secondary | ICD-10-CM | POA: Diagnosis not present

## 2020-12-07 DIAGNOSIS — I509 Heart failure, unspecified: Secondary | ICD-10-CM | POA: Diagnosis not present

## 2020-12-07 DIAGNOSIS — I130 Hypertensive heart and chronic kidney disease with heart failure and stage 1 through stage 4 chronic kidney disease, or unspecified chronic kidney disease: Secondary | ICD-10-CM | POA: Diagnosis not present

## 2020-12-09 DIAGNOSIS — D631 Anemia in chronic kidney disease: Secondary | ICD-10-CM | POA: Diagnosis not present

## 2020-12-09 DIAGNOSIS — I421 Obstructive hypertrophic cardiomyopathy: Secondary | ICD-10-CM | POA: Diagnosis not present

## 2020-12-09 DIAGNOSIS — I509 Heart failure, unspecified: Secondary | ICD-10-CM | POA: Diagnosis not present

## 2020-12-09 DIAGNOSIS — I130 Hypertensive heart and chronic kidney disease with heart failure and stage 1 through stage 4 chronic kidney disease, or unspecified chronic kidney disease: Secondary | ICD-10-CM | POA: Diagnosis not present

## 2020-12-09 DIAGNOSIS — F0390 Unspecified dementia without behavioral disturbance: Secondary | ICD-10-CM | POA: Diagnosis not present

## 2020-12-09 DIAGNOSIS — N183 Chronic kidney disease, stage 3 unspecified: Secondary | ICD-10-CM | POA: Diagnosis not present

## 2020-12-10 DIAGNOSIS — I509 Heart failure, unspecified: Secondary | ICD-10-CM | POA: Diagnosis not present

## 2020-12-10 DIAGNOSIS — N183 Chronic kidney disease, stage 3 unspecified: Secondary | ICD-10-CM | POA: Diagnosis not present

## 2020-12-10 DIAGNOSIS — I130 Hypertensive heart and chronic kidney disease with heart failure and stage 1 through stage 4 chronic kidney disease, or unspecified chronic kidney disease: Secondary | ICD-10-CM | POA: Diagnosis not present

## 2020-12-10 DIAGNOSIS — F0390 Unspecified dementia without behavioral disturbance: Secondary | ICD-10-CM | POA: Diagnosis not present

## 2020-12-10 DIAGNOSIS — D631 Anemia in chronic kidney disease: Secondary | ICD-10-CM | POA: Diagnosis not present

## 2020-12-10 DIAGNOSIS — I421 Obstructive hypertrophic cardiomyopathy: Secondary | ICD-10-CM | POA: Diagnosis not present

## 2020-12-14 DIAGNOSIS — I130 Hypertensive heart and chronic kidney disease with heart failure and stage 1 through stage 4 chronic kidney disease, or unspecified chronic kidney disease: Secondary | ICD-10-CM | POA: Diagnosis not present

## 2020-12-14 DIAGNOSIS — I421 Obstructive hypertrophic cardiomyopathy: Secondary | ICD-10-CM | POA: Diagnosis not present

## 2020-12-14 DIAGNOSIS — I509 Heart failure, unspecified: Secondary | ICD-10-CM | POA: Diagnosis not present

## 2020-12-14 DIAGNOSIS — F0390 Unspecified dementia without behavioral disturbance: Secondary | ICD-10-CM | POA: Diagnosis not present

## 2020-12-14 DIAGNOSIS — D631 Anemia in chronic kidney disease: Secondary | ICD-10-CM | POA: Diagnosis not present

## 2020-12-14 DIAGNOSIS — N183 Chronic kidney disease, stage 3 unspecified: Secondary | ICD-10-CM | POA: Diagnosis not present

## 2020-12-15 DIAGNOSIS — I509 Heart failure, unspecified: Secondary | ICD-10-CM | POA: Diagnosis not present

## 2020-12-15 DIAGNOSIS — I130 Hypertensive heart and chronic kidney disease with heart failure and stage 1 through stage 4 chronic kidney disease, or unspecified chronic kidney disease: Secondary | ICD-10-CM | POA: Diagnosis not present

## 2020-12-15 DIAGNOSIS — D631 Anemia in chronic kidney disease: Secondary | ICD-10-CM | POA: Diagnosis not present

## 2020-12-15 DIAGNOSIS — F0390 Unspecified dementia without behavioral disturbance: Secondary | ICD-10-CM | POA: Diagnosis not present

## 2020-12-15 DIAGNOSIS — I421 Obstructive hypertrophic cardiomyopathy: Secondary | ICD-10-CM | POA: Diagnosis not present

## 2020-12-15 DIAGNOSIS — N183 Chronic kidney disease, stage 3 unspecified: Secondary | ICD-10-CM | POA: Diagnosis not present

## 2020-12-16 DIAGNOSIS — I421 Obstructive hypertrophic cardiomyopathy: Secondary | ICD-10-CM | POA: Diagnosis not present

## 2020-12-16 DIAGNOSIS — N183 Chronic kidney disease, stage 3 unspecified: Secondary | ICD-10-CM | POA: Diagnosis not present

## 2020-12-16 DIAGNOSIS — I509 Heart failure, unspecified: Secondary | ICD-10-CM | POA: Diagnosis not present

## 2020-12-16 DIAGNOSIS — D631 Anemia in chronic kidney disease: Secondary | ICD-10-CM | POA: Diagnosis not present

## 2020-12-16 DIAGNOSIS — F0390 Unspecified dementia without behavioral disturbance: Secondary | ICD-10-CM | POA: Diagnosis not present

## 2020-12-16 DIAGNOSIS — I130 Hypertensive heart and chronic kidney disease with heart failure and stage 1 through stage 4 chronic kidney disease, or unspecified chronic kidney disease: Secondary | ICD-10-CM | POA: Diagnosis not present

## 2020-12-20 DIAGNOSIS — I509 Heart failure, unspecified: Secondary | ICD-10-CM | POA: Diagnosis not present

## 2020-12-20 DIAGNOSIS — D631 Anemia in chronic kidney disease: Secondary | ICD-10-CM | POA: Diagnosis not present

## 2020-12-20 DIAGNOSIS — F0390 Unspecified dementia without behavioral disturbance: Secondary | ICD-10-CM | POA: Diagnosis not present

## 2020-12-20 DIAGNOSIS — I421 Obstructive hypertrophic cardiomyopathy: Secondary | ICD-10-CM | POA: Diagnosis not present

## 2020-12-20 DIAGNOSIS — I130 Hypertensive heart and chronic kidney disease with heart failure and stage 1 through stage 4 chronic kidney disease, or unspecified chronic kidney disease: Secondary | ICD-10-CM | POA: Diagnosis not present

## 2020-12-20 DIAGNOSIS — N183 Chronic kidney disease, stage 3 unspecified: Secondary | ICD-10-CM | POA: Diagnosis not present

## 2020-12-21 DIAGNOSIS — I421 Obstructive hypertrophic cardiomyopathy: Secondary | ICD-10-CM | POA: Diagnosis not present

## 2020-12-21 DIAGNOSIS — I509 Heart failure, unspecified: Secondary | ICD-10-CM | POA: Diagnosis not present

## 2020-12-21 DIAGNOSIS — F0390 Unspecified dementia without behavioral disturbance: Secondary | ICD-10-CM | POA: Diagnosis not present

## 2020-12-21 DIAGNOSIS — I130 Hypertensive heart and chronic kidney disease with heart failure and stage 1 through stage 4 chronic kidney disease, or unspecified chronic kidney disease: Secondary | ICD-10-CM | POA: Diagnosis not present

## 2020-12-21 DIAGNOSIS — N183 Chronic kidney disease, stage 3 unspecified: Secondary | ICD-10-CM | POA: Diagnosis not present

## 2020-12-21 DIAGNOSIS — D631 Anemia in chronic kidney disease: Secondary | ICD-10-CM | POA: Diagnosis not present

## 2020-12-23 DIAGNOSIS — F0390 Unspecified dementia without behavioral disturbance: Secondary | ICD-10-CM | POA: Diagnosis not present

## 2020-12-23 DIAGNOSIS — I421 Obstructive hypertrophic cardiomyopathy: Secondary | ICD-10-CM | POA: Diagnosis not present

## 2020-12-23 DIAGNOSIS — I130 Hypertensive heart and chronic kidney disease with heart failure and stage 1 through stage 4 chronic kidney disease, or unspecified chronic kidney disease: Secondary | ICD-10-CM | POA: Diagnosis not present

## 2020-12-23 DIAGNOSIS — D631 Anemia in chronic kidney disease: Secondary | ICD-10-CM | POA: Diagnosis not present

## 2020-12-23 DIAGNOSIS — N183 Chronic kidney disease, stage 3 unspecified: Secondary | ICD-10-CM | POA: Diagnosis not present

## 2020-12-23 DIAGNOSIS — I509 Heart failure, unspecified: Secondary | ICD-10-CM | POA: Diagnosis not present

## 2020-12-24 DIAGNOSIS — D631 Anemia in chronic kidney disease: Secondary | ICD-10-CM | POA: Diagnosis not present

## 2020-12-24 DIAGNOSIS — I130 Hypertensive heart and chronic kidney disease with heart failure and stage 1 through stage 4 chronic kidney disease, or unspecified chronic kidney disease: Secondary | ICD-10-CM | POA: Diagnosis not present

## 2020-12-24 DIAGNOSIS — I421 Obstructive hypertrophic cardiomyopathy: Secondary | ICD-10-CM | POA: Diagnosis not present

## 2020-12-24 DIAGNOSIS — F0390 Unspecified dementia without behavioral disturbance: Secondary | ICD-10-CM | POA: Diagnosis not present

## 2020-12-24 DIAGNOSIS — I509 Heart failure, unspecified: Secondary | ICD-10-CM | POA: Diagnosis not present

## 2020-12-24 DIAGNOSIS — N183 Chronic kidney disease, stage 3 unspecified: Secondary | ICD-10-CM | POA: Diagnosis not present

## 2020-12-27 DIAGNOSIS — N183 Chronic kidney disease, stage 3 unspecified: Secondary | ICD-10-CM | POA: Diagnosis not present

## 2020-12-27 DIAGNOSIS — I421 Obstructive hypertrophic cardiomyopathy: Secondary | ICD-10-CM | POA: Diagnosis not present

## 2020-12-27 DIAGNOSIS — I509 Heart failure, unspecified: Secondary | ICD-10-CM | POA: Diagnosis not present

## 2020-12-27 DIAGNOSIS — I130 Hypertensive heart and chronic kidney disease with heart failure and stage 1 through stage 4 chronic kidney disease, or unspecified chronic kidney disease: Secondary | ICD-10-CM | POA: Diagnosis not present

## 2020-12-27 DIAGNOSIS — D631 Anemia in chronic kidney disease: Secondary | ICD-10-CM | POA: Diagnosis not present

## 2020-12-27 DIAGNOSIS — F0390 Unspecified dementia without behavioral disturbance: Secondary | ICD-10-CM | POA: Diagnosis not present
